# Patient Record
Sex: Female | Born: 1965 | Race: Black or African American | Hispanic: No | Marital: Married | Smoking: Never smoker
Health system: Southern US, Community
[De-identification: ages and names within clinical notes are randomized; demographics above are authoritative.]

## PROBLEM LIST (undated history)

## (undated) DIAGNOSIS — G8929 Other chronic pain: Secondary | ICD-10-CM

## (undated) DIAGNOSIS — R232 Flushing: Secondary | ICD-10-CM

## (undated) DIAGNOSIS — R519 Headache, unspecified: Secondary | ICD-10-CM

## (undated) DIAGNOSIS — F419 Anxiety disorder, unspecified: Secondary | ICD-10-CM

## (undated) DIAGNOSIS — R5383 Other fatigue: Secondary | ICD-10-CM

## (undated) DIAGNOSIS — C50211 Malignant neoplasm of upper-inner quadrant of right female breast: Secondary | ICD-10-CM

## (undated) DIAGNOSIS — C50919 Malignant neoplasm of unspecified site of unspecified female breast: Secondary | ICD-10-CM

## (undated) DIAGNOSIS — F32A Depression, unspecified: Secondary | ICD-10-CM

## (undated) DIAGNOSIS — B379 Candidiasis, unspecified: Secondary | ICD-10-CM

## (undated) DIAGNOSIS — R51 Headache: Secondary | ICD-10-CM

## (undated) DIAGNOSIS — D649 Anemia, unspecified: Secondary | ICD-10-CM

## (undated) DIAGNOSIS — I10 Essential (primary) hypertension: Secondary | ICD-10-CM

## (undated) DIAGNOSIS — F329 Major depressive disorder, single episode, unspecified: Secondary | ICD-10-CM

## (undated) HISTORY — DX: Malignant neoplasm of upper-inner quadrant of right female breast: C50.211

## (undated) HISTORY — DX: Flushing: R23.2

## (undated) HISTORY — DX: Anxiety disorder, unspecified: F41.9

## (undated) HISTORY — DX: Other chronic pain: G89.29

## (undated) HISTORY — PX: GASTRIC BYPASS: SHX52

## (undated) HISTORY — DX: Candidiasis, unspecified: B37.9

## (undated) HISTORY — PX: TUBAL LIGATION: SHX77

## (undated) HISTORY — PX: MASTECTOMY W/ SENTINEL NODE BIOPSY: SHX2001

## (undated) HISTORY — DX: Anemia, unspecified: D64.9

## (undated) HISTORY — PX: COSMETIC SURGERY: SHX468

## (undated) HISTORY — DX: Headache: R51

## (undated) HISTORY — DX: Other fatigue: R53.83

## (undated) HISTORY — DX: Headache, unspecified: R51.9

## (undated) HISTORY — PX: CHOLECYSTECTOMY: SHX55

---

## 1998-08-07 ENCOUNTER — Emergency Department (HOSPITAL_COMMUNITY): Admission: EM | Admit: 1998-08-07 | Discharge: 1998-08-07 | Payer: Self-pay | Admitting: Emergency Medicine

## 1998-08-07 ENCOUNTER — Ambulatory Visit (HOSPITAL_COMMUNITY): Admission: RE | Admit: 1998-08-07 | Discharge: 1998-08-07 | Payer: Self-pay

## 1998-08-25 ENCOUNTER — Ambulatory Visit (HOSPITAL_COMMUNITY): Admission: RE | Admit: 1998-08-25 | Discharge: 1998-08-26 | Payer: Self-pay | Admitting: Surgery

## 2004-12-16 ENCOUNTER — Encounter: Admission: RE | Admit: 2004-12-16 | Discharge: 2004-12-16 | Payer: Self-pay | Admitting: Obstetrics and Gynecology

## 2005-01-11 ENCOUNTER — Encounter: Admission: RE | Admit: 2005-01-11 | Discharge: 2005-01-11 | Payer: Self-pay | Admitting: Obstetrics and Gynecology

## 2005-08-10 ENCOUNTER — Encounter: Admission: RE | Admit: 2005-08-10 | Discharge: 2005-08-10 | Payer: Self-pay | Admitting: Obstetrics and Gynecology

## 2007-03-20 ENCOUNTER — Encounter: Admission: RE | Admit: 2007-03-20 | Discharge: 2007-03-20 | Payer: Self-pay | Admitting: Obstetrics and Gynecology

## 2008-07-22 ENCOUNTER — Encounter: Admission: RE | Admit: 2008-07-22 | Discharge: 2008-07-22 | Payer: Self-pay | Admitting: Obstetrics and Gynecology

## 2008-08-20 ENCOUNTER — Encounter: Admission: RE | Admit: 2008-08-20 | Discharge: 2008-08-20 | Payer: Self-pay | Admitting: Gastroenterology

## 2009-07-23 ENCOUNTER — Encounter: Admission: RE | Admit: 2009-07-23 | Discharge: 2009-07-23 | Payer: Self-pay | Admitting: Obstetrics and Gynecology

## 2011-01-18 ENCOUNTER — Ambulatory Visit (INDEPENDENT_AMBULATORY_CARE_PROVIDER_SITE_OTHER): Payer: BC Managed Care – PPO

## 2011-01-18 DIAGNOSIS — R509 Fever, unspecified: Secondary | ICD-10-CM

## 2011-01-18 DIAGNOSIS — R05 Cough: Secondary | ICD-10-CM

## 2011-01-18 DIAGNOSIS — G44209 Tension-type headache, unspecified, not intractable: Secondary | ICD-10-CM

## 2011-09-26 ENCOUNTER — Ambulatory Visit (INDEPENDENT_AMBULATORY_CARE_PROVIDER_SITE_OTHER): Payer: BC Managed Care – PPO | Admitting: Family Medicine

## 2011-09-26 ENCOUNTER — Ambulatory Visit: Payer: BC Managed Care – PPO

## 2011-09-26 VITALS — BP 126/90 | HR 56 | Temp 97.8°F | Resp 16 | Ht 71.0 in | Wt 226.0 lb

## 2011-09-26 DIAGNOSIS — R519 Headache, unspecified: Secondary | ICD-10-CM

## 2011-09-26 DIAGNOSIS — R51 Headache: Secondary | ICD-10-CM

## 2011-09-26 MED ORDER — HYDROCODONE-ACETAMINOPHEN 5-325 MG PO TABS: 1.0000 | ORAL_TABLET | Freq: Four times a day (QID) | ORAL | Status: DC | PRN

## 2011-09-26 NOTE — Patient Instructions (Signed)
Your headache may be due to a number of causes including stress headache, migraine, sleep deprivation, or sinus disease.  We will have an x ray report in the next 1-2 days. You can take the pain medicine up to every 6 hours for the next few days.  This can make you sleepy, so do not take this while working or driving. Try to rest today, relaxation techniques for stress management.  If your headache is not improved in next 2 - 3 days, return for recheck and discussion of next step, including possible ct scan or neurology referral. Return to the clinic or go to the nearest emergency room if any of your symptoms worsen or new symptoms occur.

## 2011-09-26 NOTE — Progress Notes (Signed)
Subjective:    Patient ID: Brooke [name], female    DOB: [birth year], 46 y.o.   MRN: [id number]  HPI Brooke [name] is a 46 y.o. female Headaches - coming and going for past month. No history of headaches prior to this.  Worse on L side of face.  Current HA since yesterday.  Worse one. No vision change, no diplopia, no N/V.  Bright light and loud sound bother it.  No fever, no neck pain or stiffness.  No recent illness. Tx: advil otc. Min relief. Whole head hurts, but pain behind L eye - eye hurts.  No eye watering or runny nose.   More stress recently with work, kids, husband. No known hx of depression, anxiety.  Trouble getting to sleep and staying asleep, too many things on her mind, not getting peaceful sleep.  Tired all the time.    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  Social research officer, government at [employer]   Sister in law had tumor in brain - no genetic relatives with brain tumors.    Review of Systems  Constitutional: Negative for fever and chills.  HENT: Negative for congestion, sneezing, neck pain, neck stiffness and postnasal drip.   Eyes: Positive for photophobia and pain. Negative for discharge, redness and visual disturbance.  Respiratory: Negative for cough and shortness of breath.   Neurological: Positive for headaches. Negative for dizziness, facial asymmetry, speech difficulty and light-headedness.       Objective:   Physical Exam  Constitutional: She is oriented to person, place, and time. She appears well-developed and well-nourished.       Lights in room off.  Appears uncomfortable, but nontoxic.  HENT:  Head: Normocephalic and atraumatic.  Right Ear: External ear normal.  Left Ear: External ear normal.  Nose: Right sinus exhibits frontal sinus tenderness. Left sinus exhibits frontal sinus tenderness.       Guarded with percussion exam of sinuses, but only admits to L greater than R frontal ttp. No rash.  Eyes: EOM are normal. Pupils are equal, round, and reactive to  light. Right eye exhibits no nystagmus. Left eye exhibits no nystagmus.  Neck: Normal range of motion. Neck supple.  Cardiovascular: Normal rate, regular rhythm, normal heart sounds and intact distal pulses.   Pulmonary/Chest: Effort normal and breath sounds normal.  Musculoskeletal: Normal range of motion.  Neurological: She is alert and oriented to person, place, and time. She has normal strength. She displays no atrophy and no tremor. No sensory deficit. She exhibits normal muscle tone. She displays a negative Romberg sign. She displays no seizure activity. Coordination and gait normal.       No pronator drift. Normal finger to nose and heel to toe. Normal speech. No focal weakness.    Skin: Skin is warm and dry. No rash noted.  Psychiatric: She has a normal mood and affect. Her behavior is normal.     [facility] reading (PRIMARY) by  Dr. 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        Assessment & Plan:  Brooke [name] is a 46 y.o. female 1. Frontal headache  DG Sinuses Complete, HYDROcodone-acetaminophen (NORCO/VICODIN) 5-325 MG per tablet  2. Face pain      Headache likely multifactorial. Nonfoacal neuro exam.  DDX stress headache, sinus disease, cluster, or migraine.  Trial of pain med, sleep today, stress mgt techniques, and recheck next 2-3 days if not improved, sooner if worse. rtc precautions given, oow note for today. Discussed ct  scan, but deferred until follow up if not improved.   Patient Instructions  Your headache may be due to a number of causes including stress headache, migraine, sleep deprivation, or sinus disease.  We will have an x ray report in the next 1-2 days. You can take the pain medicine up to every 6 hours for the next few days.  This can make you sleepy, so do not take this while working or driving. Try to rest today, relaxation techniques for stress management.  If your headache is not improved in next 2 - 3 days, return for recheck and discussion of  next step, including possible ct scan or neurology referral. Return to the clinic or go to the nearest emergency room if any of your symptoms worsen or new symptoms occur.

## 2012-02-02 ENCOUNTER — Ambulatory Visit: Payer: BC Managed Care – PPO | Admitting: Obstetrics and Gynecology

## 2012-02-02 ENCOUNTER — Encounter: Payer: Self-pay | Admitting: Obstetrics and Gynecology

## 2012-02-02 VITALS — BP 122/70 | Ht 70.0 in | Wt 233.0 lb

## 2012-02-02 DIAGNOSIS — Z202 Contact with and (suspected) exposure to infections with a predominantly sexual mode of transmission: Secondary | ICD-10-CM

## 2012-02-02 DIAGNOSIS — G8929 Other chronic pain: Secondary | ICD-10-CM | POA: Insufficient documentation

## 2012-02-02 DIAGNOSIS — Z124 Encounter for screening for malignant neoplasm of cervix: Secondary | ICD-10-CM

## 2012-02-02 DIAGNOSIS — R5383 Other fatigue: Secondary | ICD-10-CM | POA: Insufficient documentation

## 2012-02-02 DIAGNOSIS — Z1231 Encounter for screening mammogram for malignant neoplasm of breast: Secondary | ICD-10-CM

## 2012-02-02 DIAGNOSIS — B379 Candidiasis, unspecified: Secondary | ICD-10-CM | POA: Insufficient documentation

## 2012-02-02 DIAGNOSIS — F53 Postpartum depression: Secondary | ICD-10-CM | POA: Insufficient documentation

## 2012-02-02 LAB — HEMOGLOBIN: Hemoglobin: 11.7 g/dL — ABNORMAL LOW (ref 12.0–15.0)

## 2012-02-02 NOTE — Progress Notes (Signed)
Subjective:  NEW PT AEX  Brooke [name] is a 47 y.o. female, G4P0010, who presents for an annual exam. She has had recurrent sx of vaginal itching always responds to OTC or prescribed antifungals.   Last Pap: per pt  2012 WNL: Yes Regular Periods:no  Contraception: Mirena  Monthly Breast exam:no Tetanus<75yrs:no Nl.Bladder Function:yes Daily BMs:no Healthy Diet:no Calcium:no Mammogram:no Date of Mammogram: 07/24/2009 Normal Exercise:no Have often Exercise: none Seatbelt: yes Abuse at home: no Stressful work:yes Sigmoid-colonoscopy: n/a Bone Density: No PCP: none Change in PMH: none  Change in ONG:EXBM   History   Social History  . Marital Status: Married    Spouse Name: N/A    Number of Children: N/A  . Years of Education: N/A   Social History Main Topics  . Smoking status: Never Smoker   . Smokeless tobacco: Never Used  . Alcohol Use: 2.0 - 2.5 oz/week    4-5 drink(s) per week  . Drug Use: No  . Sexually Active: Yes    Birth Control/ Protection: Surgical   Other Topics Concern  . None   Social History Narrative  . None    Menstrual cycle:   LMP: Patient's last menstrual period was 01/25/2012.           Cycle: regular without IM bleeding  The following portions of the patient's history were reviewed and updated as appropriate: allergies, current medications, past family history, past medical history, past social history, past surgical history and problem list.  Review of Systems Pertinent items are noted in HPI. Breast:Negative for breast lump,nipple discharge or nipple retraction Gastrointestinal: Negative for abdominal pain, change in bowel habits or rectal bleeding Urinary:negative   Objective:    BP 122/70  Ht 5\' 10"  (1.778 m)  Wt 233 lb (105.688 kg)  BMI 33.43 kg/m2  LMP 01/25/2012    Weight:  Wt Readings from Last 1 Encounters:  02/02/12 233 lb (105.688 kg)          BMI: Body mass index is 33.43 kg/(m^2).  General Appearance: Alert,  appropriate appearance for age. No acute distress HEENT: Grossly normal Neck / Thyroid: Supple, no masses, nodes or enlargement Lungs: clear to auscultation bilaterally Back: No CVA tenderness Breast Exam: No masses or nodes.No dimpling, nipple retraction or discharge. Cardiovascular: Regular rate and rhythm. S1, S2, no murmur Gastrointestinal: Soft, non-tender, no masses or organomegaly Pelvic Exam: Vulva and vagina appear normal. Bimanual exam reveals normal uterus and adnexa. Rectovaginal: normal rectal, no masses Lymphatic Exam: Non-palpable nodes in neck, clavicular, axillary, or inguinal regions Skin: no rash or abnormalities Neurologic: Normal gait and speech, no tremor  Psychiatric: Alert and oriented, appropriate affect.   Wet Prep:not applicable Urinalysis:not applicable UPT: Not done   Assessment:    Normal gyn exam  Requests STD testing History of recurrent vaginitis and asymptomatic at this time.   Plan:   Hemoglobin A1c Hemoglobin TSH mammogram pap smear return annually or prn STD screening: done Contraception:IUD     Dierdre Forth MD

## 2012-02-03 LAB — HSV 2 ANTIBODY, IGG: HSV 2 Glycoprotein G Ab, IgG: 7.51 IV — ABNORMAL HIGH

## 2012-02-03 LAB — HEMOGLOBIN A1C: Hgb A1c MFr Bld: 6.2 % — ABNORMAL HIGH (ref <5.7)

## 2012-02-03 LAB — HSV 1 ANTIBODY, IGG: HSV 1 Glycoprotein G Ab, IgG: 8.56 IV — ABNORMAL HIGH

## 2012-02-06 LAB — PAP IG, CT-NG NAA, HPV HIGH-RISK

## 2012-02-15 ENCOUNTER — Telehealth: Payer: Self-pay | Admitting: Obstetrics and Gynecology

## 2012-02-15 NOTE — Telephone Encounter (Signed)
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 This may explain recurrent yeast. Needs appt with PCP for plan of care    hemglobin (blood count, Iron level) essentially normal    Thyroid, pap smear and syphilis tests all normal

## 2012-02-15 NOTE — Telephone Encounter (Signed)
[facility] pt 

## 2012-02-17 MED ORDER — VALACYCLOVIR HCL 500 MG PO TABS: 500.0000 mg | ORAL_TABLET | Freq: Every day | ORAL | Status: AC

## 2012-02-17 NOTE — Telephone Encounter (Signed)
Tc from pt. Informed pt of [facility] recs rgdg labs. PCP information/phone numbers given to pt to establish care. 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 Rx for Valtrex e-pres to pharm on file. Pt voices understanding.

## 2012-02-17 NOTE — Telephone Encounter (Signed)
Lm on vm to cb per [facility] recs.  

## 2012-05-03 ENCOUNTER — Ambulatory Visit: Payer: BC Managed Care – PPO

## 2012-05-03 ENCOUNTER — Ambulatory Visit (INDEPENDENT_AMBULATORY_CARE_PROVIDER_SITE_OTHER): Payer: BC Managed Care – PPO | Admitting: Emergency Medicine

## 2012-05-03 VITALS — BP 108/73 | HR 84 | Temp 98.3°F | Resp 18 | Wt 229.0 lb

## 2012-05-03 DIAGNOSIS — R1013 Epigastric pain: Secondary | ICD-10-CM

## 2012-05-03 DIAGNOSIS — M25562 Pain in left knee: Secondary | ICD-10-CM

## 2012-05-03 DIAGNOSIS — M25569 Pain in unspecified knee: Secondary | ICD-10-CM

## 2012-05-03 LAB — POCT URINE PREGNANCY: Preg Test, Ur: NEGATIVE

## 2012-05-03 LAB — POCT UA - MICROSCOPIC ONLY
Mucus, UA: NEGATIVE
Yeast, UA: NEGATIVE

## 2012-05-03 LAB — POCT URINALYSIS DIPSTICK
Bilirubin, UA: NEGATIVE
Ketones, UA: NEGATIVE
Nitrite, UA: POSITIVE
Spec Grav, UA: 1.015

## 2012-05-03 NOTE — Progress Notes (Signed)
  Subjective:    Patient ID: Brooke [name], female    DOB: 1965/01/07, 47 y.o.   MRN: [id number]  HPI 47 yo female with complaints of uncomfortable feeling in stomach. Has changed diet to eat more salads and lean meat, but has been having constipation and straining with defecation. It is painful after bowel movement. Has history of roux-en-y surgery in 2004.  Bowels have never been regular. No family history of colon issues.   Also reports she is having trouble with both knees. Has been trying to run, but yesterday it hurt too badly.   Is not pregnant, tubal ligation 14 years ago.   Review of Systems  Gastrointestinal: Positive for constipation and rectal pain. Negative for diarrhea and blood in stool.  Musculoskeletal: Positive for arthralgias (Both knees ).  Skin: Positive for rash.       Objective:   Physical Exam  Constitutional: She appears well-developed and well-nourished.  HENT:  Head: Normocephalic and atraumatic.  Cardiovascular: Normal rate, regular rhythm and normal heart sounds.   Pulmonary/Chest: Effort normal and breath sounds normal.  Abdominal: Bowel sounds are normal. She exhibits mass (most likely stool).  Skin: Skin is warm and dry.  Occasional scaly plaque on back   [facility] reading (PRIMARY) by  Dr. Cleta Alberts There is a  large air accumulation in the left upper abdomen but I do not see any air-fluid levels and no free air and chest x-ray looks normal. Knee films shows degenerative changes but is otherwise within normal limits there is bilateral narrowing of the joint space.         Assessment & Plan:  I started her on MiraLax. Have encouraged her to pick up either Citrucel or Metamucil. Her x-ray of the left knee is bone-on-bone she should change her exercise program to non-impact bearing exercises.

## 2012-05-03 NOTE — Patient Instructions (Signed)
Degenerative Arthritis You have osteoarthritis. This is the wear and tear arthritis that comes with aging. It is also called degenerative arthritis. This is common in people past middle age. It is caused by stress on the joints. The large weight bearing joints of the lower extremities are most often affected. The knees, hips, back, neck, and hands can become painful, swollen, and stiff. This is the most common type of arthritis. It comes on with age, carrying too much weight, or from an injury. Treatment includes resting the sore joint until the pain and swelling improve. Crutches or a walker may be needed for severe flares. Only take over-the-counter or prescription medicines for pain, discomfort, or fever as directed by your caregiver. Local heat therapy may improve motion. Cortisone shots into the joint are sometimes used to reduce pain and swelling during flares. Osteoarthritis is usually not crippling and progresses slowly. There are things you can do to decrease pain:  Avoid high impact activities.  Exercise regularly.  Low impact exercises such as walking, biking and swimming help to keep the muscles strong and keep normal joint function.  Stretching helps to keep your range of motion.  Lose weight if you are overweight. This reduces joint stress. In severe cases when you have pain at rest or increasing disability, joint surgery may be helpful. See your caregiver for follow-up treatment as recommended.  SEEK IMMEDIATE MEDICAL CARE IF:   You have severe joint pain.  Marked swelling and redness in your joint develops.  You develop a high fever. Document Released: 12/20/2004 Document Revised: 03/14/2011 Document Reviewed: 05/22/2006 [facility] Patient Information 2013 [place], [place]. Constipation, Adult Constipation is when a person has fewer than 3 bowel movements a week; has difficulty having a bowel movement; or has stools that are dry, hard, or larger than normal. As people grow  older, constipation is more common. If you try to fix constipation with medicines that make you have a bowel movement (laxatives), the problem may get worse. Long-term laxative use may cause the muscles of the colon to become weak. A low-fiber diet, not taking in enough fluids, and taking certain medicines may make constipation worse. CAUSES   Certain medicines, such as antidepressants, pain medicine, iron supplements, antacids, and water pills.   Certain diseases, such as diabetes, irritable bowel syndrome (IBS), thyroid disease, or depression.   Not drinking enough water.   Not eating enough fiber-rich foods.   Stress or travel.  Lack of physical activity or exercise.  Not going to the restroom when there is the urge to have a bowel movement.  Ignoring the urge to have a bowel movement.  Using laxatives too much. SYMPTOMS   Having fewer than 3 bowel movements a week.   Straining to have a bowel movement.   Having hard, dry, or larger than normal stools.   Feeling full or bloated.   Pain in the lower abdomen.  Not feeling relief after having a bowel movement. DIAGNOSIS  Your caregiver will take a medical history and perform a physical exam. Further testing may be done for severe constipation. Some tests may include:   A barium enema X-ray to examine your rectum, colon, and sometimes, your small intestine.  A sigmoidoscopy to examine your lower colon.  A colonoscopy to examine your entire colon. TREATMENT  Treatment will depend on the severity of your constipation and what is causing it. Some dietary treatments include drinking more fluids and eating more fiber-rich foods. Lifestyle treatments may include regular exercise. If these  diet and lifestyle recommendations do not help, your caregiver may recommend taking over-the-counter laxative medicines to help you have bowel movements. Prescription medicines may be prescribed if over-the-counter medicines do not work.   HOME CARE INSTRUCTIONS   Increase dietary fiber in your diet, such as fruits, vegetables, whole grains, and beans. Limit high-fat and processed sugars in your diet, such as [place] fries, hamburgers, cookies, candies, and soda.   A fiber supplement may be added to your diet if you cannot get enough fiber from foods.   Drink enough fluids to keep your urine clear or pale yellow.   Exercise regularly or as directed by your caregiver.   Go to the restroom when you have the urge to go. Do not hold it.  Only take medicines as directed by your caregiver. Do not take other medicines for constipation without talking to your caregiver first. SEEK IMMEDIATE MEDICAL CARE IF:   You have bright red blood in your stool.   Your constipation lasts for more than 4 days or gets worse.   You have abdominal or rectal pain.   You have thin, pencil-like stools.  You have unexplained weight loss. MAKE SURE YOU:   Understand these instructions.  Will watch your condition.  Will get help right away if you are not doing well or get worse. Document Released: 09/18/2003 Document Revised: 03/14/2011 Document Reviewed: 11/23/2010 [facility] Patient Information 2013 [place], [place].

## 2012-06-04 ENCOUNTER — Ambulatory Visit (INDEPENDENT_AMBULATORY_CARE_PROVIDER_SITE_OTHER): Payer: BC Managed Care – PPO | Admitting: Family Medicine

## 2012-06-04 ENCOUNTER — Ambulatory Visit: Payer: BC Managed Care – PPO

## 2012-06-04 VITALS — BP 142/88 | HR 66 | Temp 98.3°F | Resp 16 | Ht 70.0 in | Wt 223.0 lb

## 2012-06-04 DIAGNOSIS — M25569 Pain in unspecified knee: Secondary | ICD-10-CM

## 2012-06-04 DIAGNOSIS — M25561 Pain in right knee: Secondary | ICD-10-CM

## 2012-06-04 MED ORDER — MELOXICAM 7.5 MG PO TABS: ORAL_TABLET | ORAL | Status: DC

## 2012-06-04 MED ORDER — TRAMADOL HCL 50 MG PO TABS: 50.0000 mg | ORAL_TABLET | Freq: Three times a day (TID) | ORAL | Status: DC | PRN

## 2012-06-04 NOTE — Progress Notes (Signed)
[facility] [address], [place] [address] [phone]  Date:  06/04/2012   Name:  Brooke [name]   DOB:  1965/04/16   MRN:  [id number]  PCP:  Pcp Not In System    Chief Complaint: Joint Swelling   History of Present Illness:  Brooke [name] is a 47 y.o. very pleasant female patient who presents with the following:  She is here today with a right knee injury.  She was exercising at the gym last week- she did not have any acute injury, but did notice pain while doing an exercise on a step.  That evening the knee was swollen.  She has used ice, and the knee is better but is still puffy and sore.  She has had some pain in the past- when she had started running- but lately it had been ok. No clicking of popping unless she steps down on it after sitting for a long time.   No getting stuck.  It can feel unstable, but has not yet given out on her.   LMP was last month- she is s/p BTL  Patient Active Problem List   Diagnosis Date Noted  . Depression, postpartum   . Yeast infection   . Fatigue   . Chronic headaches     Past Medical History  Diagnosis Date  . Anemia   . Depression, postpartum   . Yeast infection   . Fatigue   . Chronic headaches   . Anxiety     Past Surgical History  Procedure Laterality Date  . Gastric bypass    . Tubal ligation    . Cholecystectomy      History  Substance Use Topics  . Smoking status: Never Smoker   . Smokeless tobacco: Never Used  . Alcohol Use: 2 - 2.5 oz/week    4-5 drink(s) per week    Family History  Problem Relation Age of Onset  . Diabetes Mother   . Hypertension Mother   . Spinal muscular atrophy Mother   . Irregular heart beat Mother   . Anxiety disorder Mother   . Arthritis Mother   . Cancer Other     pancreatic    No Known Allergies  Medication list has been reviewed and updated.  Current Outpatient Prescriptions on File Prior to Visit  Medication Sig Dispense Refill  .  HYDROcodone-acetaminophen (NORCO/VICODIN) 5-325 MG per tablet Take 1 tablet by mouth every 6 (six) hours as needed for pain.  15 tablet  0   No current facility-administered medications on file prior to visit.    Review of Systems:  As per HPI- otherwise negative.   Physical Examination: Filed Vitals:   06/04/12 1244  BP: 142/88  Pulse: 66  Temp: 98.3 F (36.8 C)  Resp: 16   Filed Vitals:   06/04/12 1244  Height: 5\' 10"  (1.778 m)  Weight: 223 lb (101.152 kg)   Body mass index is 32 kg/(m^2). Ideal Body Weight: Weight in (lb) to have BMI = 25: 173.9  GEN: WDWN, NAD, Non-toxic, A & O x 3 HEENT: Atraumatic, Normocephalic. Neck supple. No masses, No LAD. Ears and Nose: No external deformity. CV: RRR, No M/G/R. No JVD. No thrill. No extra heart sounds. PULM: CTA B, no wheezes, crackles, rhonchi. No retractions. No resp. distress. No accessory muscle use. ABD: S, NT, ND, +BS. No rebound. No HSM. EXTR: No c/c/e NEURO Normal gait.  PSYCH: Normally interactive. Conversant. Not depressed or anxious appearing.  Calm demeanor.  Right knee: mild tenderness at the medial joint line, and a small effusion.  Ligaments are stable, no crepitus.  Good flexion and extension.    [facility] reading (PRIMARY) by  Dr. Patsy Lager. Right knee; mild degenerative change, especially under the patella   RIGHT KNEE - COMPLETE 4+ VIEW  Comparison: None.  Findings: Moderate degenerative changes are most evident in the medial and patellofemoral compartments of the knee. No acute osseous abnormality is evident. The knee is located. A moderate sized joint effusion is present.  IMPRESSION:  1. Moderate sized joint effusion without acute osseous abnormality. Internal derangement is not excluded. 2. Mild degenerative change is most evident in the medial and patellofemoral joints.  Clinically significant discrepancy from primary report, if provided: Moderate joint effusion.  These results will be called to  the ordering clinician or representative by the Radiologist Assistant, and communication documented in the PACS Dashboard.   Assessment and Plan: Pain in knee, right - Plan: DG Knee Complete 4 Views Right, meloxicam (MOBIC) 7.5 MG tablet, traMADol (ULTRAM) 50 MG tablet  Knee pain, ligaments are stable.  ?small meniscal tear vs sprain.  Will ice, elevate, use knee brace as needed mobic for day, tramadol for night as needed.  If not better in the next few days please let me know- Sooner if worse.  Called her and let her know that the O/R report does mention an effusion.  This increases the chance that we are dealing with a cartilage tear.  She will let me know if she is not better in a few days   Signed Abbe Amsterdam, MD

## 2012-06-04 NOTE — Patient Instructions (Addendum)
Use the knee brace as needed for support while walking.  Use the mobic as needed for pain and inflammation.  You can also use the tramadol, but it can make you sleepy.   Ice and elevated your knee as needed. If your knee is not better in about one week please give me a call

## 2013-05-03 ENCOUNTER — Ambulatory Visit (INDEPENDENT_AMBULATORY_CARE_PROVIDER_SITE_OTHER): Payer: BC Managed Care – PPO | Admitting: Family Medicine

## 2013-05-03 ENCOUNTER — Telehealth: Payer: Self-pay

## 2013-05-03 VITALS — BP 120/80 | HR 65 | Temp 98.7°F | Resp 16 | Ht 71.0 in | Wt 241.0 lb

## 2013-05-03 DIAGNOSIS — Z23 Encounter for immunization: Secondary | ICD-10-CM

## 2013-05-03 DIAGNOSIS — S61409A Unspecified open wound of unspecified hand, initial encounter: Secondary | ICD-10-CM

## 2013-05-03 DIAGNOSIS — M79609 Pain in unspecified limb: Secondary | ICD-10-CM

## 2013-05-03 DIAGNOSIS — M79643 Pain in unspecified hand: Secondary | ICD-10-CM

## 2013-05-03 MED ORDER — DICLOFENAC SODIUM 75 MG PO TBEC: 75.0000 mg | DELAYED_RELEASE_TABLET | Freq: Two times a day (BID) | ORAL | Status: DC

## 2013-05-03 NOTE — Progress Notes (Signed)
Subjective:    Patient ID: Brooke [name], female    DOB: Nov 25, 1965, 48 y.o.   MRN: [id number]  HPI Brooke [name] is a 48 y.o. female  Opening wine bottle last night at around 9pm with waiter's corkscrew.  Glass cut R third finger.  Rinsed and wrapped.  Tx: neosporin.   Last tetanus unknown, thinks may have been more than 5 years ago.   R hand dominant.   Press photographer at [employer].     Patient Active Problem List   Diagnosis Date Noted  . Depression, postpartum   . Yeast infection   . Fatigue   . Chronic headaches    Past Medical History  Diagnosis Date  . Anemia   . Depression, postpartum   . Yeast infection   . Fatigue   . Chronic headaches   . Anxiety    Past Surgical History  Procedure Laterality Date  . Gastric bypass    . Tubal ligation    . Cholecystectomy     No Known Allergies Prior to Admission medications   Medication Sig Start Date End Date Taking? Authorizing Provider  meloxicam (MOBIC) 7.5 MG tablet Take one or two daily for pain and inflammation 06/04/12   Gay Filler Copland, MD  traMADol (ULTRAM) 50 MG tablet Take 1 tablet (50 mg total) by mouth every 8 (eight) hours as needed for pain. 06/04/12   Darreld Mclean, MD   History   Social History  . Marital Status: Married    Spouse Name: N/A    Number of Children: N/A  . Years of Education: N/A   Occupational History  . Not on file.   Social History Main Topics  . Smoking status: Never Smoker   . Smokeless tobacco: Never Used  . Alcohol Use: 2.0 - 2.5 oz/week    4-5 drink(s) per week  . Drug Use: No  . Sexual Activity: Yes    Birth Control/ Protection: Surgical   Other Topics Concern  . Not on file   Social History Narrative  . No narrative on file    Review of Systems  Constitutional: Negative for fever.  Musculoskeletal: Positive for arthralgias. Negative for joint swelling.  Skin: Negative for color change.       Objective:   Physical Exam    Vitals reviewed. Constitutional: She is oriented to person, place, and time. She appears well-developed and well-nourished. No distress.  Pulmonary/Chest: Effort normal.  Musculoskeletal:       Right hand: She exhibits laceration (1.2CM LAC volar/pad of 3rd R phalynx. NVI distally, cap refill less than 1 second. ). She exhibits normal range of motion. Normal sensation noted. Normal strength noted.       Hands: Neurological: She is alert and oriented to person, place, and time.  Skin: Skin is warm and dry.  Psychiatric: She has a normal mood and affect. Her behavior is normal.   Filed Vitals:   05/03/13 0902  BP: 120/80  Pulse: 65  Temp: 98.7 F (37.1 C)  TempSrc: Oral  Resp: 16  Height: 5\' 11"  (1.803 m)  Weight: 241 lb (109.317 kg)  SpO2: 98%        Assessment & Plan:  Brooke [name] is a 48 y.o. female Pain, hand  Open wound, hand  Need for Tdap vaccination  Wound repaired per procedure note. Wound care precautions. Tdap updated. Tylenol or otc ibuprofen if needed for pain.   No orders of the defined  types were placed in this encounter.   Patient Instructions  Return to the clinic or go to the nearest emergency room if any of your symptoms worsen or new symptoms occur.  WOUND CARE Please return in 7-10 days to have your stitches/staples removed or sooner if you have concerns. Marland Kitchen Keep area clean and dry for 24 hours. Do not remove bandage, if applied. . After 24 hours, remove bandage and wash wound gently with mild soap and warm water. Reapply a new bandage after cleaning wound, if directed. . Continue daily cleansing with soap and water until stitches/staples are removed. . Do not apply any ointments or creams to the wound while stitches/staples are in place, as this may cause delayed healing. . Notify the office if you experience any of the following signs of infection: Swelling, redness, pus drainage, streaking, fever >101.0 F . Notify the office if you  experience excessive bleeding that does not stop after 15-20 minutes of constant, firm pressure.

## 2013-05-03 NOTE — Telephone Encounter (Signed)
I will send in a medication called Voltaren which will help the pain but not cause her to be sleepy.

## 2013-05-03 NOTE — Telephone Encounter (Signed)
Spoke to patient.  Voltaren called in and it should help with pain but not cause drowsiness.  Patient was very grateful.

## 2013-05-03 NOTE — Telephone Encounter (Signed)
Patient is requesting pain medication.  Her hand is throbbing from the procedure by Windell Hummingbird this morning.    [facility] [place]:  [phone]

## 2013-05-03 NOTE — Telephone Encounter (Signed)
LMVM to CB. 

## 2013-05-03 NOTE — Patient Instructions (Signed)
Return to the clinic or go to the nearest emergency room if any of your symptoms worsen or new symptoms occur.  WOUND CARE Please return in 7-10 days to have your stitches/staples removed or sooner if you have concerns. Marland Kitchen Keep area clean and dry for 24 hours. Do not remove bandage, if applied. . After 24 hours, remove bandage and wash wound gently with mild soap and warm water. Reapply a new bandage after cleaning wound, if directed. . Continue daily cleansing with soap and water until stitches/staples are removed. . Do not apply any ointments or creams to the wound while stitches/staples are in place, as this may cause delayed healing. . Notify the office if you experience any of the following signs of infection: Swelling, redness, pus drainage, streaking, fever >101.0 F . Notify the office if you experience excessive bleeding that does not stop after 15-20 minutes of constant, firm pressure.

## 2013-05-03 NOTE — Progress Notes (Signed)
Consent obtained.  MC block with 1% lidocaine.  Anesthesia achieved.  Wound cleaned and penrose drain used to achieve hemostasis.  Wound closed with 5-0 Ethilon #3 horizontal and #2 Si sutures.  Drsg placed and wound care d/w pt.

## 2013-05-13 ENCOUNTER — Ambulatory Visit (INDEPENDENT_AMBULATORY_CARE_PROVIDER_SITE_OTHER): Payer: BC Managed Care – PPO | Admitting: Physician Assistant

## 2013-05-13 VITALS — BP 120/74 | HR 76 | Temp 98.5°F | Resp 18

## 2013-05-13 DIAGNOSIS — Z4802 Encounter for removal of sutures: Secondary | ICD-10-CM

## 2013-05-13 NOTE — Progress Notes (Signed)
   Subjective:    Patient ID: Brooke [name], female    DOB: [birth year], 48 y.o.   MRN: [id number]  Suture / Staple Removal     Brooke [name] is a very pleasant 48 yr old female here for suture removal.  Sutures placed here 10 days ago.  Pt reports she is doing well.  Occ has some tenderness or throbbing if she bumps the finger. No redness, swelling, or drainage.     Review of Systems  Constitutional: Negative.   Respiratory: Negative.   Cardiovascular: Negative.   Skin: Positive for wound.       Objective:   Physical Exam  Vitals reviewed. Constitutional: She is oriented to person, place, and time. She appears well-developed and well-nourished. No distress.  Pulmonary/Chest: Effort normal.  Musculoskeletal:       Hands: Healing wound R 3rd finger; edges well approximated; sutures removed without difficulty  Neurological: She is alert and oriented to person, place, and time.  Skin: Skin is warm and dry.  Psychiatric: She has a normal mood and affect. Her behavior is normal.        Assessment & Plan:  Visit for suture removal   Brooke [name] is a very pleasant 48 yr old female here for suture removal.  Sutures removed without difficulty.  RTC if concerns    [name] MHS, PA-C [facility] 5/11/20156:07 PM

## 2014-01-27 ENCOUNTER — Ambulatory Visit (INDEPENDENT_AMBULATORY_CARE_PROVIDER_SITE_OTHER): Payer: BC Managed Care – PPO | Admitting: Family Medicine

## 2014-01-27 VITALS — BP 122/78 | HR 77 | Temp 97.7°F | Resp 18 | Ht 71.0 in | Wt 258.0 lb

## 2014-01-27 DIAGNOSIS — R059 Cough, unspecified: Secondary | ICD-10-CM

## 2014-01-27 DIAGNOSIS — H9203 Otalgia, bilateral: Secondary | ICD-10-CM

## 2014-01-27 DIAGNOSIS — J0100 Acute maxillary sinusitis, unspecified: Secondary | ICD-10-CM

## 2014-01-27 DIAGNOSIS — N912 Amenorrhea, unspecified: Secondary | ICD-10-CM

## 2014-01-27 DIAGNOSIS — R635 Abnormal weight gain: Secondary | ICD-10-CM

## 2014-01-27 DIAGNOSIS — R05 Cough: Secondary | ICD-10-CM

## 2014-01-27 DIAGNOSIS — E041 Nontoxic single thyroid nodule: Secondary | ICD-10-CM

## 2014-01-27 LAB — POCT URINE PREGNANCY: Preg Test, Ur: NEGATIVE

## 2014-01-27 MED ORDER — AMOXICILLIN-POT CLAVULANATE 875-125 MG PO TABS: 1.0000 | ORAL_TABLET | Freq: Two times a day (BID) | ORAL | Status: DC

## 2014-01-27 MED ORDER — HYDROCOD POLST-CHLORPHEN POLST 10-8 MG/5ML PO LQCR: 5.0000 mL | Freq: Two times a day (BID) | ORAL | Status: DC | PRN

## 2014-01-27 MED ORDER — FLUCONAZOLE 150 MG PO TABS: 150.0000 mg | ORAL_TABLET | Freq: Once | ORAL | Status: DC

## 2014-01-27 NOTE — Patient Instructions (Signed)
Return in 2 weeks to get Thyroid rechecked, if the thyr still feels full then we may need to get labs and also an ultrasound of your thyroid.     Sinusitis Sinusitis is redness, soreness, and inflammation of the paranasal sinuses. Paranasal sinuses are air pockets within the bones of your face (beneath the eyes, the middle of the forehead, or above the eyes). In healthy paranasal sinuses, mucus is able to drain out, and air is able to circulate through them by way of your nose. However, when your paranasal sinuses are inflamed, mucus and air can become trapped. This can allow bacteria and other germs to grow and cause infection. Sinusitis can develop quickly and last only a short time (acute) or continue over a long period (chronic). Sinusitis that lasts for more than 12 weeks is considered chronic.  CAUSES  Causes of sinusitis include:  Allergies.  Structural abnormalities, such as displacement of the cartilage that separates your nostrils (deviated septum), which can decrease the air flow through your nose and sinuses and affect sinus drainage.  Functional abnormalities, such as when the small hairs (cilia) that line your sinuses and help remove mucus do not work properly or are not present. SIGNS AND SYMPTOMS  Symptoms of acute and chronic sinusitis are the same. The primary symptoms are pain and pressure around the affected sinuses. Other symptoms include:  Upper toothache.  Earache.  Headache.  Bad breath.  Decreased sense of smell and taste.  A cough, which worsens when you are lying flat.  Fatigue.  Fever.  Thick drainage from your nose, which often is green and may contain pus (purulent).  Swelling and warmth over the affected sinuses. DIAGNOSIS  Your health care provider will perform a physical exam. During the exam, your health care provider may:  Look in your nose for signs of abnormal growths in your nostrils (nasal polyps).  Tap over the affected sinus to  check for signs of infection.  View the inside of your sinuses (endoscopy) using an imaging device that has a light attached (endoscope). If your health care provider suspects that you have chronic sinusitis, one or more of the following tests may be recommended:  Allergy tests.  Nasal culture. A sample of mucus is taken from your nose, sent to a lab, and screened for bacteria.  Nasal cytology. A sample of mucus is taken from your nose and examined by your health care provider to determine if your sinusitis is related to an allergy. TREATMENT  Most cases of acute sinusitis are related to a viral infection and will resolve on their own within 10 days. Sometimes medicines are prescribed to help relieve symptoms (pain medicine, decongestants, nasal steroid sprays, or saline sprays).  However, for sinusitis related to a bacterial infection, your health care provider will prescribe antibiotic medicines. These are medicines that will help kill the bacteria causing the infection.  Rarely, sinusitis is caused by a fungal infection. In theses cases, your health care provider will prescribe antifungal medicine. For some cases of chronic sinusitis, surgery is needed. Generally, these are cases in which sinusitis recurs more than 3 times per year, despite other treatments. HOME CARE INSTRUCTIONS   Drink plenty of water. Water helps thin the mucus so your sinuses can drain more easily.  Use a humidifier.  Inhale steam 3 to 4 times a day (for example, sit in the bathroom with the shower running).  Apply a warm, moist washcloth to your face 3 to 4 times a  day, or as directed by your health care provider.  Use saline nasal sprays to help moisten and clean your sinuses.  Take medicines only as directed by your health care provider.  If you were prescribed either an antibiotic or antifungal medicine, finish it all even if you start to feel better. SEEK IMMEDIATE MEDICAL CARE IF:  You have increasing  pain or severe headaches.  You have nausea, vomiting, or drowsiness.  You have swelling around your face.  You have vision problems.  You have a stiff neck.  You have difficulty breathing. MAKE SURE YOU:   Understand these instructions.  Will watch your condition.  Will get help right away if you are not doing well or get worse. Document Released: 12/20/2004 Document Revised: 05/06/2013 Document Reviewed: 01/04/2011 [facility] Patient Information 2015 [place], [place]. This information is not intended to replace advice given to you by your health care provider. Make sure you discuss any questions you have with your health care provider.  Lorcaserin oral tablets What is this medicine? LORCASERIN (lor ca SER in) is used to promote and maintain weight loss in obese patients. This medicine should be used with a reduced calorie diet and, if appropriate, an exercise program. This medicine may be used for other purposes; ask your health care provider or pharmacist if you have questions. COMMON BRAND NAME(S): Belviq What should I tell my health care provider before I take this medicine? They need to know if you have any of these conditions: -anatomical deformation of the penis, Peyronie's disease, or history of priapism (painful and prolonged erection) -diabetes -heart disease -history of blood diseases, like sickle cell anemia or leukemia -history of irregular heartbeat -kidney disease -liver disease -suicidal thoughts, plans, or attempt; a previous suicide attempt by you or a family member -an unusual or allergic reaction to lorcaserin, other medicines, foods, dyes, or preservatives -pregnant or trying to get pregnant -breast-feeding How should I use this medicine? Take this medicine by mouth with a glass of water. Follow the directions on the prescription label. You can take it with or without food. Take your medicine at regular intervals. Do not take it more often than directed. Do  not stop taking except on your doctor's advice. Talk to your pediatrician regarding the use of this medicine in children. Special care may be needed. Overdosage: If you think you've taken too much of this medicine contact a poison control center or emergency room at once. Overdosage: If you think you have taken too much of this medicine contact a poison control center or emergency room at once. NOTE: This medicine is only for you. Do not share this medicine with others. What if I miss a dose? If you miss a dose, take it as soon as you can. If it is almost time for your next dose, take only that dose. Do not take double or extra doses. What may interact with this medicine? -cabergoline -certain medicines for depression, anxiety, or psychotic disturbances -certain medicines for erectile dysfunction -certain medicines for migraine headache like almotriptan, eletriptan, frovatriptan, naratriptan, rizatriptan, sumatriptan, zolmitriptan -dextromethorphan -linezolid -MAOIs like Carbex, Eldepryl, Marplan, Nardil, and Parnate -medicines for diabetes -orlistat -tramadol -St. John's Wort -stimulant medicines for attention disorders, weight loss, or to stay awake This list may not describe all possible interactions. Give your health care provider a list of all the medicines, herbs, non-prescription drugs, or dietary supplements you use. Also tell them if you smoke, drink alcohol, or use illegal drugs. Some items may interact with  your medicine. What should I watch for while using this medicine? This medicine is intended to be used in addition to a healthy diet and appropriate exercise. The best results are achieved this way. Do not increase or in any way change your dose without consulting your doctor or health care professional. Your doctor should tell you to stop taking this medicine if you do not lose a certain amount of weight within the first 12 weeks of treatment. Visit your doctor or health care  professional for regular checkups. Your doctor may order blood tests or other tests to see how you are doing. Do not drive, use machinery, or do anything that needs mental alertness until you know how this medicine affects you. This medicine may affect blood sugar levels. If you have diabetes, check with your doctor or health care professional before you change your diet or the dose of your diabetic medicine. Patients and their families should watch out for worsening depression or thoughts of suicide. Also watch out for sudden changes in feelings such as feeling anxious, agitated, panicky, irritable, hostile, aggressive, impulsive, severely restless, overly excited and hyperactive, or not being able to sleep. If this happens, especially at the beginning of treatment or after a change in dose, call your health care professional. Contact you doctor or health care professional right away if the erection lasts longer than 4 hours or if it becomes painful. This may be a sign of serious problem and must be treated right away to prevent permanent damage. What side effects may I notice from receiving this medicine? Side effects that you should report to your doctor or health care professional as soon as possible: -allergic reactions like skin rash, itching or hives, swelling of the face, lips, or tongue -abnormal production of milk -breast enlargement in both males and females -breathing problems -changes in emotions or moods -changes in vision -confusion -erection lasting more than 4 hours -fast or irregular heart beat -feeling faint or lightheaded, falls -fever or chills, sore throat -hallucination, loss of contact with reality -high or low blood pressure -menstrual changes -restlessness -seizures -slow or irregular heartbeat -stiff muscles -sweating -suicidal thoughts or other mood changes -tremors -trouble walking -unusually weak or tired -vomiting Side effects that usually do not require  medical attention (Report these to your doctor or health care professional if they continue or are bothersome.): -back pain -constipation -cough -dizziness -dry mouth -low blood sugar if you have diabetes (ask your doctor or healthcare professional for a list of these symptoms) -nausea -tiredness This list may not describe all possible side effects. Call your doctor for medical advice about side effects. You may report side effects to FDA at 1-800-FDA-1088. Where should I keep my medicine? Keep out of the reach of children. Store at room temperature between 15 and 30 degrees C (59 and 86 degrees F). Throw away any unused medicine after the expiration date. NOTE: This sheet is a summary. It may not cover all possible information. If you have questions about this medicine, talk to your doctor, pharmacist, or health care provider.  2015, Elsevier/Gold Standard. (2010-07-06 17:15:17)

## 2014-01-27 NOTE — Progress Notes (Signed)
Chief Complaint:  Chief Complaint  Patient presents with  . Cough    Non-productive x1 week- no relief with OTC meds  . Headache  . Nasal Congestion  . Generalized Body Aches    HPI: Brooke [name] is a 49 y.o. female who is here for  1 week histry of cough, headache with cough and tiredenss , and nasal congestion, she is tired and also has tried everything to feel better, She has subjective warmth and chills She has tried otc meds without relief.  Additionally she is always stired and ahs weight loss, she has gotten a trainer, is eatingbetter, she ahs been doing this for 1 month but she is not understanding why she has had weight gain even when is cutting down her food intake and exercising, seh wants to know if she can be on a weight loss stimulant.  She is always tired. Does not how much sleep she gets.  She has a hx of depression, gastric bypass  Wt Readings from Last 3 Encounters:  01/27/14 258 lb (117.028 kg)  05/03/13 241 lb (109.317 kg)  06/04/12 223 lb (101.152 kg)     Past Medical History  Diagnosis Date  . Anemia   . Depression, postpartum   . Yeast infection   . Fatigue   . Chronic headaches   . Anxiety    Past Surgical History  Procedure Laterality Date  . Gastric bypass    . Tubal ligation    . Cholecystectomy     History   Social History  . Marital Status: Married    Spouse Name: N/A    Number of Children: N/A  . Years of Education: N/A   Social History Main Topics  . Smoking status: Never Smoker   . Smokeless tobacco: Never Used  . Alcohol Use: 2.0 - 2.5 oz/week    4-5 drink(s) per week  . Drug Use: No  . Sexual Activity: Yes    Birth Control/ Protection: Surgical   Other Topics Concern  . None   Social History Narrative   Family History  Problem Relation Age of Onset  . Diabetes Mother   . Hypertension Mother   . Spinal muscular atrophy Mother   . Irregular heart beat Mother   . Anxiety disorder Mother   . Arthritis Mother     . Cancer Other     pancreatic   No Known Allergies Prior to Admission medications   Medication Sig Start Date End Date Taking? Authorizing Provider  diclofenac (VOLTAREN) 75 MG EC tablet Take 1 tablet (75 mg total) by mouth 2 (two) times daily. Patient not taking: Reported on 01/27/2014 05/03/13   Mancel Bale, PA-C  meloxicam (MOBIC) 7.5 MG tablet Take one or two daily for pain and inflammation Patient not taking: Reported on 01/27/2014 06/04/12   Darreld Mclean, MD  traMADol (ULTRAM) 50 MG tablet Take 1 tablet (50 mg total) by mouth every 8 (eight) hours as needed for pain. Patient not taking: Reported on 01/27/2014 06/04/12   Darreld Mclean, MD     ROS: The patient denies unintentional weight loss, chest pain, palpitations, wheezing, dyspnea on exertion, nausea, vomiting, abdominal pain, dysuria, hematuria, melena, numbness,  or tingling.   All other systems have been reviewed and were otherwise negative with the exception of those mentioned in the HPI and as above.    PHYSICAL EXAM: Filed Vitals:   01/27/14 1030  BP: 122/78  Pulse: 77  Temp:  97.7 F (36.5 C)  Resp: 18   Filed Vitals:   01/27/14 1030  Height: 5\' 11"  (1.803 m)  Weight: 258 lb (117.028 kg)   Body mass index is 36 kg/(m^2).  General: Alert, no acute distress HEENT:  Normocephalic, atraumatic, oropharynx patent. EOMI, PERRLA + right thyroid nodule vs LAD Erythematous throat, no exudates, TM normal, + sinus tenderness, + erythematous/boggy nasal mucosa Cardiovascular:  Regular rate and rhythm, no rubs murmurs or gallops.  No Carotid bruits, radial pulse intact. No pedal edema.  Respiratory: Clear to auscultation bilaterally.  No wheezes, rales, or rhonchi.  No cyanosis, no use of accessory musculature GI: No organomegaly, abdomen is soft and non-tender, positive bowel sounds.  No masses. Skin: No rashes. Neurologic: Facial musculature symmetric. Psychiatric: Patient is appropriate throughout our  interaction. Lymphatic: No cervical lymphadenopathy Musculoskeletal: Gait intact.   LABS: Results for orders placed or performed in visit on 01/27/14  POCT urine pregnancy  Result Value Ref Range   Preg Test, Ur Negative      EKG/XRAY:   Primary read interpreted by Dr. Marin Comment at [facility].   ASSESSMENT/PLAN: Encounter Diagnoses  Name Primary?  . Acute maxillary sinusitis, recurrence not specified Yes  . Otalgia, bilateral   . Cough   . Weight gain   . Thyroid nodule   . Amenorrhea    Rx Augmentin, tussionex, nasacort, diflucan for yeast prn, delsyn F/u in 2 weeks for thyroid nodule. IF her URI sxs resolve and nodule is still present I would consider a scan to rule out thyroid goiter ro something different. Willget TSh at that time, i prefer her not to be acutely sick before getting TSh and also to see if nodule resolves Gave info about Blevique but she really needs to jsut watch what she eats and also exercise, has a h/o gsatric bypass   Gross sideeffects, risk and benefits, and alternatives of medications d/w patient. Patient is aware that all medications have potential sideeffects and we are unable to predict every sideeffect or drug-drug interaction that may occur.  Kyley Laurel, [place], DO 01/27/2014 11:09 AM

## 2014-01-29 ENCOUNTER — Telehealth: Payer: Self-pay

## 2014-01-29 NOTE — Telephone Encounter (Signed)
Yes , she can be out the rest of the week.

## 2014-01-29 NOTE — Telephone Encounter (Signed)
Work note is ready to pick up. Pt called and she was notified.

## 2014-01-29 NOTE — Telephone Encounter (Signed)
Pt was seen by Dr.Le and is still feeling bad, would like to have an oow note for today thru Friday. Please call pt at [phone], will need to know by today so she can call her job

## 2014-01-29 NOTE — Telephone Encounter (Signed)
Is this ok Dr. Marin Comment?

## 2014-02-17 ENCOUNTER — Encounter: Payer: Self-pay | Admitting: Family Medicine

## 2014-03-14 ENCOUNTER — Encounter: Payer: Self-pay | Admitting: Family Medicine

## 2014-03-14 ENCOUNTER — Ambulatory Visit (INDEPENDENT_AMBULATORY_CARE_PROVIDER_SITE_OTHER): Payer: BC Managed Care – PPO | Admitting: Family Medicine

## 2014-03-14 ENCOUNTER — Other Ambulatory Visit: Payer: Self-pay | Admitting: Family Medicine

## 2014-03-14 VITALS — BP 136/84 | HR 69 | Temp 98.0°F | Resp 16 | Ht 71.0 in | Wt 254.0 lb

## 2014-03-14 DIAGNOSIS — G4700 Insomnia, unspecified: Secondary | ICD-10-CM | POA: Diagnosis not present

## 2014-03-14 DIAGNOSIS — N951 Menopausal and female climacteric states: Secondary | ICD-10-CM

## 2014-03-14 DIAGNOSIS — Z566 Other physical and mental strain related to work: Secondary | ICD-10-CM

## 2014-03-14 DIAGNOSIS — E041 Nontoxic single thyroid nodule: Secondary | ICD-10-CM | POA: Diagnosis not present

## 2014-03-14 DIAGNOSIS — R635 Abnormal weight gain: Secondary | ICD-10-CM | POA: Diagnosis not present

## 2014-03-14 LAB — COMPLETE METABOLIC PANEL WITHOUT GFR
ALT: 19 U/L (ref 0–35)
AST: 22 U/L (ref 0–37)
Albumin: 3.9 g/dL (ref 3.5–5.2)
Alkaline Phosphatase: 51 U/L (ref 39–117)
GFR, Est Non African American: >89 mL/min
Potassium: 4.1 meq/L (ref 3.5–5.3)
Sodium: 134 meq/L — ABNORMAL LOW (ref 135–145)
Total Protein: 6.8 g/dL (ref 6.0–8.3)

## 2014-03-14 LAB — CBC
HCT: 37.5 % (ref 36.0–46.0)
Hemoglobin: 12.1 g/dL (ref 12.0–15.0)
MCH: 31.5 pg (ref 26.0–34.0)
MCHC: 32.3 g/dL (ref 30.0–36.0)
MCV: 97.7 fL (ref 78.0–100.0)
MPV: 9.6 fL (ref 8.6–12.4)
Platelets: 341 10*3/uL (ref 150–400)
RBC: 3.84 MIL/uL — ABNORMAL LOW (ref 3.87–5.11)
RDW: 13.5 % (ref 11.5–15.5)
WBC: 5.4 10*3/uL (ref 4.0–10.5)

## 2014-03-14 LAB — COMPLETE METABOLIC PANEL WITH GFR
BUN: 9 mg/dL (ref 6–23)
CO2: 24 mEq/L (ref 19–32)
Calcium: 8.7 mg/dL (ref 8.4–10.5)
Chloride: 101 mEq/L (ref 96–112)
Creat: 0.66 mg/dL (ref 0.50–1.10)
GFR, Est African American: >89 mL/min
Glucose, Bld: 81 mg/dL (ref 70–99)
Total Bilirubin: 0.4 mg/dL (ref 0.2–1.2)

## 2014-03-14 LAB — T4, FREE: Free T4: 0.92 ng/dL (ref 0.80–1.80)

## 2014-03-14 LAB — TSH: TSH: 0.6 u[IU]/mL (ref 0.350–4.500)

## 2014-03-14 LAB — T3, FREE: T3, Free: 2.5 pg/mL (ref 2.3–4.2)

## 2014-03-14 MED ORDER — SERTRALINE HCL 25 MG PO TABS: 25.0000 mg | ORAL_TABLET | Freq: Every day | ORAL | Status: DC

## 2014-03-14 MED ORDER — CLONAZEPAM 0.5 MG PO TABS: 0.5000 mg | ORAL_TABLET | Freq: Two times a day (BID) | ORAL | Status: DC | PRN

## 2014-03-14 NOTE — Progress Notes (Signed)
Chief Complaint:  Chief Complaint  Patient presents with  . Follow-up  . Thyroid Problem    HPI: Brooke [name] is a 49 y.o. female who is here for thyroid nodule follow up  After her acute upper respiratory illness. She denies any prior history of thyroid disease.  He is a Pharmacist, hospital , teaching fifth grade currently. She feels that she has worsening anxiety and depression. She does not want to go to work. She dreads going to work. She has been teaching for 11 years and she's never felt this  a. This class has  been very tough for her.  She has been more emotional. He is not sleeping well. She wants to handle her anxiety. He denies any suicidal, homicidal   Thoughts or hallucinations.  She had postpartum depression.  she also is concerned that her weight gain is not well controlled. Family history of obesity and she used to be obese and she does not want to be obese.   She has had gastric bypass.She wants me togive her some weight loss medications.  She has been working out in watching what she eats, cutting carbohydrates so she is not understanding why she is not losing more weight. She has done this before without any problems. She has been without a menstrual cycle consistently this past year. She can go 2-3 months without a cycle. He also complains of  Easy bruising.   BP Readings from Last 3 Encounters:  03/14/14 136/84  01/27/14 122/78  05/13/13 120/74   Wt Readings from Last 3 Encounters:  03/14/14 254 lb (115.214 kg)  01/27/14 258 lb (117.028 kg)  05/03/13 241 lb (109.317 kg)      Past Medical History  Diagnosis Date  . Anemia   . Depression, postpartum   . Yeast infection   . Fatigue   . Chronic headaches   . Anxiety    Past Surgical History  Procedure Laterality Date  . Gastric bypass    . Tubal ligation    . Cholecystectomy     History   Social History  . Marital Status: Married    Spouse Name: N/A  . Number of Children: N/A  . Years of Education: N/A    Social History Main Topics  . Smoking status: Never Smoker   . Smokeless tobacco: Never Used  . Alcohol Use: 2.0 - 2.5 oz/week    4-5 drink(s) per week  . Drug Use: No  . Sexual Activity: Yes    Birth Control/ Protection: Surgical   Other Topics Concern  . None   Social History Narrative   Family History  Problem Relation Age of Onset  . Diabetes Mother   . Hypertension Mother   . Spinal muscular atrophy Mother   . Irregular heart beat Mother   . Anxiety disorder Mother   . Arthritis Mother   . Cancer Other     pancreatic   No Known Allergies Prior to Admission medications   Not on File     ROS: The patient denies fevers, chills, night sweats, unintentional weight loss, chest pain, palpitations, wheezing, dyspnea on exertion, nausea, vomiting, abdominal pain, dysuria, hematuria, melena, numbness, weakness, or tingling.   All other systems have been reviewed and were otherwise negative with the exception of those mentioned in the HPI and as above.    PHYSICAL EXAM: Filed Vitals:   03/14/14 1236  BP: 136/84  Pulse: 69  Temp: 98 F (36.7 C)  Resp: 16  Filed Vitals:   03/14/14 1236  Height: 5\' 11"  (1.803 m)  Weight: 254 lb (115.214 kg)   Body mass index is 35.44 kg/(m^2).  General: Alert,  Obese African-American female, anxious HEENT:  Normocephalic, atraumatic, oropharynx patent. EOMI, PERRLA,  Left thyroid nodule Cardiovascular:  Regular rate and rhythm, no rubs murmurs or gallops.  No Carotid bruits, radial pulse intact. No pedal edema.  Respiratory: Clear to auscultation bilaterally.  No wheezes, rales, or rhonchi.  No cyanosis, no use of accessory musculature GI: No organomegaly, abdomen is soft and non-tender, positive bowel sounds.  No masses. Skin: No rashes.  Positive one area of ecchymosis, 0.5 cm x 0.5 cm in diameter on her right forearm. No oral mucosal bleeding. No ecchymosis in the mucosal area. Neurologic: Facial musculature  symmetric. Psychiatric: Patient is appropriate throughout our interaction. Lymphatic: No cervical lymphadenopathy Musculoskeletal: Gait intact.   LABS: Results for orders placed or performed in visit on 01/27/14  POCT urine pregnancy  Result Value Ref Range   Preg Test, Ur Negative      EKG/XRAY:   Primary read interpreted by Dr. Marin [name] at [facility].   ASSESSMENT/PLAN: Encounter Diagnoses  Name Primary?  . Stress at work Yes  . Perimenopausal   . Weight gain   . Insomnia   . Thyroid nodule     Mrs. Brooke [name] is a pleasant 49 year old African-American female here for worsening symptoms of anxiety/depression due to work stress. Is anxious about her weight gain and inability to lose weight.  She has a history of gastric bypass and also postpartum depression Will try a trial of  Off 25 mg by mouth daily, clonazepam by mouth twice a day when necessary. Follow-up in 2 months. Labs pending. I would consider during her Topamax since she has a history of headaches. This would help with her weight loss. This is only if all her labs are normal. If she has borderline hyperglycemia  Consistent with diabetes or is prediabetic consider metformin as a weight loss and shift diabetic control agent as well. Either one or the other but not both.  thyroid test are abnormal normal consider ultrasound of thyroid due to nodule on exam.  follow-up in 2 months.  Gross sideeffects, risk and benefits, and alternatives of medications d/w patient. Patient is aware that all medications have potential sideeffects and we are unable to predict every sideeffect or drug-drug interaction that may occur.  LE, Brooke Shoals, Brooke [name] 03/14/2014 4:25 PM

## 2014-03-14 NOTE — Patient Instructions (Signed)
Clonazepam tablets What is this medicine? CLONAZEPAM (kloe NA ze pam) is a benzodiazepine. It is used to treat certain types of seizures. It is also used to treat panic disorder. This medicine may be used for other purposes; ask your health care provider or pharmacist if you have questions. COMMON BRAND NAME(S): Ceberclon, Klonopin What should I tell my health care provider before I take this medicine? They need to know if you have any of these conditions: -an alcohol or drug abuse problem -bipolar disorder, depression, psychosis or other mental health condition -glaucoma -kidney or liver disease -lung or breathing disease -myasthenia gravis -Parkinson's disease -seizures or a history of seizures -suicidal thoughts -an unusual or allergic reaction to clonazepam, other benzodiazepines, foods, dyes, or preservatives -pregnant or trying to get pregnant -breast-feeding How should I use this medicine? Take this medicine by mouth with a glass of water. Follow the directions on the prescription label. If it upsets your stomach, take it with food or milk. Take your medicine at regular intervals. Do not take it more often than directed. Do not stop taking or change the dose except on the advice of your doctor or health care professional. A special MedGuide will be given to you by the pharmacist with each prescription and refill. Be sure to read this information carefully each time. Talk to your pediatrician regarding the use of this medicine in children. Special care may be needed. Overdosage: If you think you have taken too much of this medicine contact a poison control center or emergency room at once. NOTE: This medicine is only for you. Do not share this medicine with others. What if I miss a dose? If you miss a dose, take it as soon as you can. If it is almost time for your next dose, take only that dose. Do not take double or extra doses. What may interact with this medicine? -herbal or  dietary supplements -medicines for depression, anxiety, or psychotic disturbances -medicines for fungal infections like fluconazole, itraconazole, ketoconazole, voriconazole -medicines for HIV infection or AIDS -medicines for sleep -prescription pain medicines -propantheline -rifampin -sevelamer -some medicines for seizures like carbamazepine, phenobarbital, phenytoin, primidone This list may not describe all possible interactions. Give your health care provider a list of all the medicines, herbs, non-prescription drugs, or dietary supplements you use. Also tell them if you smoke, drink alcohol, or use illegal drugs. Some items may interact with your medicine. What should I watch for while using this medicine? Visit your doctor or health care professional for regular checks on your progress. Your body may become dependent on this medicine. If you have been taking this medicine regularly for some time, do not suddenly stop taking it. You must gradually reduce the dose or you may get severe side effects. Ask your doctor or health care professional for advice before increasing or decreasing the dose. Even after you stop taking this medicine it can still affect your body for several days. If you suffer from several types of seizures, this medicine may increase the chance of grand mal seizures (epilepsy). Let your doctor or health care professional know, he or she may want to prescribe an additional medicine. You may get drowsy or dizzy. Do not drive, use machinery, or do anything that needs mental alertness until you know how this medicine affects you. To reduce the risk of dizzy and fainting spells, do not stand or sit up quickly, especially if you are an older patient. Alcohol may increase dizziness and drowsiness. Avoid alcoholic   drinks. Do not treat yourself for coughs, colds or allergies without asking your doctor or health care professional for advice. Some ingredients can increase possible side  effects. The use of this medicine may increase the chance of suicidal thoughts or actions. Pay special attention to how you are responding while on this medicine. Any worsening of mood, or thoughts of suicide or dying should be reported to your health care professional right away. Women who become pregnant while using this medicine may enroll in the [place] Pregnancy Registry by calling [phone]. This registry collects information about the safety of antiepileptic drug use during pregnancy. What side effects may I notice from receiving this medicine? Side effects that you should report to your doctor or health care professional as soon as possible: -allergic reactions like skin rash, itching or hives, swelling of the face, lips, or tongue -changes in vision -confusion -depression -hallucinations -mood changes, excitability or aggressive behavior -movement difficulty, staggering or jerky movements -muscle cramps, weakness -tremors -unusual eye movements Side effects that usually do not require medical attention (report to your doctor or health care professional if they continue or are bothersome): -constipation or diarrhea -difficulty sleeping, nightmares -dizziness, drowsiness -headache -increased saliva from your mouth -nausea, vomiting This list may not describe all possible side effects. Call your doctor for medical advice about side effects. You may report side effects to FDA at 1-800-FDA-1088. Where should I keep my medicine? Keep out of the reach of children. This medicine can be abused. Keep your medicine in a safe place to protect it from theft. Do not share this medicine with anyone. Selling or giving away this medicine is dangerous and against the law. Store at room temperature between 15 and 30 degrees C (59 and 86 degrees F). Protect from light. Keep container tightly closed. Throw away any unused medicine after the expiration date. NOTE: This  sheet is a summary. It may not cover all possible information. If you have questions about this medicine, talk to your doctor, pharmacist, or health care provider.  2015, Elsevier/Gold Standard. (2008-11-14 19:16:36) Sertraline oral solution What is this medicine? SERTRALINE (SER tra leen) is used to treat depression. It may also be used to treat obsessive compulsive disorder, panic disorder, post-trauma stress, premenstrual dysphoric disorder (PMDD) or social anxiety. This medicine may be used for other purposes; ask your health care provider or pharmacist if you have questions. COMMON BRAND NAME(S): Zoloft What should I tell my health care provider before I take this medicine? They need to know if you have any of these conditions: -bipolar disorder or a family history of bipolar disorder -diabetes -glaucoma -heart disease -high blood pressure -history of irregular heartbeat -history of low levels of calcium, magnesium, or potassium in the blood -if you often drink alcohol -liver disease -receiving electroconvulsive therapy -seizures -suicidal thoughts, plans, or attempt; a previous suicide attempt by you or a family member -thyroid disease -an unusual or allergic reaction to sertraline, other medicines, foods, dyes, or preservatives -pregnant or trying to get pregnant -breast-feeding How should I use this medicine? Take this medicine by mouth. Follow the directions on the prescription label. Before taking your dose, you need to dilute the solution in a beverage. Measure your medicine dose using the dropper in the bottle. Next, place the measured dose in at least 4 ounces (one-half cup) of water, ginger-ale, lemon-lime soda, lemonade or orange juice and mix. Do not mix in grapefruit juice or in any other liquids other than those listed.  Drink all of mixed liquid immediately. Do not mix the dose and store it for later. It must be taken right away. You may take this medicine with or  without food. Take your medicine at regular intervals. Do not take your medicine more often than directed. Do not stop taking this medicine suddenly except upon the advice of your doctor. Stopping this medicine too quickly may cause serious side effects or your condition may worsen. A special MedGuide will be given to you by the pharmacist with each prescription and refill. Be sure to read this information carefully each time. Talk to your pediatrician regarding the use of this medicine in children. While this drug may be prescribed for children as young as 7 years for selected conditions, precautions do apply. Overdosage: If you think you have taken too much of this medicine contact a poison control center or emergency room at once. NOTE: This medicine is only for you. Do not share this medicine with others. What if I miss a dose? If you miss a dose, take it as soon as you can. If it is almost time for your next dose, take only that dose. Do not take double or extra doses. What may interact with this medicine? Do not take this medicine with any of the following medications: -certain medicines for fungal infections like fluconazole, itraconazole, ketoconazole, posaconazole, voriconazole -cisapride -disulfiram -dofetilide -linezolid -MAOIs like Carbex, Eldepryl, Marplan, Nardil, and Parnate -metronidazole -methylene blue (injected into a vein) -pimozide -thioridazine -ziprasidone This medicine may also interact with the following medications: -alcohol -aspirin and aspirin-like medicines -certain medicines for depression, anxiety, or psychotic disturbances -certain medicines for irregular heart beat like flecainide, propafenone -certain medicines for migraine headaches like almotriptan, eletriptan, frovatriptan, naratriptan, rizatriptan, sumatriptan, zolmitriptan -certain medicines for sleep -certain medicines for seizures like carbamazepine, valproic acid, phenytoin -certain medicines that  treat or prevent blood clots like warfarin, enoxaparin, dalteparin -cimetidine -digoxin -diuretics -fentanyl -furazolidone -isoniazid -lithium -NSAIDs, medicines for pain and inflammation, like ibuprofen or naproxen -other medicines that prolong the QT interval (cause an abnormal heart rhythm) -procarbazine -rasagiline -supplements like St. John's wort, kava kava, valerian -tolbutamide -tramadol -tryptophan This list may not describe all possible interactions. Give your health care provider a list of all the medicines, herbs, non-prescription drugs, or dietary supplements you use. Also tell them if you smoke, drink alcohol, or use illegal drugs. Some items may interact with your medicine. What should I watch for while using this medicine? Tell your doctor if your symptoms do not get better or if they get worse. Visit your doctor or health care professional for regular checks on your progress. Because it may take several weeks to see the full effects of this medicine, it is important to continue your treatment as prescribed by your doctor. Patients and their families should watch out for new or worsening thoughts of suicide or depression. Also watch out for sudden changes in feelings such as feeling anxious, agitated, panicky, irritable, hostile, aggressive, impulsive, severely restless, overly excited and hyperactive, or not being able to sleep. If this happens, especially at the beginning of treatment or after a change in dose, call your health care professional. Dennis Bast may get drowsy or dizzy. Do not drive, use machinery, or do anything that needs mental alertness until you know how this medicine affects you. Do not stand or sit up quickly, especially if you are an older patient. This reduces the risk of dizzy or fainting spells. Alcohol may interfere with the effect of this  medicine. Avoid alcoholic drinks. This medicine contains a high percentage of alcohol that may interact with medicines used  to treat alcohol abuse, like Antabuse (disulfiram). You should not take these medicines together. Your mouth may get dry. Chewing sugarless gum or sucking hard candy, and drinking plenty of water may help. Contact your doctor if the problem does not go away or is severe. What side effects may I notice from receiving this medicine? Side effects that you should report to your doctor or health care professional as soon as possible: -allergic reactions like skin rash, itching or hives, swelling of the face, lips, or tongue -black or bloody stools, blood in the urine or vomit -fast, irregular heartbeat -feeling faint or lightheaded, falls -hallucination, loss of contact with reality -seizures -suicidal thoughts or other mood changes -unusual bleeding or bruising -unusually weak or tired -vomiting Side effects that usually do not require medical attention (report to your doctor or health care professional if they continue or are bothersome): -change in appetite -change in sex drive or performance -diarrhea -indigestion, nausea -increased sweating -tremors This list may not describe all possible side effects. Call your doctor for medical advice about side effects. You may report side effects to FDA at 1-800-FDA-1088. Where should I keep my medicine? Keep out of the reach of children. Store at room temperature between 15 and 30 degrees C (59 and 86 degrees F). Throw away any unused medicine after the expiration date. NOTE: This sheet is a summary. It may not cover all possible information. If you have questions about this medicine, talk to your doctor, pharmacist, or health care provider.  2015, Elsevier/Gold Standard. (2012-07-17 12:54:37)

## 2014-03-15 ENCOUNTER — Other Ambulatory Visit: Payer: Self-pay | Admitting: Family Medicine

## 2014-03-15 ENCOUNTER — Telehealth: Payer: Self-pay | Admitting: Radiology

## 2014-03-15 DIAGNOSIS — Z113 Encounter for screening for infections with a predominantly sexual mode of transmission: Secondary | ICD-10-CM

## 2014-03-15 NOTE — Telephone Encounter (Signed)
Pt called stating she wants stD tests run on her, she was here 3.11.16. i told her we can only run hiv, rpr, etc since we didn't collect a uriprobe on her. Pt is fine with that. Can you order these things on her? Thanks!

## 2014-03-15 NOTE — Telephone Encounter (Signed)
The labs were ordered. Can you call [facility] to add it. Thanks

## 2014-03-18 LAB — HIV ANTIBODY (ROUTINE TESTING W REFLEX): HIV 1&2 Ab, 4th Generation: NONREACTIVE

## 2014-03-18 LAB — RPR

## 2014-03-18 NOTE — Telephone Encounter (Signed)
Labs added.

## 2014-03-19 LAB — HSV(HERPES SIMPLEX VRS) I + II AB-IGG
HSV 1 Glycoprotein G Ab, IgG: 7.74 IV — ABNORMAL HIGH
HSV 2 Glycoprotein G Ab, IgG: 9.18 IV — ABNORMAL HIGH

## 2014-03-20 ENCOUNTER — Encounter: Payer: Self-pay | Admitting: Family Medicine

## 2014-05-01 ENCOUNTER — Telehealth: Payer: Self-pay | Admitting: Family Medicine

## 2014-05-01 NOTE — Telephone Encounter (Signed)
lmom to call back and reschedule Dr Marin Comment will not be in the clinic on 05/09/14

## 2014-05-09 ENCOUNTER — Ambulatory Visit: Payer: BC Managed Care – PPO | Admitting: Family Medicine

## 2014-06-16 ENCOUNTER — Other Ambulatory Visit: Payer: Self-pay | Admitting: Family Medicine

## 2014-06-23 DIAGNOSIS — R61 Generalized hyperhidrosis: Secondary | ICD-10-CM | POA: Insufficient documentation

## 2014-08-22 ENCOUNTER — Other Ambulatory Visit: Payer: Self-pay | Admitting: Family Medicine

## 2015-01-04 HISTORY — PX: MASTECTOMY: SHX3

## 2015-01-04 HISTORY — PX: REDUCTION MAMMAPLASTY: SUR839

## 2015-01-04 HISTORY — PX: BREAST LUMPECTOMY: SHX2

## 2015-01-30 ENCOUNTER — Other Ambulatory Visit: Payer: Self-pay | Admitting: Obstetrics and Gynecology

## 2015-01-30 DIAGNOSIS — R928 Other abnormal and inconclusive findings on diagnostic imaging of breast: Secondary | ICD-10-CM

## 2015-02-03 ENCOUNTER — Other Ambulatory Visit: Payer: Self-pay

## 2015-02-12 ENCOUNTER — Other Ambulatory Visit: Payer: Self-pay | Admitting: Obstetrics and Gynecology

## 2015-02-12 ENCOUNTER — Ambulatory Visit
Admission: RE | Admit: 2015-02-12 | Discharge: 2015-02-12 | Disposition: A | Discharge status: Home / Self Care | Payer: BC Managed Care – PPO | Source: Ambulatory Visit | Attending: Obstetrics and Gynecology | Admitting: Obstetrics and Gynecology

## 2015-02-12 DIAGNOSIS — R928 Other abnormal and inconclusive findings on diagnostic imaging of breast: Secondary | ICD-10-CM

## 2015-02-16 ENCOUNTER — Ambulatory Visit
Admission: RE | Admit: 2015-02-16 | Discharge: 2015-02-16 | Disposition: A | Discharge status: Home / Self Care | Payer: BC Managed Care – PPO | Source: Ambulatory Visit | Attending: Obstetrics and Gynecology | Admitting: Obstetrics and Gynecology

## 2015-02-16 ENCOUNTER — Other Ambulatory Visit: Payer: Self-pay | Admitting: Obstetrics and Gynecology

## 2015-02-16 DIAGNOSIS — R928 Other abnormal and inconclusive findings on diagnostic imaging of breast: Secondary | ICD-10-CM

## 2015-02-17 ENCOUNTER — Other Ambulatory Visit: Payer: Self-pay | Admitting: Obstetrics and Gynecology

## 2015-02-17 ENCOUNTER — Ambulatory Visit
Admission: RE | Admit: 2015-02-17 | Discharge: 2015-02-17 | Disposition: A | Discharge status: Home / Self Care | Payer: BC Managed Care – PPO | Source: Ambulatory Visit | Attending: Obstetrics and Gynecology | Admitting: Obstetrics and Gynecology

## 2015-02-17 DIAGNOSIS — R928 Other abnormal and inconclusive findings on diagnostic imaging of breast: Secondary | ICD-10-CM

## 2015-02-18 ENCOUNTER — Encounter: Payer: Self-pay | Admitting: *Deleted

## 2015-02-18 ENCOUNTER — Telehealth: Payer: Self-pay | Admitting: *Deleted

## 2015-02-18 DIAGNOSIS — C50211 Malignant neoplasm of upper-inner quadrant of right female breast: Secondary | ICD-10-CM

## 2015-02-18 DIAGNOSIS — Z170 Estrogen receptor positive status [ER+]: Secondary | ICD-10-CM

## 2015-02-18 HISTORY — DX: Malignant neoplasm of upper-inner quadrant of right female breast: C50.211

## 2015-02-18 NOTE — Telephone Encounter (Signed)
Confirmed [facility] for 02/25/15 at 0815 .  Instructions and contact information given.

## 2015-02-25 ENCOUNTER — Other Ambulatory Visit (HOSPITAL_BASED_OUTPATIENT_CLINIC_OR_DEPARTMENT_OTHER): Payer: BC Managed Care – PPO

## 2015-02-25 ENCOUNTER — Ambulatory Visit: Payer: BC Managed Care – PPO | Attending: Surgery | Admitting: Physical Therapy

## 2015-02-25 ENCOUNTER — Ambulatory Visit
Admission: RE | Admit: 2015-02-25 | Discharge: 2015-02-25 | Disposition: A | Discharge status: Home / Self Care | Payer: BC Managed Care – PPO | Source: Ambulatory Visit | Attending: Radiation Oncology | Admitting: Radiation Oncology

## 2015-02-25 ENCOUNTER — Ambulatory Visit (HOSPITAL_BASED_OUTPATIENT_CLINIC_OR_DEPARTMENT_OTHER): Payer: BC Managed Care – PPO | Admitting: Oncology

## 2015-02-25 ENCOUNTER — Encounter: Payer: Self-pay | Admitting: Nurse Practitioner

## 2015-02-25 ENCOUNTER — Encounter: Payer: Self-pay | Admitting: Oncology

## 2015-02-25 ENCOUNTER — Encounter: Payer: Self-pay | Admitting: General Practice

## 2015-02-25 ENCOUNTER — Encounter: Payer: Self-pay | Admitting: Physical Therapy

## 2015-02-25 ENCOUNTER — Encounter: Payer: Self-pay | Admitting: Skilled Nursing Facility1

## 2015-02-25 VITALS — BP 152/95 | HR 65 | Temp 98.0°F | Resp 18 | Ht 71.0 in | Wt 243.3 lb

## 2015-02-25 DIAGNOSIS — C50211 Malignant neoplasm of upper-inner quadrant of right female breast: Secondary | ICD-10-CM | POA: Diagnosis not present

## 2015-02-25 DIAGNOSIS — Z170 Estrogen receptor positive status [ER+]: Secondary | ICD-10-CM | POA: Diagnosis not present

## 2015-02-25 DIAGNOSIS — R293 Abnormal posture: Secondary | ICD-10-CM | POA: Insufficient documentation

## 2015-02-25 LAB — COMPREHENSIVE METABOLIC PANEL
ALT: 25 U/L (ref 0–55)
AST: 18 U/L (ref 5–34)
Albumin: 3.6 g/dL (ref 3.5–5.0)
Alkaline Phosphatase: 64 U/L (ref 40–150)
Anion Gap: 7 mEq/L (ref 3–11)
BUN: 7.3 mg/dL (ref 7.0–26.0)
CHLORIDE: 107 meq/L (ref 98–109)
CO2: 24 meq/L (ref 22–29)
Calcium: 8.8 mg/dL (ref 8.4–10.4)
Creatinine: 0.8 mg/dL (ref 0.6–1.1)
EGFR: >90 mL/min/{1.73_m2} (ref >90)
Glucose: 108 mg/dl (ref 70–140)
POTASSIUM: 4.5 meq/L (ref 3.5–5.1)
SODIUM: 138 meq/L (ref 136–145)
Total Bilirubin: 0.87 mg/dL (ref 0.20–1.20)
Total Protein: 6.7 g/dL (ref 6.4–8.3)

## 2015-02-25 LAB — CBC WITH DIFFERENTIAL/PLATELET
BASO%: 1.1 % (ref 0.0–2.0)
Basophils Absolute: 0.1 10*3/uL (ref 0.0–0.1)
EOS%: 4.8 % (ref 0.0–7.0)
Eosinophils Absolute: 0.2 10*3/uL (ref 0.0–0.5)
HCT: 37.3 % (ref 34.8–46.6)
HGB: 12 g/dL (ref 11.6–15.9)
LYMPH%: 38.4 % (ref 14.0–49.7)
MCH: 31.4 pg (ref 25.1–34.0)
MCHC: 32.2 g/dL (ref 31.5–36.0)
MCV: 97.6 fL (ref 79.5–101.0)
MONO#: 0.3 10*3/uL (ref 0.1–0.9)
MONO%: 6.8 % (ref 0.0–14.0)
NEUT#: 2.2 10*3/uL (ref 1.5–6.5)
NEUT%: 48.9 % (ref 38.4–76.8)
Platelets: 295 10*3/uL (ref 145–400)
RBC: 3.82 10*6/uL (ref 3.70–5.45)
RDW: 13.6 % (ref 11.2–14.5)
WBC: 4.6 10*3/uL (ref 3.9–10.3)
lymph#: 1.8 10*3/uL (ref 0.9–3.3)

## 2015-02-25 MED ORDER — TAMOXIFEN CITRATE 20 MG PO TABS: 20.0000 mg | ORAL_TABLET | Freq: Every day | ORAL | Status: AC

## 2015-02-25 NOTE — Addendum Note (Signed)
Addended by: Rockwell Germany on: 02/25/2015 11:05 AM   Modules accepted: Orders

## 2015-02-25 NOTE — Patient Instructions (Signed)

## 2015-02-25 NOTE — Progress Notes (Signed)
Brooke [name] is a very pleasant 50 y.o. 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  Biopsy results revealed the tumor's prognostic profile is ER positive, PR positive, and HER2/neu negative. Ki67 is 25%.  She presents today with her husband to the [facility]) for treatment consideration and recommendations from the breast surgeon, radiation oncologist, and medical oncologist.     I briefly met with Brooke [name] and her husband during her [facility] visit today. We discussed the purpose of the Survivorship Clinic, which will include monitoring for recurrence, coordinating completion of age and gender-appropriate cancer screenings, promotion of overall wellness, as well as managing potential late/long-term side effects of anti-cancer treatments.    The treatment plan for Brooke [name] will likely include surgery (type pending results of upcoming MRI of the breast), +/- radiation therapy, and anti-estrogen therapy  She will meet with the Genetics Counselor due to her family history of breast cancer. As of today, the intent of treatment for Brooke [name] is cure, therefore she will be eligible for the Survivorship Clinic upon her completion of treatment.  Her survivorship care plan (SCP) document will be drafted and updated throughout the course of her treatment trajectory. She will receive the SCP in an office visit with myself in the Survivorship Clinic once she has completed treatment.   Brooke [name] was encouraged to ask questions and all questions were answered to her satisfaction.  She was given my business card and encouraged to contact me with any concerns regarding survivorship.  I look forward to participating in her care.   Kenn File, [place] [phone]

## 2015-02-25 NOTE — Progress Notes (Signed)
[facility]  [phone] [phone]     ID: Brooke [name] DOB: [birth year]  MR#: [id number]  VQX#:[id number]  Patient Care Team: Eldred Manges, MD as PCP - General (Obstetrics and Gynecology) Chauncey Cruel, MD as Consulting Physician (Oncology) Thea Silversmith, MD as Consulting Physician (Radiation Oncology) Alphonsa Overall, MD as Consulting Physician (General Surgery) PCP: Eldred Manges, MD OTHER MD: [facility]  CHIEF COMPLAINT: Estrogen receptor positive breast cancer  CURRENT TREATMENT: Neoadjuvant tamoxifen   BREAST CANCER HISTORY: Brooke [name] had routine gynecologic follow-up with Dr. Caralee [name] office and as part of that exam she had screening bilateral mammography which showed a possible mass and calcifications in the right breast. She was referred to the [name] where on 02/12/2015 she had a right diagnostic mammography with tomo synthesis and right breast ultrasonography. In the upper inner quadrant of the right breast there was a spiculated mass measuring approximately 1.5 cm. Posterior to this mass, approximately 5.5 cm away, there was a subtle area of pleomorphic calcifications spanning 1 cm. By physical exam the upper inner quadrant right breast mass was palpable 2-3 cm from the nipple. By ultrasound this proved to be irregular and heterogeneous and measuring approximately 2.2 cm. The right axilla was benign.  On 02/16/2015 the patient underwent biopsy of the right breast mass in question and also a more posterior area of calcifications, not seen on the original mammogram, 10 cm away from the biopsied mass. 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 Biopsy of the more posterior area of calcifications  also showed invasive as well as in situ ductal carcinoma, grade 1 or 2, estrogen receptor 95% positive, with strong staining intensity, progesterone receptor 45% positive, with moderate staining intensity, with an MIB-1 of 25%, and HER-2 also negative, with a signals ratio of 1.48 and the number per cell being 2.00.  The patient's subsequent history is as detailed below  INTERVAL HISTORY: Brooke [name] was evaluated in the multidisciplinary breast cancer clinic 02/25/2015 accompanied by her husband Brooke [name]. Her case was also presented at the multidisciplinary breast cancer conference that same morning. At that time a preliminary plan was proposed: Consideration of mastectomy and sentinel lymph node sampling, MRI to help decide whether the patient might need radiation in which case the type of and timing of reconstruction might BE affected him a genetics referral, and an Oncotype. Because of expected delays in definitive surgical decisions neoadjuvant tamoxifen was suggested   REVIEW OF SYSTEMS: There were no specific symptoms leading to the original mammogram, which was routinely scheduled. The patient denies unusual headaches, visual changes, nausea, vomiting, stiff neck, dizziness, or gait imbalance. There has been no cough, phlegm production, or pleurisy, no chest pain or pressure, and no change in bowel or bladder habits. The patient denies fever, rash, bleeding, unexplained fatigue or unexplained weight loss. She exercises at the gym approximately 3 times a week. A detailed review of systems was otherwise entirely negative.  PAST MEDICAL HISTORY: Past Medical History  Diagnosis Date  . Anemia   . Depression, postpartum   . Yeast infection   . Fatigue   . Chronic headaches   . Anxiety   . Breast cancer of upper-inner quadrant of right female breast ([name]) 02/18/2015  . Hot flashes  PAST SURGICAL HISTORY: Past Surgical History  Procedure Laterality Date  . Gastric bypass    . Tubal ligation      . Cholecystectomy      FAMILY HISTORY Family History  Problem Relation Age of Onset  . Diabetes Mother   . Hypertension Mother   . Spinal muscular atrophy Mother   . Irregular heart beat Mother   . Anxiety disorder Mother   . Arthritis Mother   . Cancer Other     pancreatic   the patient has little information on her father's side of the family. Her mother died of complications of nonalcoholic cirrhosis of the age of 93. The patient had 4 brothers, 2 sisters. A maternal great-grandfather reportedly had cancer of some type, but she has no further information.  GYNECOLOGIC HISTORY:  No LMP recorded.  menarche age 34, first live birth age 44, [name]. She has periods fairly irregularly at present and they last about 3 days. She used Depo Provera last in 2000.   SOCIAL HISTORY:   Brooke [name] teaches elementary school. Her husband Brooke [name] is a Customer service manager. Son Brooke (pronounced "X--Brooke") is a Environmental consultant at the [employer] in [place]. [name] is at home with the patient. There are no grandchildren. The patient attends the [employer] in [place]: not in place    HEALTH MAINTENANCE: Social History  Substance Use Topics  . Smoking status: Never Smoker   . Smokeless tobacco: Never Used  . Alcohol Use: 2.0 - 2.5 oz/week    4-5 Standard drinks or equivalent per week     Colonoscopy: never   PAP: February 2017  Bone density: never   Lipid panel:  No Known Allergies  Current Outpatient Prescriptions  Medication Sig Dispense Refill  . clonazePAM (KLONOPIN) 0.5 MG tablet Take 1 tablet (0.5 mg total) by mouth 2 (two) times daily as needed for anxiety (or insomnia). 30 tablet 1  . sertraline (ZOLOFT) 25 MG tablet TAKE 1 TABLET BY MOUTH AT BEDTIME.  'OV NEEDED" 30 tablet 0   No current facility-administered medications for this visit.    OBJECTIVE: middle-aged African-American woman who appears stated age   50 Vitals:   02/25/15 0941  BP: 152/95  Pulse: 65  Temp: 98 F (36.7 C)  Resp: 18     Body mass index is 33.95 kg/(m^2).    ECOG FS:0 - Asymptomatic  Ocular: Sclerae unicteric, pupils equal, round and reactive to light Ear-nose-throat: Oropharynx clear and moist Lymphatic: No cervical or supraclavicular adenopathy Lungs no rales or rhonchi, good excursion bilaterally Heart regular rate and rhythm, no murmur appreciated Abd soft, nontender, positive bowel sounds; scars from cholecystectomy and gastric bypass surgery noted  MSK no focal spinal tenderness, no joint edema Neuro: non-focal, well-orietearfulfect Breasts: In the upper slightly inner quadrant of the right breast there is a palpable movable nontender mass measuring approximately 2 cm by palpation. There are no skin or nipple changes of concern. The right axilla is benign. The left breast is unremarkable    LAB RESULTS:  CMP     Component Value Date/Time   NA 138 02/25/2015 0837   NA 134* 03/14/2014 1354   K 4.5 02/25/2015 0837   K 4.1 03/14/2014 1354   CL 101 03/14/2014 1354   CO2 24 02/25/2015 0837   CO2 24 03/14/2014 1354   GLUCOSE 108 02/25/2015 0837   GLUCOSE 81 03/14/2014 1354   BUN  7.3 02/25/2015 0837   BUN 9 03/14/2014 1354   CREATININE 0.8 02/25/2015 0837   CREATININE 0.66 03/14/2014 1354   CALCIUM 8.8 02/25/2015 0837   CALCIUM 8.7 03/14/2014 1354   PROT 6.7 02/25/2015 0837   PROT 6.8 03/14/2014 1354   ALBUMIN 3.6 02/25/2015 0837   ALBUMIN 3.9 03/14/2014 1354   AST 18 02/25/2015 0837   AST 22 03/14/2014 1354   ALT 25 02/25/2015 0837   ALT 19 03/14/2014 1354   ALKPHOS 64 02/25/2015 0837   ALKPHOS 51 03/14/2014 1354   BILITOT 0.87 02/25/2015 0837   BILITOT 0.4 03/14/2014 1354   GFRNONAA >89 03/14/2014 1354   GFRAA >89 03/14/2014 1354    INo results found for: SPEP, UPEP  Lab Results  Component Value Date   WBC 4.6 02/25/2015   NEUTROABS 2.2 02/25/2015   HGB 12.0 02/25/2015   HCT 37.3  02/25/2015   MCV 97.6 02/25/2015   PLT 295 02/25/2015      Chemistry      Component Value Date/Time   NA 138 02/25/2015 0837   NA 134* 03/14/2014 1354   K 4.5 02/25/2015 0837   K 4.1 03/14/2014 1354   CL 101 03/14/2014 1354   CO2 24 02/25/2015 0837   CO2 24 03/14/2014 1354   BUN 7.3 02/25/2015 0837   BUN 9 03/14/2014 1354   CREATININE 0.8 02/25/2015 0837   CREATININE 0.66 03/14/2014 1354      Component Value Date/Time   CALCIUM 8.8 02/25/2015 0837   CALCIUM 8.7 03/14/2014 1354   ALKPHOS 64 02/25/2015 0837   ALKPHOS 51 03/14/2014 1354   AST 18 02/25/2015 0837   AST 22 03/14/2014 1354   ALT 25 02/25/2015 0837   ALT 19 03/14/2014 1354   BILITOT 0.87 02/25/2015 0837   BILITOT 0.4 03/14/2014 1354       No results found for: LABCA2  No components found for: [id number]  No results for input(s): INR in the last 168 hours.  Urinalysis    Component Value Date/Time   BILIRUBINUR neg 05/03/2012 0954   PROTEINUR neg 05/03/2012 0954   UROBILINOGEN 0.2 05/03/2012 0954   NITRITE positive 05/03/2012 0954   LEUKOCYTESUR Trace 05/03/2012 0954      ELIGIBLE FOR AVAILABLE RESEARCH PROTOCOL: no  STUDIES: Mm Digital Diagnostic Unilat R  02/17/2015  CLINICAL DATA:  Post biopsy mammogram of the right breast for clip placement. EXAM: DIAGNOSTIC RIGHT MAMMOGRAM POST STEREOTACTIC BIOPSY COMPARISON:  Previous exam(s). FINDINGS: Mammographic images were obtained following stereotactic guided biopsy of calcifications in the upper inner right breast. The coil shaped biopsy marking clip is positioned approximately 4 cm posterior and superior to the intended site of biopsy on the MLO view, and 6 cm posterior and medial to the intended site of biopsy on the CC view. This coil shaped clip lies approximately 10 cm posterior to the ribbon shaped clip in the biopsy proven invasive malignancy. IMPRESSION: The coil shaped biopsy marking clip is positioned approximately 6 cm posterior, superior and  medial to the intended site of biopsy. If the biopsy results are benign, then an additional biopsy should be considered of the suspicious calcifications in the upper inner quadrant. Final Assessment: Post Procedure Mammograms for Marker Placement Electronically Signed   By: Ammie Ferrier M.D.   On: 02/17/2015 13:36   Mm Digital Diagnostic Unilat R  02/16/2015  CLINICAL DATA:  Post biopsy mammogram of the right breast for clip placement. EXAM: DIAGNOSTIC RIGHT MAMMOGRAM POST ULTRASOUND BIOPSY COMPARISON:  Previous  exam(s). FINDINGS: Mammographic images were obtained following ultrasound guided biopsy of mass in the right breast at 1230. The ribbon shaped biopsy marking clip is appropriately positioned at the intended site of biopsy within the mass at 1230. The small group of calcifications in the farther posterior depth of the upper inner quadrant are again noted. IMPRESSION: 1. Appropriate positioning of the ribbon shaped biopsy marking clip at the site of biopsy at 1230 in the right breast. 2. The small group of suspicious calcifications are again noted in the upper inner quadrant of the right breast, and are scheduled for stereotactic biopsy on 02/17/2015. Final Assessment: Post Procedure Mammograms for Marker Placement Electronically Signed   By: Ammie Ferrier M.D.   On: 02/16/2015 13:49   US Breast Ltd Uni Right Inc Axilla  02/12/2015  CLINICAL DATA:  Screening recall for a right breast mass. EXAM: DIGITAL DIAGNOSTIC RIGHT MAMMOGRAM WITH 3D TOMOSYNTHESIS AND CAD RIGHT BREAST ULTRASOUND COMPARISON:  Previous exam(s). ACR Breast Density Category b: There are scattered areas of fibroglandular density. FINDINGS: In the upper slightly inner quadrant of the right breast, anterior depth there is a spiculated mass with pleomorphic calcifications measuring approximately 1.5 cm mammographically. Approximately 5.5 cm posterior to this mass, there is a more subtle area of density with pleomorphic calcifications  spanning 1.0 cm. Mammographic images were processed with CAD. Physical exam of the upper slightly inner quadrant of the right breast demonstrates a firm palpable mass approximately 2-3 cm from the nipple. Ultrasound targeted to the 1230 location, 2 cm from the nipple demonstrates a heterogeneous irregular mass with both hyperechoic and hypoechoic components measuring approximately 1.9 x 1.6 x 2.2 cm in the radial and anti radial planes. The right axilla was imaged demonstrating normal-appearing lymph nodes with preserved fatty hila. IMPRESSION: 1. The known spiculated mass in the upper-inner quadrant of the right breast is highly suspicious for malignancy. 2. The density with calcifications in the upper inner quadrant of the right breast, posterior depth is concerning for additional site of disease. RECOMMENDATION: 1. Ultrasound-guided biopsy is recommended for the spiculated mass in the upper inner right breast. The biopsy has been scheduled for 02/16/2015 at 12 noon. 2. Ultrasound is recommended for the density with calcifications in the upper inner right breast in the posterior depth. This is seen sonographically, a second biopsy will be recommended. If not, a stereotactic biopsy should follow. I have discussed the findings and recommendations with the patient. Results were also provided in writing at the conclusion of the visit. If applicable, a reminder letter will be sent to the patient regarding the next appointment. BI-RADS CATEGORY  5: Highly suggestive of malignancy. Electronically Signed   By: Ammie Ferrier M.D.   On: 02/12/2015 17:05   Mm Diag Breast Tomo Uni Right  02/12/2015  CLINICAL DATA:  Screening recall for a right breast mass. EXAM: DIGITAL DIAGNOSTIC RIGHT MAMMOGRAM WITH 3D TOMOSYNTHESIS AND CAD RIGHT BREAST ULTRASOUND COMPARISON:  Previous exam(s). ACR Breast Density Category b: There are scattered areas of fibroglandular density. FINDINGS: In the upper slightly inner quadrant of the  right breast, anterior depth there is a spiculated mass with pleomorphic calcifications measuring approximately 1.5 cm mammographically. Approximately 5.5 cm posterior to this mass, there is a more subtle area of density with pleomorphic calcifications spanning 1.0 cm. Mammographic images were processed with CAD. Physical exam of the upper slightly inner quadrant of the right breast demonstrates a firm palpable mass approximately 2-3 cm from the nipple. Ultrasound targeted to the 1230  location, 2 cm from the nipple demonstrates a heterogeneous irregular mass with both hyperechoic and hypoechoic components measuring approximately 1.9 x 1.6 x 2.2 cm in the radial and anti radial planes. The right axilla was imaged demonstrating normal-appearing lymph nodes with preserved fatty hila. IMPRESSION: 1. The known spiculated mass in the upper-inner quadrant of the right breast is highly suspicious for malignancy. 2. The density with calcifications in the upper inner quadrant of the right breast, posterior depth is concerning for additional site of disease. RECOMMENDATION: 1. Ultrasound-guided biopsy is recommended for the spiculated mass in the upper inner right breast. The biopsy has been scheduled for 02/16/2015 at 12 noon. 2. Ultrasound is recommended for the density with calcifications in the upper inner right breast in the posterior depth. This is seen sonographically, a second biopsy will be recommended. If not, a stereotactic biopsy should follow. I have discussed the findings and recommendations with the patient. Results were also provided in writing at the conclusion of the visit. If applicable, a reminder letter will be sent to the patient regarding the next appointment. BI-RADS CATEGORY  5: Highly suggestive of malignancy. Electronically Signed   By: Ammie Ferrier M.D.   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 This was found to be concordant by Dr. Ammie Ferrier. Pathology results were discussed with the patient by telephone. The patient reported tenderness and is doing well after the biopsy. Post biopsy instructions and care were reviewed and questions were answered. The patient was encouraged to call [facility] for any additional concerns. The patient was referred to [facility] on February 25, 2015. Outstanding recommendation for MRI for further evaluation of disease extent. Pathology results reported by Terie Purser, RN on 02/18/2015. Electronically Signed   By: Ammie Ferrier M.D.   On: 02/18/2015 12:14  02/18/2015  CLINICAL DATA:  50 year old female presenting for stereotactic biopsy of right breast calcifications. EXAM: RIGHT BREAST STEREOTACTIC CORE NEEDLE BIOPSY COMPARISON:  Previous exams. FINDINGS: The patient and I discussed the procedure of stereotactic-guided biopsy including benefits and alternatives. We discussed the high likelihood of a successful procedure. We discussed the risks of the procedure including infection, bleeding, tissue injury, clip migration, and inadequate sampling. Informed written consent was given. The usual time out protocol was performed immediately prior to the procedure. Using sterile technique and 1% Lidocaine as local anesthetic, under stereotactic guidance, a 9 gauge vacuum assisted device was used to perform core needle biopsy of calcifications in the upper inner quadrant of the right breast using a medial approach. Specimen radiograph was performed showing calcifications within at least 3 core samples. Specimens with calcifications are identified for pathology. At the conclusion of the procedure, a coil shaped  tissue marker clip was deployed into the biopsy cavity. Follow-up 2-view mammogram was performed and dictated separately. IMPRESSION: Stereotactic-guided biopsy of calcifications in the upper inner right breast. No apparent complications. Electronically Signed: By: Ammie Ferrier M.D. On: 02/17/2015 13:31   [place] Rt Breast Bx W Loc Dev 1st Lesion Img Bx Spec US Guide  02/17/2015  ADDENDUM REPORT: 02/17/2015 13:58 ADDENDUM: PATHOLOGY: Grade 1/2 invasive ductal carcinoma and ductal carcinoma in situ. CONCORDANT: Yes I discussed  the results with the patient in person on 02/17/2015 at 1 p.m. All questions were answered. The patient denies significant pain or bleeding from the biopsy site. Biopsy site care instructions were reviewed and the patient was asked to call [facility] with any questions or issues related to the biopsy. RECOMMENDATION: 1. Multidisciplinary consultation is recommended, and the patient is scheduled for an [facility] appointment on 02/25/2015 at 8 am. 2. Note that a stereotactic biopsy was performed today 02/17/2015, and follow-up of the pathology results is necessary. The site biopsied today is farther posterior than the site initially intended for targeting, though calcifications were seen in the cores. If the pathology results for the site are benign, and additional stereotactic biopsy for the remaining site of calcifications is should be considered it breast conserving therapy is under consideration. If the site is positive, then MRI should be considered for further evaluation of disease extent. Electronically Signed   By: Ammie Ferrier M.D.   On: 02/17/2015 13:58  02/17/2015  CLINICAL DATA:  50 year old female presenting for ultrasound-guided biopsy of a right breast mass. EXAM: ULTRASOUND GUIDED RIGHT BREAST CORE NEEDLE BIOPSY COMPARISON:  Previous exam(s). FINDINGS: I met with the patient and we discussed the procedure of ultrasound-guided biopsy, including benefits and  alternatives. We discussed the high likelihood of a successful procedure. We discussed the risks of the procedure, including infection, bleeding, tissue injury, clip migration, and inadequate sampling. Informed written consent was given. The usual time-out protocol was performed immediately prior to the procedure. Using sterile technique and 1% Lidocaine as local anesthetic, under direct ultrasound visualization, a 14 gauge spring-loaded device was used to perform biopsy of right breast mass at 1230 using a medial approach. At the conclusion of the procedure a ribbon shaped tissue marker clip was deployed into the biopsy cavity. Follow up 2 view mammogram was performed and dictated separately. Following the biopsy, ultrasound was performed of the upper inner quadrant of the right breast in the region of the calcifications seen on the diagnostic mammogram. There is no mass or suspicious area of shadowing identified. IMPRESSION: 1. Ultrasound guided biopsy of a right breast mass at 1230. No apparent complications. 2. There is no sonographic correlate for the calcifications in the upper inner quadrant of the right breast identified on diagnostic mammography. Therefore, stereotactic biopsy is recommended, and will be performed on 02/16/2014. Electronically Signed: By: Ammie Ferrier M.D. On: 02/16/2015 13:47    ASSESSMENT: 50 y.o. [place] woman s/p biopsy 02/17/2015 of 2 places in her right breast, separated by 10 cm, both showing invasive ductal carcinoma, clinically T2 NX, stage II, estrogen receptor and progesterone receptor strongly positive, HER-2/neu nonamplified, with an MIB-1 of 25%  (1) genetics testing pending  (2) Oncotype DX to be sent from the original biopsy, results pending  (3) right mastectomy with sentinel lymph node sampling plus or minus contralateral mastectomy planned  (4) patient is considering reconstruction plus or minus contralateral breast reduction  (5) adjuvant  radiation to be considered as appropriate  (6) tamoxifen started neoadjuvantly on 02/25/2015  PLAN: We spent the better part of today's hour-plus appointment discussing the biology of breast cancer in general, and the specifics of the patient's tumor in particular. Genine understands there is no survival advantage to mastectomy as compared to lumpectomy plus radiation. In her case however, with 2 biopsy-proven areas of cancer in the right breast separated by 10 cm, and a third area between those which has not yet been biopsied, we  think mastectomy is going to be hard to avoid. We're going to obtain a breast MRI to clarify the decision further but at this point with the information we have we are recommending right mastectomy and sentinel lymph node sampling.  She qualifies for genetics testing. Again, she is not obligated to have bilateral mastectomies if she proves to carry a deleterious mutation. We can do intensified screening instead with yearly mammography and MRI. However many patients do choose bilateral mastectomies in that situation. We will not have that result for approximately 1 month.  Because the final surgical decision will depend on the MRI and genetic studies, which will delay the final procedure, and because she may wish to consider reconstruction, which itself can be a lengthy learning process with many possible choices, we think the safest thing to do in this setting is to start tamoxifen now. This will allow her to take her time making surgical and reconstruction decisions and have all the test results in hand before making a definitive decision.  Accordingly today we discussed the possible toxicities, side effects and complications of tamoxifen and I have gone ahead and place the prescription in her pharmacy.  After completing the discussion of local treatment options, we discussed systemic treatment options. She will be a good candidate for anti-estrogens and not at all a candidate  for anti-HER-2 treatment. The benefits of chemotherapy are harder to evaluate. She is a young woman with what may turn out to be a large tumor, but low-grade and apparently node-negative. The proliferation fraction is borderline. We are going to get an Oncotype to help [place] with the chemotherapy decision. That of course will take a couple of weeks for results.  At this point the overall plan is to start tamoxifen now, proceed to breast MRI and discussion with the surgeon and plastic surgeon regarding mastectomy versus multiple lumpectomies versus bilateral mastectomies versus single mastectomy with contralateral breast reduction. She will also need to explore the kinds of reconstruction she would prefer and whether she would like to have immediate reconstruction as opposed to delayed.  She will return to see me in a few weeks. At that time we should have all the information in hand and we will clarify any remaining questions. She will also be able to tell me how she is tolerating the tamoxifen. We should then be ready to proceed to surgery. The decision regarding radiation of course we'll not be definitively made until we have the final surgical results  Brooke [name] is very distressed about her situation but does understand the goal of treatment in her case is cure. She is in agreement with the above plan. She will see me again in approximately 4 weeks. She knows to call for any problems that may develop before that date. e.  Chauncey Cruel, MD   02/25/2015 10:13 AM Medical Oncology and Hematology [facility] [address] [place], [place] [address] Tel. [phone]    Fax. [phone]

## 2015-02-25 NOTE — Therapy (Addendum)
[place], [place], [address] Phone: [phone]   Fax:  [phone]  Physical Therapy Evaluation  Patient Details  Name: Brooke [name] MRN: [id number] Date of Birth: 1965-02-19 Referring Provider: Dr. Alphonsa Overall  Encounter Date: 02/25/2015      PT End of Session - 02/25/15 0921    Visit Number 1   Number of Visits 1   PT Start Time 0958   PT Stop Time 1020   PT Time Calculation (min) 22 min   Activity Tolerance Patient tolerated treatment well   Behavior During Therapy Anxious  Very tearful      Past Medical History  Diagnosis Date  . Anemia   . Depression, postpartum   . Yeast infection   . Fatigue   . Chronic headaches   . Anxiety   . Breast cancer of upper-inner quadrant of right female breast ([place]) 02/18/2015  . Hot flashes     Past Surgical History  Procedure Laterality Date  . Gastric bypass    . Tubal ligation    . Cholecystectomy      There were no vitals filed for this visit.  Visit Diagnosis:  Carcinoma of right breast upper inner quadrant ([place]) - Plan: PT plan of care cert/re-cert  Abnormal posture - Plan: PT plan of care cert/re-cert      Subjective Assessment - 02/25/15 0921    Subjective Patient was seen today for a baseline assessment of her newly diagnosed right breast cancer.  Her cse was discussed in the breast multidisciplinary clinic among her medical team and a treatment plan determined.   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  She has 2 areas which are both in the upper inner quadrant measuring 10 cm apart.  One area is a 2 cm mass and the other is a area of calcs.  Both are ER/PR positive and HER2 negative with a Ki67 of 25%.     Patient Stated Goals Reduce lymphedema risk and learn post op shoulder ROM HEP            [facility] PT Assessment - 02/25/15 0001    Assessment   Medical Diagnosis Right breast  cancer   Referring Provider Dr. Alphonsa Overall   Onset Date/Surgical Date 01/30/15   Hand Dominance Right   Prior Therapy none   Precautions   Precautions Other (comment)   Precaution Comments Active breast cancer   Restrictions   Weight Bearing Restrictions No   Balance Screen   Has the patient fallen in the past 6 months No   Has the patient had a decrease in activity level because of a fear of falling?  No   Is the patient reluctant to leave their home because of a fear of falling?  No   Home Environment   Living Environment Private residence   Living Arrangements Spouse/significant other;Children  Husband and 21 y.o. daughter   Available Help at Discharge Family   Prior Function   Level of Independence Independent   Vocation Full time employment   Media planner   Leisure She does not exerciseShe goes to [employer] and does 40 minutes of cardio 3x/week   Cognition   Overall Cognitive Status Within Functional Limits for tasks assessed   Behaviors --  Very tearful during entire eval   Posture/Postural Control   Posture/Postural Control Postural limitations   Postural Limitations Rounded Shoulders;Forward head   ROM /  Strength   AROM / PROM / Strength AROM;Strength   AROM   AROM Assessment Site Shoulder   Right/Left Shoulder Right;Left   Right Shoulder Extension 43 Degrees   Right Shoulder Flexion 138 Degrees   Right Shoulder ABduction 150 Degrees   Right Shoulder Internal Rotation 72 Degrees   Right Shoulder External Rotation 80 Degrees   Left Shoulder Extension 60 Degrees   Left Shoulder Flexion 157 Degrees   Left Shoulder ABduction 150 Degrees   Left Shoulder Internal Rotation 62 Degrees   Left Shoulder External Rotation 83 Degrees   Strength   Overall Strength Within functional limits for tasks performed           LYMPHEDEMA/ONCOLOGY QUESTIONNAIRE - 02/25/15 0920    Type   Cancer Type Right breast cancer   Lymphedema  Assessments   Lymphedema Assessments Upper extremities   Right Upper Extremity Lymphedema   10 cm Proximal to Olecranon Process 34.3 cm   Olecranon Process 26.9 cm   10 cm Proximal to Ulnar Styloid Process 21.7 cm   Just Proximal to Ulnar Styloid Process 15.7 cm   Across Hand at [employer] 20.5 cm   At [place] of 2nd Digit 6.3 cm   Left Upper Extremity Lymphedema   10 cm Proximal to Olecranon Process 36 cm   Olecranon Process 27.5 cm   10 cm Proximal to Ulnar Styloid Process 21.7 cm   Just Proximal to Ulnar Styloid Process 15.9 cm   Across Hand at [employer] 20 cm   At [place] of 2nd Digit 6 cm       Patient was instructed today in a home exercise program today for post op shoulder range of motion. These included active assist shoulder flexion in sitting, scapular retraction, wall walking with shoulder abduction, and hands behind head external rotation.  She was encouraged to do these twice a day, holding 3 seconds and repeating 5 times when permitted by her physician.         PT Education - 02/25/15 0920    Education provided Yes   Education Details Lymphedema risk reduction and post op shoulder ROM HEP   Person(s) Educated Patient   Methods Explanation;Demonstration;Handout   Comprehension Verbalized understanding;Returned demonstration              Breast Clinic Goals - 02/25/15 [phone]    Patient will be able to verbalize understanding of pertinent lymphedema risk reduction practices relevant to her diagnosis specifically related to skin care.   Time 1   Period Days   Status Achieved   Patient will be able to return demonstrate and/or verbalize understanding of the post-op home exercise program related to regaining shoulder range of motion.   Time 1   Period Days   Status Achieved   Patient will be able to verbalize understanding of the importance of attending the postoperative After Breast Cancer Class for further lymphedema risk reduction education and  therapeutic exercise.   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  She has 2 areas which are both in the upper inner quadrant measuring 10 cm apart.  One area is a 2 cm mass and the other is a area of calcs.  Both are ER/PR positive and HER2 negative with a Ki67 of 25%.  She  is planning to have a right mastectomy and sentinel node biopsy with Oncotype testing followed by possible radiation and anti-estrogen therapy.  She may benefit from post op PT to regain shoulder ROM and reduce lymphedema risk.   Pt will benefit from skilled therapeutic intervention in order to improve on the following deficits Decreased strength;Pain;Decreased knowledge of precautions;Impaired UE functional use;Decreased range of motion   Rehab Potential Excellent   Clinical Impairments Affecting Rehab Potential none   PT Frequency 1x / week   PT Treatment/Interventions Therapeutic exercise;Patient/family education   PT Next Visit Plan Will f/u after surgery to determine needs   PT Home Exercise Plan Shoulder ROM post op HEP given today   Consulted and Agree with Plan of Care Patient;Family member/caregiver   Family Member Consulted Husband       Patient will follow up at outpatient cancer rehab if needed following surgery.  If the patient requires physical therapy at that time, a specific plan will be dictated and sent to the referring physician for approval. The patient was educated today on appropriate basic range of motion exercises to begin post operatively and the importance of attending the After Breast Cancer class following surgery.  Patient was educated today on lymphedema risk reduction practices as it pertains to recommendations that will benefit the patient immediately following surgery.  She verbalized good understanding.  No additional physical therapy is indicated at  this time.      Problem List Patient Active Problem List   Diagnosis Date Noted  . Breast cancer of upper-inner quadrant of right female breast ([place]) 02/18/2015  . Depression, postpartum   . Yeast infection   . Fatigue   . Chronic headaches     Annia Friendly, [place] 02/25/2015 11:28 AM   [place], [place], [address] Phone: [phone]   Fax:  [phone]  Name: KORISSA HORSFORD MRN: [id number] Date of Birth: May 04, 1965

## 2015-02-25 NOTE — Progress Notes (Signed)
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 female who had a screening mammogram at the [facility] on 02/10/15 per Dr. Kendall Flack. This detected a right breast mass and calcifications in the right breast. A diagnostic mammogram on 02/12/15 revealed a spiculated mass with pleomorphic calcifications measuring 1.5 cm in the upper inner quadrant of the right breast, anterior depth. Approximately 5.5 cm posterior to this mass is a more subtle area of density with pleomorphic calcifications measuring 1.0 cm. Ultrasound of the 12:30 o'clock position of the right breast, 2 cm from the nipple, showed an irregular mass measuring 1.9 x 1.6 x 2.2 cm. Ultrasound of the right axilla revealed normal appearing lymph nodes with preserved fatty hila. Biopsy of the 12:30 o'clock mass on 02/16/15 revealed grade 1-2 invasive ductal carcinoma with DCIS and calcifications (ER positive 95%, ER positive 45%, HER2 negative, Ki67 25%). The following day, a biopsy of the upper inner quadrant of the right breast revealed grade 1 invasive ductal carcinoma with DCIS and calcifications (ER positive 95%, ER positive 95%, HER2 negative, Ki67 25%). The site that was biopsied was approximately 10 cm posterior, superior, and medial to the site initially intended for targeting. The patient has been referred to breast multidisciplinary clinic for the management of her disease. She is a Education officer, museum.  She is accompanied by her husband. She is tearful during our conversation.   PREVIOUS RADIATION THERAPY: No  Past medical, social and family history were reviewed in the electronic chart. Review of symptoms was reviewed in the electronic  chart. Medications were reviewed in the electronic chart.   Gynecologic History  Age at first menstrual period? 13  Are you still having periods? Yes Approximate date of last period? 3 days  If you are still having periods: Are your periods regular? No  If you no longer have periods: Have you used hormone replacement? No Obstetric History:  How many children have you carried to term? 2 Your age at first live birth? 19  Pregnant now or trying to get pregnant? No  Have you used birth control pills or hormone shots for contraception? Yes.  If so, for how long (or approximate dates)? Depro (last 2000)  Would you be interested in learning more about the options to preserve fertility? No Health Maintenance:  Have you ever had a colonoscopy? No  Have you ever had a bone density? No  Date of your last PAP smear? 01/2015 Date of your FIRST mammogram? Age 50 in 2002  The patient complains of night sweats, loss of sleep wears glasses, sleeps on more than one pillow (two), forgetfulness, anxiety, depression, and hot flashes.  PHYSICAL EXAM:  Vitals with BMI 02/25/2015  Height '5\' 11"'   Weight 243 lbs 5 oz  BMI 34  Systolic 284  Diastolic 95  Pulse 65  Respirations 18  The patient is pleasant and alert. I did not perform a breast exam.  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 The MRI will  be performed to see if she has 5 cm of disease or more which is a concern given the biopsied calcifications were invasive cancer.  She will need a mastectomy regardless but this information can help [place] with reconstruction options if we know she will need RT.   I spoke to the patient today regarding her diagnosis and options for treatment. We  discussed the equivalence in terms of survival and local failure between mastectomy and breast conservation. We discussed the role of radiation in decreasing local failures in patients who undergo mastectomy and have risk factors for recurrence including positive lymph nodes and/or tumors over 5 cm and/or positive margins. We discussed the process of CT simulation and the placement tattoos. We discussed 6 weeks of treatment as an outpatient. We discussed the possibility of asymptomatic lung damage. We discussed the low likelihood of secondary malignancies. We discussed the possible side effects including but not limited to skin redness, fatigue, permanent skin darkening, and chest wall swelling. We discussed increased complications that can occur with reconstruction after radiation.  I spent 40 minutes  face to face with the patient and more than 50% of that time was spent in counseling and/or coordination of care.   ------------------------------------------------  Thea Silversmith, MD  This document serves as a record of services personally performed by Thea Silversmith, MD. It was created on her behalf by Darcus Austin, a trained medical scribe. The creation of this record is based on the scribe's personal observations and the provider's statements to them. This document has been checked and approved by the attending provider.

## 2015-02-25 NOTE — Progress Notes (Signed)
Subjective:     Patient ID: Brooke [name], female   DOB: 07-26-1965, 50 y.o.   MRN: [id number]  HPI   Review of Systems     Objective:   Physical Exam For the patient to understand and be given the tools to implement a healthy plant based diet during their cancer diagnosis.     Assessment:     Patient was seen today and found to be pleasant and accompanied by her husband. Pts labs are WNL. Pt is 5'11', 254 pounds, BMI 35.5. Pt is taking a multivitamin. Pt has had a gastric bypass in 2005. Pt states she has not been taking the recommended supplements for bypass and forgot all about them. Pt states some foods still feel like they get stuck and cause some stomach irritation. Pt states she eats often throughout the day. Pt was involved in the appointment.      Plan:     Dietitian educated the patient on implementing a plant based diet by incorporating more plant proteins, fruits, and vegetables. As a part of a healthy routine physical activity was discussed. Dietitian offered specific supplements for bariatric pts.  The importance of legitimate, evidence based information was discussed and examples were given. A folder of evidence based information with a focus on a plant based diet and general nutrition during cancer was given to the patient.  As a part of the continuum of care the cancer dietitian's contact information was given to the patient in the event they would like to have a follow up appointment.

## 2015-02-26 ENCOUNTER — Telehealth: Payer: Self-pay | Admitting: Oncology

## 2015-02-26 ENCOUNTER — Encounter: Payer: Self-pay | Admitting: Genetic Counselor

## 2015-02-26 ENCOUNTER — Ambulatory Visit (HOSPITAL_BASED_OUTPATIENT_CLINIC_OR_DEPARTMENT_OTHER): Payer: BC Managed Care – PPO | Admitting: Genetic Counselor

## 2015-02-26 ENCOUNTER — Other Ambulatory Visit: Payer: BC Managed Care – PPO

## 2015-02-26 DIAGNOSIS — C50211 Malignant neoplasm of upper-inner quadrant of right female breast: Secondary | ICD-10-CM

## 2015-02-26 DIAGNOSIS — C50911 Malignant neoplasm of unspecified site of right female breast: Secondary | ICD-10-CM

## 2015-02-26 DIAGNOSIS — Z315 Encounter for genetic counseling: Secondary | ICD-10-CM

## 2015-02-26 DIAGNOSIS — Z809 Family history of malignant neoplasm, unspecified: Secondary | ICD-10-CM

## 2015-02-26 NOTE — Progress Notes (Signed)
REFERRING PROVIDER: Lurline Del, MD  PRIMARY PROVIDER:  Eldred Manges, MD  PRIMARY REASON FOR VISIT:  1. Breast cancer of upper-inner quadrant of right female breast ([place])   2. Family history of cancer   3. Malignant neoplasm of right female breast, unspecified site of breast ([place])      HISTORY OF PRESENT ILLNESS:   Ms. Guaman, a 50 y.o. female, was seen for a [place] cancer genetics consultation at the request of Dr. Jana Hakim due to a personal history of two breast cancer tumors at age 7.  Ms. Hulgan presents to clinic today to discuss the possibility of a hereditary predisposition to cancer, genetic testing, and to further clarify her future cancer risks, as well as potential cancer risks for family members.   In February 2017, at the age of 86, Ms. Weltman was diagnosed with invasive ductal carcinoma with DCIS and calcifications of the right breast.  Hormone receptor status was ER+ (95%), PR+ (95%), and HER2-.  A second area of Ms. Maugeri's right breast, approximately 10 cm away, was biopsied and was found to be an invasive ductal carcinoma with DCIS and calcifications.  Hormone receptor status of this tumor was ER+ (95%), PR+ (45%), and HER2-. This will likely be treated with either mastectomy or double mastectomy; surgery will be informed by genetic test results.    CANCER HISTORY:   No history exists.     HORMONAL RISK FACTORS:  Menarche was at age 28.  First live birth at age 29.  OCP use - DEPO PROVERA for approximately 5 years.  Ovaries intact: yes.  Hysterectomy: no.  Menopausal status: perimenopausal.  HRT use: 0 years. Colonoscopy: no; not examined. Mammogram within the last year: previously had skipped a couple of mammograms. Number of breast biopsies: 2. Up to date with pelvic exams:  yes. Any excessive radiation exposure in the past:  no  Past Medical History  Diagnosis Date  . Anemia   . Depression, postpartum   . Yeast infection   . Fatigue   . Chronic  headaches   . Anxiety   . Breast cancer of upper-inner quadrant of right female breast ([place]) 02/18/2015  . Hot flashes     Past Surgical History  Procedure Laterality Date  . Gastric bypass    . Tubal ligation    . Cholecystectomy      Social History   Social History  . Marital Status: Married    Spouse Name: N/A  . Number of Children: N/A  . Years of Education: N/A   Social History Main Topics  . Smoking status: Never Smoker   . Smokeless tobacco: Never Used  . Alcohol Use: 2.0 - 2.5 oz/week    4-5 Standard drinks or equivalent per week     Comment: wine and beer  . Drug Use: No  . Sexual Activity: Yes    Birth Control/ Protection: Surgical   Other Topics Concern  . Not on file   Social History Narrative     FAMILY HISTORY:  We obtained a detailed, 4-generation family history.  Significant diagnoses are listed below: Family History  Problem Relation Age of Onset  . Diabetes Mother   . Hypertension Mother   . Spinal muscular atrophy Mother   . Irregular heart beat Mother   . Anxiety disorder Mother   . Arthritis Mother   . Cancer Other     maternal great grandfather dx. unspecified type cancer  . Heart attack Maternal Grandmother   . Heart  attack Maternal Uncle   . Cancer Cousin     paternal 1st cousin dx. with cancer that was typically a childhood cancer    Ms. Boehner has one son, age 59, and one daughter, age 37.  She has no grandchildren currently.  Ms. Raisanen has one paternal half-sister who is currently 45.  She has four maternal half-brothers and one maternal half-sister, all between the ages of 2 and 61.  Ms. Harnish mother died of non-alcoholic cirrhosis of the liver at the age of 59.  Her father died at an unspecified age.  Ms. Buccieri reports no cancer history in her immediate family.  Ms. Weldon mother had one full sister, three full brothers, and one maternal half-sister.  One brother died of a heart attack in his late 80s-early 60s.   The other siblings  are still living and are in their late 84s-late 60s.  Ms. Godbey maternal grandmother died following hospitalization after a massive heart attack at the age of 74.  Ms. Broadfoot reports that her grandmother's father was diagnosed with an unspecified type of cancer, but has no further information about this.  Ms. Gowens maternal grandfather died about 16 years ago following an accident/fire.  Ms. Hayworth has limited information for her maternal great aunts/uncles and great grandparents.  Ms. Woodin has very limited information for her father's side of the family.  She reports that her father had maybe two sisters.  One sister had a daughter who was diagnosed with a cancer that was "typically a childhood cancer" at a later age in life, but Ms. Ciliberto has no further information.    Patient's maternal ancestors and paternal ancestors are of [place] American descent. There is no reported Ashkenazi Jewish ancestry. There is no known consanguinity.  GENETIC COUNSELING ASSESSMENT: DARIN REDMANN is a 50 y.o. female with a personal history of breast cancer which is somewhat suggestive of a hereditary breast cancer syndrome and predisposition to cancer. We, therefore, discussed and recommended the following at today's visit.   DISCUSSION: We reviewed the characteristics, features and inheritance patterns of hereditary cancer syndromes, particularly those caused by mutations within the BRCA1/2 genes. We also discussed genetic testing, including the appropriate family members to test, the process of testing, insurance coverage and turn-around-time for results. We discussed the implications of a negative, positive and/or variant of uncertain significant result.  We reviewed small and large panel test options, including the benefits of both.  Ms. Burgin would like to have genetic testing of the 20 gene panel.  Thus, we recommended Ms. Corey pursue genetic testing for the 20-gene Breast/Ovarian Cancer Panel through [facility].   The Breast/Ovarian Cancer Panel offered by [facility] Hope Pigeon, MD) includes sequencing and deletion/duplication analysis for the following 19 genes:  ATM, BARD1, BRCA1, BRCA2, BRIP1, CDH1, CHEK2, FANCC, MLH1, MSH2, MSH6, NBN, PALB2, PMS2, PTEN, RAD51C, RAD51D, TP53, and XRCC2.  This panel also includes deletion/duplication analysis (without sequencing) for one gene, EPCAM.  Based on Ms. Osten's personal history of cancer, she meets medical criteria for genetic testing. Despite that she meets criteria, she may still have an out of pocket cost. We discussed that if her out of pocket cost for testing is over $100, the laboratory will call and confirm whether she wants to proceed with testing.  If the out of pocket cost of testing is less than $100 she will be billed by the genetic testing laboratory.   PLAN: After considering the risks, benefits, and limitations, Ms. Hursey  provided  informed consent to pursue genetic testing and the blood sample was sent to [facility] for analysis of the 20-gene Breast/Ovarian Cancer Panel test. Results should are generally available within approximately 2-3 weeks' time, however, we will ask that [facility] return these results STAT, since Ms. Pettibone and her doctor will use this information to make important surgical/treatment decisions.  Thus, we should be able to expect these results closer to the 2-week time frame at which point they will be disclosed by telephone to Ms. Weigelt, as will any additional recommendations warranted by these results. Ms. Zou will receive a summary of her genetic counseling visit and a copy of her results once available. This information will also be available in Epic. We encouraged Ms. Courts to remain in contact with cancer genetics annually so that we can continuously update the family history and inform her of any changes in cancer genetics and testing that may be of benefit for her family. Ms. Marlette questions were answered to her  satisfaction today. Our contact information was provided should additional questions or concerns arise.  Thank you for the referral and allowing [place] to share in the care of your patient.   Jeanine Luz, MS Genetic Counselor [email or web address] Phone: [phone]  The patient was seen for a total of 40 minutes in face-to-face genetic counseling.  This patient was discussed with Drs. Magrinat, Lindi Adie and/or Burr Medico who agrees with the above.    _______________________________________________________________________ For Office Staff:  Number of people involved in session: 1 Was an Intern/ student involved with case: no

## 2015-02-26 NOTE — Telephone Encounter (Signed)
S/w pt gave appt 3/24 @ 11am.

## 2015-02-27 ENCOUNTER — Ambulatory Visit
Admission: RE | Admit: 2015-02-27 | Discharge: 2015-02-27 | Disposition: A | Discharge status: Home / Self Care | Payer: BC Managed Care – PPO | Source: Ambulatory Visit | Attending: Surgery | Admitting: Surgery

## 2015-02-27 ENCOUNTER — Telehealth: Payer: Self-pay | Admitting: *Deleted

## 2015-02-27 DIAGNOSIS — C50211 Malignant neoplasm of upper-inner quadrant of right female breast: Secondary | ICD-10-CM

## 2015-02-27 MED ORDER — GADOBENATE DIMEGLUMINE 529 MG/ML IV SOLN
20.0000 mL | Freq: Once | INTRAVENOUS | Status: AC | PRN
  Administered 2015-02-27: 20 mL via INTRAVENOUS

## 2015-02-27 NOTE — Telephone Encounter (Signed)
Spoke with patient from [facility] 02/25/15.  She is doing well.  Encouraged her to call with any needs or concerns.  

## 2015-03-02 ENCOUNTER — Encounter: Payer: Self-pay | Admitting: Oncology

## 2015-03-02 ENCOUNTER — Telehealth: Payer: Self-pay | Admitting: *Deleted

## 2015-03-02 NOTE — Progress Notes (Signed)
[place] Psychosocial Distress Screening Spiritual Care  Spiritual Care was referred by distress screening protocol.  The patient scored a 8 on the Psychosocial Distress Thermometer which indicates severe distress. Followed up with Brooke [name] by phone to assess for distress and other psychosocial needs.   ONCBCN DISTRESS SCREENING 03/02/2015  Screening Type Initial Screening  Distress experienced in past week (1-10) 8  Practical problem type Work/school  Emotional problem type Depression;Nervousness/Anxiety;Adjusting to illness;Isolation/feeling alone;Feeling hopeless  Information Concerns Type Lack of info about diagnosis;Lack of info about treatment;Lack of info about complementary therapy choices;Lack of info about maintaining fitness  Physical Problem type Sleep/insomnia  Referral to support programs Yes   Spoke briefly with Brooke [name] by phone today.  Per pt, she anxiously awaits MRI results and has financial questions.  She verbalized strong interest in [facility] team and plans to return my call this pm to discuss details.  Follow up needed: Yes.   Plan to f/u by phone if needed.  Will also refer to LCSW for financial/practical/emotional support.  Roy, [place], [facility] Pager [phone] Voicemail [phone]

## 2015-03-02 NOTE — Progress Notes (Signed)
Contacted patient to follow up on financial concern.Patient states she wasn't sure how much she was going to be responsible for for her surgery. Patient has 2 insurances. Advised patient that her primary insurance BCBS would be billed first and then secondary Tricare. Advised patient she may want to call Tricare to find out what percentage of her care they would be taking care of first. It doesn't look like chemotherapy is a part of her treatment plan, therefore I don't have any financial resources that would help her bills. Will refer to [facility] in [facility] if needed.

## 2015-03-02 NOTE — Telephone Encounter (Signed)
Received order per Dr. Magrinat for oncotype testing. Requisition sent to pathology. Received by Tammy. 

## 2015-03-03 ENCOUNTER — Other Ambulatory Visit: Payer: Self-pay | Admitting: Surgery

## 2015-03-03 DIAGNOSIS — R9389 Abnormal findings on diagnostic imaging of other specified body structures: Secondary | ICD-10-CM

## 2015-03-04 ENCOUNTER — Other Ambulatory Visit: Payer: Self-pay | Admitting: Surgery

## 2015-03-04 ENCOUNTER — Encounter: Payer: Self-pay | Admitting: Oncology

## 2015-03-04 ENCOUNTER — Encounter: Payer: Self-pay | Admitting: *Deleted

## 2015-03-04 DIAGNOSIS — R9389 Abnormal findings on diagnostic imaging of other specified body structures: Secondary | ICD-10-CM

## 2015-03-04 NOTE — Progress Notes (Signed)
[place] Work  Clinical Social Work was referred by Clinical biochemist for assessment of psychosocial needs.  Clinical Social Worker attempted to contact patient at home to offer support and assess for needs.  CSW introduced self, explained role of CSW. Pt could not talk, was at work and plans to return [place] call. Through chart review, pt appears to have plan for surgery only and has two insurances currently. Pt connected with Shauna in financial counseling as well and not much to offer currently. CSW awaits return call and will support as appropriate.     Loren Racer, [place] Worker [place]  [place] Phone: [phone] Fax: [phone]

## 2015-03-06 ENCOUNTER — Telehealth: Payer: Self-pay

## 2015-03-06 NOTE — Telephone Encounter (Signed)
Patient called requesting an anti anxiety medication- she is have a needle biopsy on Monday and she is very nervous.  She states that she has taken valium several times in the past and that works "the best to calm my nerves".  Patient [facility] .

## 2015-03-09 ENCOUNTER — Ambulatory Visit: Payer: Self-pay | Admitting: Genetic Counselor

## 2015-03-09 ENCOUNTER — Telehealth: Payer: Self-pay

## 2015-03-09 ENCOUNTER — Other Ambulatory Visit: Payer: Self-pay | Admitting: *Deleted

## 2015-03-09 ENCOUNTER — Telehealth: Payer: Self-pay | Admitting: Genetic Counselor

## 2015-03-09 DIAGNOSIS — Z809 Family history of malignant neoplasm, unspecified: Secondary | ICD-10-CM

## 2015-03-09 DIAGNOSIS — Z1379 Encounter for other screening for genetic and chromosomal anomalies: Secondary | ICD-10-CM | POA: Insufficient documentation

## 2015-03-09 DIAGNOSIS — C50211 Malignant neoplasm of upper-inner quadrant of right female breast: Secondary | ICD-10-CM

## 2015-03-09 MED ORDER — DIAZEPAM 5 MG PO TABS: ORAL_TABLET | ORAL | Status: DC

## 2015-03-09 NOTE — Telephone Encounter (Signed)
Discussed with Brooke [name] that her genetic test result was negative for known pathogenic mutations within any of 20 genes that would cause her to be at an increased risk for breast, ovarian, or other related cancers.  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  We discussed that we treat this just like a negative result until it gets updated by the lab and reviewed why we do that.  Encouraged Brooke [name] to keep her contact information up to date with [place], so that we can call and let her know if/when this result gets reclassified by the lab.  Discussed that most likely this is a reassuring result for [place], especially since so few of Brooke [name]'s relatives have been diagnosed with cancer.  Discussed that women in the family are still at a somewhat increased risk for breast cancer since there is now a history in the family.  It will be important for her sisters to continue to have annual mammogram and clinical/self breast exams.  Brooke [name] daughter and nieces should make their future primary doctors aware of the cancer history and should begin annual mammogram screening at the age of 64.  Brooke [name] is happy to receive this news, she knows she is always welcome to call me with any questions she may have.

## 2015-03-09 NOTE — Telephone Encounter (Signed)
Patient is calling again requesting an anti anxiety medication- she is have a needle biopsy on Tuesday although on Friday stated it was today -she is very nervous. She states that she has taken valium several times in the past and that works "the best to calm my nerves". Patient [facility]

## 2015-03-09 NOTE — Progress Notes (Signed)
GENETIC TEST RESULT  HPI: Ms. 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 Please refer to our prior cancer genetics clinic note from February 26, 2015 for more information regarding Ms. Norgren's medical, social and family histories, and our assessment and recommendations, at the time. Ms. Giese recent genetic test results were disclosed to her, as were recommendations warranted by these results. These results and recommendations are discussed in more detail below.  GENETIC TEST RESULTS: At the time of Ms. Hagood's visit on 02/26/15, we recommended she pursue genetic testing of the 20-gene Breast/Ovarian Cancer Panel through [facility].  The Breast/Ovarian Cancer Panel offered by [facility] Hope Pigeon, MD) includes sequencing and deletion/duplication analysis for the following 19 genes:  ATM, BARD1, BRCA1, BRCA2, BRIP1, CDH1, CHEK2, FANCC, MLH1, MSH2, MSH6, NBN, PALB2, PMS2, PTEN, RAD51C, RAD51D, TP53, and XRCC2.  This panel also includes deletion/duplication analysis (without sequencing) for one gene, EPCAM.  Those results are now back, the report date for which is March 06, 2015.  Genetic testing was normal, and did not reveal a deleterious mutation in these genes.  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  The test report will be scanned into EPIC and will be located under the Results Review tab in the Pathology>Molecular Pathology section.   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 At this time, it is unknown if this VUS is associated with an increased risk for cancer or if this is a normal finding. Since this VUS result is  uncertain, it cannot help guide screening recommendations, and family members should not be tested for this VUS to help define their own cancer risks.  Also, we all have variants within our genes that make [place] unique individuals--most of these variants are benign.  Thus, we treat this VUS as a negative result.   With time, we suspect the lab will reclassify this variant and when they do, we will try to re-contact Ms. Dura to discuss the reclassification further.  We also encouraged Ms. Aronov to contact us in a year or two to obtain an update on the status of this VUS.  We discussed with Ms. Plaia that since the current genetic testing is not perfect, it is possible there may be a gene mutation in one of these genes that current testing cannot detect, but that chance is small. We also discussed, that it is possible that another gene that has not yet been discovered, or that we have not yet tested, is responsible for the cancer diagnoses in the family, and it is, therefore, important to remain in touch with cancer genetics in the future so that we can continue to offer Ms. Carreiro the most up to date genetic testing.   CANCER SCREENING RECOMMENDATIONS: Though we still do not have an explanation for the personal history of breast cancer, this result is reassuring and indicates that Ms. Mezquita likely does not have an increased risk for a future cancer due to a mutation in one of these genes. This normal test also suggests that Ms. Barkan's cancer was most likely not due to an inherited predisposition associated with one of these genes.  Additionally, many of Ms. Glaeser's family members have never  been diagnosed with cancer.  Most cancers happen by chance and this negative test suggests that her cancer falls into this category.  We, therefore, recommended she continue to follow the cancer management and screening guidelines provided by her oncology and primary healthcare providers.   RECOMMENDATIONS FOR FAMILY MEMBERS: Women  in this family might be at some increased risk of developing cancer, over the general population risk, simply due to the family history of cancer. We recommended women in this family have a yearly mammogram beginning at age 45, or 9 years younger than the earliest onset of cancer, an an annual clinical breast exam, and perform monthly breast self-exams.  This includes Ms. Blacksher's daughter, half-sisters, and nieces.  Women in this family should also have a gynecological exam as recommended by their primary provider. All family members should have a colonoscopy by age 33.  FOLLOW-UP: Lastly, we discussed with Ms. Boudreau that cancer genetics is a rapidly advancing field and it is possible that new genetic tests will be appropriate for her and/or her family members in the future. We encouraged her to remain in contact with cancer genetics on an annual basis so we can update her personal and family histories and let her know of advances in cancer genetics that may benefit this family.   Our contact number was provided. Ms. Lippens questions were answered to her satisfaction, and she knows she is welcome to call us at anytime with additional questions or concerns.   Jeanine Luz, MS, [facility] Certified Genetic Counselor [email or web address] Phone: [phone]

## 2015-03-09 NOTE — Telephone Encounter (Signed)
Called prescription to pharmacy and attempted to contact pt at given number. Obtained identified VM but then informed VM box is full and message can not be left.

## 2015-03-10 ENCOUNTER — Ambulatory Visit
Admission: RE | Admit: 2015-03-10 | Discharge: 2015-03-10 | Disposition: A | Discharge status: Home / Self Care | Payer: BC Managed Care – PPO | Source: Ambulatory Visit | Attending: Surgery | Admitting: Surgery

## 2015-03-10 ENCOUNTER — Encounter (HOSPITAL_COMMUNITY): Payer: Self-pay

## 2015-03-10 ENCOUNTER — Encounter: Payer: Self-pay | Admitting: *Deleted

## 2015-03-10 ENCOUNTER — Other Ambulatory Visit: Payer: BC Managed Care – PPO

## 2015-03-10 ENCOUNTER — Telehealth: Payer: Self-pay | Admitting: *Deleted

## 2015-03-10 DIAGNOSIS — R9389 Abnormal findings on diagnostic imaging of other specified body structures: Secondary | ICD-10-CM

## 2015-03-10 MED ORDER — GADOBENATE DIMEGLUMINE 529 MG/ML IV SOLN
20.0000 mL | Freq: Once | INTRAVENOUS | Status: AC | PRN
  Administered 2015-03-10: 20 mL via INTRAVENOUS

## 2015-03-10 NOTE — Progress Notes (Signed)
Received oncotype score of 5.  Copy to Dr. Jana Hakim and copy to medical records to scan.

## 2015-03-11 ENCOUNTER — Telehealth: Payer: Self-pay | Admitting: *Deleted

## 2015-03-11 NOTE — Telephone Encounter (Signed)
Left message to discuss oncotype results.

## 2015-03-13 ENCOUNTER — Other Ambulatory Visit: Payer: Self-pay | Admitting: Surgery

## 2015-03-13 DIAGNOSIS — C50911 Malignant neoplasm of unspecified site of right female breast: Secondary | ICD-10-CM

## 2015-03-13 DIAGNOSIS — N6092 Unspecified benign mammary dysplasia of left breast: Secondary | ICD-10-CM

## 2015-03-20 ENCOUNTER — Other Ambulatory Visit: Payer: Self-pay | Admitting: Surgery

## 2015-03-20 DIAGNOSIS — N6092 Unspecified benign mammary dysplasia of left breast: Secondary | ICD-10-CM

## 2015-03-20 NOTE — Telephone Encounter (Signed)
error 

## 2015-03-27 ENCOUNTER — Telehealth: Payer: Self-pay | Admitting: Oncology

## 2015-03-27 ENCOUNTER — Ambulatory Visit (HOSPITAL_BASED_OUTPATIENT_CLINIC_OR_DEPARTMENT_OTHER): Payer: BC Managed Care – PPO | Admitting: Oncology

## 2015-03-27 ENCOUNTER — Encounter: Payer: Self-pay | Admitting: Oncology

## 2015-03-27 VITALS — BP 125/54 | HR 76 | Temp 97.8°F | Resp 18 | Ht 71.0 in | Wt 246.0 lb

## 2015-03-27 DIAGNOSIS — Z170 Estrogen receptor positive status [ER+]: Secondary | ICD-10-CM

## 2015-03-27 DIAGNOSIS — Z79810 Long term (current) use of selective estrogen receptor modulators (SERMs): Secondary | ICD-10-CM

## 2015-03-27 DIAGNOSIS — C50211 Malignant neoplasm of upper-inner quadrant of right female breast: Secondary | ICD-10-CM

## 2015-03-27 NOTE — Progress Notes (Signed)
Patient came in requesting financial assistance. Patient states her concern is assistance with surgeon bills. Patient states they do not accept her Tricare. Advised patient that she may want to contact Tricare to see if she may be able to get reimbursed for money she has to pay out. Patient agreed. Reviewed patient's chart and she is currently not on treatment. Advised patient once her surgery is complete and treatment plan has been established, she then may discuss other financial assistance. Patient has my card for any additional financial questions or concerns.

## 2015-03-27 NOTE — Telephone Encounter (Signed)
appt made and avs printed °

## 2015-03-27 NOTE — Progress Notes (Signed)
Verified abbreviation ADH with Raquel Sarna in the nursing office at [facility].Atypical Ductal Hyperplasia.

## 2015-03-27 NOTE — Pre-Procedure Instructions (Signed)
CONLEIGH SO  03/27/2015      [id number] [place], [place] [phone] [facility] [place] [address] [place] [address] Phone: [phone] Fax: [phone]    Your procedure is scheduled on Mon, April 3 @ 11:05 AM  Report to [facility] Admitting at 9:00 AM  Call this number if you have problems the morning of surgery:  [phone]   Remember:  Do not eat food or drink liquids after midnight.  Take these medicines the morning of surgery with A SIP OF WATER Valium(Diazepam) and Tamoxifen(Nolvadex)             No Goody's,BC's,Aleve,Aspirin,Ibuprofen,Motrin,Advil,Fish Oil,or any Herbal Medications.                 Do not wear jewelry, make-up or nail polish.  Do not wear lotions, powders, or perfumes.    Do not shave 48 hours prior to surgery.    Do not bring valuables to the hospital.  [facility] is not responsible for any belongings or valuables.  Contacts, dentures or bridgework may not be worn into surgery.  Leave your suitcase in the car.  After surgery it may be brought to your room.  For patients admitted to the hospital, discharge time will be determined by your treatment team.  Patients discharged the day of surgery will not be allowed to drive home.    [facility]  Before surgery, you can play an important role.  Because skin is not sterile, your skin needs to be as free of germs as possible.  You can reduce the number of germs on you skin by washing with CHG (chlorahexidine gluconate) soap before surgery.  CHG is an antiseptic cleaner which kills germs and bonds with the skin to continue killing germs even after washing.  Please DO NOT use if you have an allergy to CHG or antibacterial soaps.  If your skin becomes reddened/irritated stop using the CHG and inform your nurse when you arrive at Short Stay.  Do not shave (including legs and underarms) for at least 48 hours  prior to the first CHG shower.  You may shave your face.  Please follow these instructions carefully:   1.  Shower with CHG Soap the night before surgery and the                                morning of Surgery.  2.  If you choose to wash your hair, wash your hair first as usual with your       normal shampoo.  3.  After you shampoo, rinse your hair and body thoroughly to remove the                      Shampoo.  4.  Use CHG as you would any other liquid soap.  You can apply chg directly       to the skin and wash gently with scrungie or a clean washcloth.  5.  Apply the CHG Soap to your body ONLY FROM THE NECK DOWN.        Do not use on open wounds or open sores.  Avoid contact with your eyes,       ears, mouth and genitals (private parts).  Wash genitals (private parts)       with your normal  soap.  6.  Wash thoroughly, paying special attention to the area where your surgery        will be performed.  7.  Thoroughly rinse your body with warm water from the neck down.  8.  DO NOT shower/wash with your normal soap after using and rinsing off       the CHG Soap.  9.  Pat yourself dry with a clean towel.            10.  Wear clean pajamas.            11.  Place clean sheets on your bed the night of your first shower and do not        sleep with pets.  Day of Surgery  Do not apply any lotions/deoderants the morning of surgery.  Please wear clean clothes to the hospital/surgery center.  tions:    Please read over the following fact sheets that you were given. Pain Booklet, Coughing and Deep Breathing and Surgical Site Infection Prevention

## 2015-03-27 NOTE — Progress Notes (Signed)
[place]  [phone]) [phone] [phone]     ID: Brooke [name] DOB: April 05, 1965  MR#: [id number]  FWY#:[id number]  Patient Care Team: Brooke Manges, MD as PCP - General (Obstetrics and Gynecology) Brooke Cruel, MD as Consulting Physician (Oncology) Brooke Silversmith, MD as Consulting Physician (Radiation Oncology) Brooke Overall, MD as Consulting Physician (General Surgery) Brooke Cheese, NP as Nurse Practitioner (Hematology and Oncology) PCP: Brooke Manges, MD OTHER MD: [name]  CHIEF COMPLAINT: Estrogen receptor positive breast cancer  CURRENT TREATMENT: awaiting definitive surgery   BREAST CANCER HISTORY: From the original intake note:  Brooke [name] had routine gynecologic follow-up with Dr. Caralee [name] office and as part of that exam Brooke [name] had screening bilateral mammography which showed a possible mass and calcifications in the right breast. Brooke [name] was referred to the [facility] where on 02/12/2015 Brooke [name] had a right diagnostic mammography with tomo synthesis and right breast ultrasonography. In the upper inner quadrant of the right breast there was a spiculated mass measuring approximately 1.5 cm. Posterior to this mass, approximately 5.5 cm away, there was a subtle area of pleomorphic calcifications spanning 1 cm. By physical exam the upper inner quadrant right breast mass was palpable 2-3 cm from the nipple. By ultrasound this proved to be irregular and heterogeneous and measuring approximately 2.2 cm. The right axilla was benign.  On 02/16/2015 the patient underwent biopsy of the right breast mass in question and also a more posterior area of calcifications, not seen on the original mammogram, 10 cm away from the biopsied mass. 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 Biopsy of the more posterior area of calcifications also showed invasive as well as in situ ductal carcinoma, grade 1 or 2, estrogen receptor 95% positive, with strong staining intensity, progesterone receptor 45% positive, with moderate staining intensity, with an MIB-1 of 25%, and HER-2 also negative, with a signals ratio of 1.48 and the number per cell being 2.00.  The patient's subsequent history is as detailed below  INTERVAL HISTORY: Brooke [name] Returns today for follow-up of her breast cancer. Since her last visit here Brooke [name] had biopsy of the 2 suspicious areas in her left breast, 03/10/2015. The final pathology (SAA [id number]) showed atypical ductal hyperplasia in the upper outer quadrant lesion. The more posterior lesion in the same quadrant was benign. We also obtained an Oncotype score on her right breast tumor and this showed a score of 5, predicting a risk of recurrence outside the breast within 10 years of 5% of the patient's only systemic therapy is tamoxifen for 5 years. It also predicts no benefit from chemotherapy.  REVIEW OF SYSTEMS: Brooke [name] continues on tamoxifen with excellent tolerance. Brooke [name] obtains it at a good price. Sheis feeling much more reconciled to her current situation. Sshe is comfortable with the [name] plan. Brooke [name] does describe herself as anxious and depressed, as well as fatigued. Brooke [name] has some hot flashes. Otherwise Brooke [name] did well with her biopsies and is looking for to her upcoming surgery.  PAST MEDICAL HISTORY: Past Medical History  Diagnosis Date  . Anemia   . Depression, postpartum   . Yeast infection   . Fatigue   . Chronic headaches   . Anxiety   . Breast cancer of upper-inner quadrant of right female  breast ([place]) 02/18/2015  . Hot flashes     PAST SURGICAL HISTORY: Past Surgical History  Procedure Laterality Date  . Gastric bypass    . Tubal ligation    . Cholecystectomy      FAMILY  HISTORY Family History  Problem Relation Age of Onset  . Diabetes Mother   . Hypertension Mother   . Spinal muscular atrophy Mother   . Irregular heart beat Mother   . Anxiety disorder Mother   . Arthritis Mother   . Cancer Other     maternal great grandfather dx. unspecified type cancer  . Heart attack Maternal Grandmother   . Heart attack Maternal Uncle   . Cancer Cousin     paternal 1st cousin dx. with cancer that was typically a childhood cancer   the patient has little information on her father's side of the family. Her mother died of complications of nonalcoholic cirrhosis of the age of 78. The patient had 4 brothers, 2 sisters. A maternal great-grandfather reportedly had cancer of some type, but Brooke [name] has no further information.  GYNECOLOGIC HISTORY:  No LMP recorded.  menarche age 21, first live birth age 57, the patient is [name] P2. Brooke [name] has periods fairly irregularly at present and they last about 3 days. Brooke [name] used Depo Provera last in 2000.   SOCIAL HISTORY:   Brooke [name] teaches elementary school. Her husband Brooke [name] is a Customer service manager. Son Brooke [name] (pronounced "X--Brooke [name]") is a Environmental consultant at the [employer] in [place]. [name] 16 is at home with the patient. There are no grandchildren. The patient attends the [employer] in [place]: not in place    HEALTH MAINTENANCE: Social History  Substance Use Topics  . Smoking status: Never Smoker   . Smokeless tobacco: Never Used  . Alcohol Use: 2.0 - 2.5 oz/week    4-5 Standard drinks or equivalent per week     Comment: wine and beer     Colonoscopy: never   PAP: February 2017  Bone density: never   Lipid panel:  No Known Allergies  Current Outpatient Prescriptions  Medication Sig Dispense Refill  . acetaminophen (TYLENOL) 325 MG tablet Take 325 mg by mouth as needed.    Marland Kitchen aspirin-sod bicarb-citric acid (ALKA-SELTZER) 325 MG TBEF tablet Take 325 mg by mouth as needed.    .  diazepam (VALIUM) 5 MG tablet 1 po prior to procedure as needed 5 tablet 0  . Multiple Vitamins-Minerals (MULTIVITAMIN ADULT PO) Take by mouth daily.    . tamoxifen (NOLVADEX) 20 MG tablet Take 1 tablet (20 mg total) by mouth daily. 90 tablet 12   No current facility-administered medications for this visit.    OBJECTIVE: middle-aged African-American woman in no acute distress Filed Vitals:   03/27/15 1110  BP: 125/54  Pulse: 76  Temp: 97.8 F (36.6 C)  Resp: 18     Body mass index is 34.33 kg/(m^2).    ECOG FS:0 - Asymptomatic  Sclerae unicteric, pupils round and equal Oropharynx clear and moist-- no thrush or other lesions No cervical or supraclavicular adenopathy Lungs no rales or rhonchi Heart regular rate and rhythm Abd soft, nontender, positive bowel sounds MSK no focal spinal tenderness, no upper extremity lymphedema Neuro: nonfocal, well oriented, appropriate affect Breasts: Deferred    LAB RESULTS:  CMP     Component Value Date/Time   NA 138 02/25/2015 0837   NA 134* 03/14/2014 1354   K 4.5 02/25/2015  0837   K 4.1 03/14/2014 1354   CL 101 03/14/2014 1354   CO2 24 02/25/2015 0837   CO2 24 03/14/2014 1354   GLUCOSE 108 02/25/2015 0837   GLUCOSE 81 03/14/2014 1354   BUN 7.3 02/25/2015 0837   BUN 9 03/14/2014 1354   CREATININE 0.8 02/25/2015 0837   CREATININE 0.66 03/14/2014 1354   CALCIUM 8.8 02/25/2015 0837   CALCIUM 8.7 03/14/2014 1354   PROT 6.7 02/25/2015 0837   PROT 6.8 03/14/2014 1354   ALBUMIN 3.6 02/25/2015 0837   ALBUMIN 3.9 03/14/2014 1354   AST 18 02/25/2015 0837   AST 22 03/14/2014 1354   ALT 25 02/25/2015 0837   ALT 19 03/14/2014 1354   ALKPHOS 64 02/25/2015 0837   ALKPHOS 51 03/14/2014 1354   BILITOT 0.87 02/25/2015 0837   BILITOT 0.4 03/14/2014 1354   GFRNONAA >89 03/14/2014 1354   GFRAA >89 03/14/2014 1354    INo results found for: SPEP, UPEP  Lab Results  Component Value Date   WBC 4.6 02/25/2015   NEUTROABS 2.2 02/25/2015    HGB 12.0 02/25/2015   HCT 37.3 02/25/2015   MCV 97.6 02/25/2015   PLT 295 02/25/2015      Chemistry      Component Value Date/Time   NA 138 02/25/2015 0837   NA 134* 03/14/2014 1354   K 4.5 02/25/2015 0837   K 4.1 03/14/2014 1354   CL 101 03/14/2014 1354   CO2 24 02/25/2015 0837   CO2 24 03/14/2014 1354   BUN 7.3 02/25/2015 0837   BUN 9 03/14/2014 1354   CREATININE 0.8 02/25/2015 0837   CREATININE 0.66 03/14/2014 1354      Component Value Date/Time   CALCIUM 8.8 02/25/2015 0837   CALCIUM 8.7 03/14/2014 1354   ALKPHOS 64 02/25/2015 0837   ALKPHOS 51 03/14/2014 1354   AST 18 02/25/2015 0837   AST 22 03/14/2014 1354   ALT 25 02/25/2015 0837   ALT 19 03/14/2014 1354   BILITOT 0.87 02/25/2015 0837   BILITOT 0.4 03/14/2014 1354       No results found for: LABCA2  No components found for: [id number]  No results for input(s): INR in the last 168 hours.  Urinalysis    Component Value Date/Time   BILIRUBINUR neg 05/03/2012 0954   PROTEINUR neg 05/03/2012 0954   UROBILINOGEN 0.2 05/03/2012 0954   NITRITE positive 05/03/2012 0954   LEUKOCYTESUR Trace 05/03/2012 0954      ELIGIBLE FOR AVAILABLE RESEARCH PROTOCOL: no  STUDIES: Mr Breast Bilateral W Wo Contrast  02/27/2015  CLINICAL DATA:  Recent diagnosis of right breast cancer. LABS:  Does not apply EXAM: BILATERAL BREAST MRI WITH AND WITHOUT CONTRAST TECHNIQUE: Multiplanar, multisequence MR images of both breasts were obtained prior to and following the intravenous administration of 20 ml of MultiHance. THREE-DIMENSIONAL MR IMAGE RENDERING ON INDEPENDENT WORKSTATION: Three-dimensional MR images were rendered by post-processing of the original MR data on an independent workstation. The three-dimensional MR images were interpreted, and findings are reported in the following complete MRI report for this study. Three dimensional images were evaluated at the independent DynaCad workstation COMPARISON:  Previous exam(s).  FINDINGS: Breast composition: b. Scattered fibroglandular tissue. Background parenchymal enhancement: Minimal Right breast: At the right breast upper inner quadrant, there are 3 areas of abnormal enhancement. There is a 1.5 x 1 cm spiculated enhancement anteriorly with biopsy clip correlating to the known biopsy proven cancer. On the same image along same plane is a second 0.5 x  0.6 cm area of enhancement correlating to the posterior group of calcifications seen on mammogram that was not biopsied at the middle depth. On the same image along the same plane is a third spiculated mass enhancement measuring 1.9 x 1.6 cm with associated biopsy clip correlating to biopsy-proven cancer. At the level the right nipple middle depth, minimal lateral is a 0.8 x 0.9 focal area of abnormal enhancement. Left breast: At the left breast lateral slightly upper anterior depth breast is a 1.3 x 1.6 cm abnormal enhancing spiculated mass. On the same image along the same plane is a second area of abnormal enhancement measuring 1.2 x 2.1 cm in the posterior depth. At the medial posterior upper left breast is a 0.9 x 0.7 cm area of abnormal enhancement. Lymph nodes: No abnormal appearing lymph nodes. Ancillary findings:  None. IMPRESSION: Suspicious findings. RECOMMENDATION: MRI guided biopsies of at least 2 of the masses in the left breast. Treatment plan for the right breast. BI-RADS CATEGORY  4: Suspicious. Electronically Signed   By: Abelardo Diesel M.D.   On: 02/27/2015 16:05   Mm Digital Diagnostic Unilat L  03/10/2015  CLINICAL DATA:  Status post MR guided core biopsy of 2 areas of enhancement in the upper-outer quadrant of the left breast. EXAM: DIAGNOSTIC LEFT MAMMOGRAM POST MRI BIOPSY x2 COMPARISON:  Previous exam(s). FINDINGS: Mammographic images were obtained following MRI guided biopsy of 2 areas of enhancement within the upper-outer quadrant of the left breast. Site 1: A cylinder-shaped clip is identified in the anterior  upper outer quadrant of the left breast. Site 2: A bow tie shaped clip is identified in the posterior upper outer quadrant of the left breast. The 2 clips are approximately 10.5 cm apart. IMPRESSION: Tissue marker clips are in expected locations following MR guided core biopsies. Final Assessment: Post Procedure Mammograms for Marker Placement Electronically Signed   By: Nolon Nations M.D.   On: 03/10/2015 09:56   Mr Aundra Millet Breast Bx Johnella Moloney Dev 1st Lesion Image Bx Spec Mr Guide  03/11/2015  ADDENDUM REPORT: 03/11/2015 15:41 ADDENDUM: Site 1: Pathology revealed ATYPICAL DUCTAL HYPERPLASIA, USUAL DUCTAL HYPERPLASIA, FIBROCYSTIC CHANGE of the upper outer quadrant of the Left breast, anterior, with excision recommended. Site 2: FIBROCYSTIC CHANGE, USUAL DUCTAL HYPERPLASIA of the upper outer quadrant of the Left breast, posterior. Findings are found to be concordant by Dr. Nolon Nations. Pathology results were discussed with the patient by telephone. The patient reported tenderness and is doing well after the biopsy. Post biopsy instructions and care were reviewed and questions were answered. The patient was encouraged to call [facility] for any additional concerns. The patient has a recent diagnosis of right breast cancer and should follow her outlined treatment plan. Pathology results reported by Terie Purser, RN on 03/11/2015. Electronically Signed   By: Nolon Nations M.D.   On: 03/11/2015 15:41  03/11/2015  CLINICAL DATA:  Recently diagnosed right breast cancer. Patient presents for MR guided core biopsy of 2 areas of enhancement in the left breast. EXAM: MRI GUIDED CORE NEEDLE BIOPSY OF THE LEFT BREAST x2 TECHNIQUE: Multiplanar, multisequence MR imaging of the left breast was performed both before and after administration of intravenous contrast. CONTRAST:  37m MULTIHANCE GADOBENATE DIMEGLUMINE 529 MG/ML IV SOLN COMPARISON:  Previous exams. FINDINGS: I met with the patient, and we  discussed the procedure of MRI guided biopsy, including risks, benefits, and alternatives. Specifically, we discussed the risks of infection, bleeding, tissue injury, clip migration,  and inadequate sampling. Informed, written consent was given. The usual time out protocol was performed immediately prior to the procedure. Site 1: Using sterile technique, 1% Lidocaine, MRI guidance, and a 9 gauge vacuum assisted device, biopsy was performed of anterior area of enhancement within the upper-outer quadrant of the left breast using a lateral approach. At the conclusion of the procedure, a cylinder-shaped tissue marker clip was deployed into the biopsy cavity. Site 2: Using the same technique, biopsy was performed of the posterior area of enhancement within the upper-outer quadrant of the left breast using a lateral approach. At the conclusion of the procedure a bow tie shaped tissue marker clip was deployed into the biopsy cavity. Follow-up 2-view mammogram was performed and dictated separately. IMPRESSION: MRI guided biopsy of 2 areas of enhancement in the upper-outer quadrant of the left breast. No apparent complications. Electronically Signed: By: Nolon Nations M.D. On: 03/10/2015 09:52   Mr Aundra Millet Breast Bx W Loc Dev Ea Add Lesion Image Bx Spec Mr Guide  03/11/2015  ADDENDUM REPORT: 03/11/2015 15:41 ADDENDUM: Site 1: Pathology revealed ATYPICAL DUCTAL HYPERPLASIA, USUAL DUCTAL HYPERPLASIA, FIBROCYSTIC CHANGE of the upper outer quadrant of the Left breast, anterior, with excision recommended. Site 2: FIBROCYSTIC CHANGE, USUAL DUCTAL HYPERPLASIA of the upper outer quadrant of the Left breast, posterior. Findings are found to be concordant by Dr. Nolon Nations. Pathology results were discussed with the patient by telephone. The patient reported tenderness and is doing well after the biopsy. Post biopsy instructions and care were reviewed and questions were answered. The patient was encouraged to call [facility] for any additional concerns. The patient has a recent diagnosis of right breast cancer and should follow her outlined treatment plan. Pathology results reported by Terie Purser, RN on 03/11/2015. Electronically Signed   By: Nolon Nations M.D.   On: 03/11/2015 15:41  03/11/2015  CLINICAL DATA:  Recently diagnosed right breast cancer. Patient presents for MR guided core biopsy of 2 areas of enhancement in the left breast. EXAM: MRI GUIDED CORE NEEDLE BIOPSY OF THE LEFT BREAST x2 TECHNIQUE: Multiplanar, multisequence MR imaging of the left breast was performed both before and after administration of intravenous contrast. CONTRAST:  47m MULTIHANCE GADOBENATE DIMEGLUMINE 529 MG/ML IV SOLN COMPARISON:  Previous exams. FINDINGS: I met with the patient, and we discussed the procedure of MRI guided biopsy, including risks, benefits, and alternatives. Specifically, we discussed the risks of infection, bleeding, tissue injury, clip migration, and inadequate sampling. Informed, written consent was given. The usual time out protocol was performed immediately prior to the procedure. Site 1: Using sterile technique, 1% Lidocaine, MRI guidance, and a 9 gauge vacuum assisted device, biopsy was performed of anterior area of enhancement within the upper-outer quadrant of the left breast using a lateral approach. At the conclusion of the procedure, a cylinder-shaped tissue marker clip was deployed into the biopsy cavity. Site 2: Using the same technique, biopsy was performed of the posterior area of enhancement within the upper-outer quadrant of the left breast using a lateral approach. At the conclusion of the procedure a bow tie shaped tissue marker clip was deployed into the biopsy cavity. Follow-up 2-view mammogram was performed and dictated separately. IMPRESSION: MRI guided biopsy of 2 areas of enhancement in the upper-outer quadrant of the left breast. No apparent complications. Electronically Signed: By:  ENolon NationsM.D. On: 03/10/2015 09:52    ASSESSMENT: 50[place] 02/17/2015 of 2 places in her  right breast, separated by 10 cm, both showing invasive ductal carcinoma, clinically T2 NX, stage II, estrogen receptor and progesterone receptor strongly positive, HER-2/neu nonamplified, with an MIB-1 of 25%  (1) genetics testing 03/06/2015 through theBreast/Ovarian Cancer Panel offered byGene[facility]x Laboratoriesfound no deleterious mutations in ATM, BARD1, BRCA1, BRCA2, BRIP1, CDH1, CHEK2, FANCC, MLH1, MSH2, MSH6, NBN, PALB2, PMS2, PTEN, RAD51C, RAD51D, TP53, and XRCC2. This panel also includes deletion/duplication analysis (without sequencing) for one gene, EPCAM.   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    (2) Oncotype DX score of 5 predicts an outside the breast risk of recurrence within 10 years of 5% if the patient's only systemic therapy is tamoxifen for 5 years. It also predicts no benefit from chemotherapy  (3) left breast biopsy 03/10/2015 shows an area of atypical ductal hyperplasia  (4) right mastectomy with sentinel lymph node and left lumpectomy with reduction mammoplasty planned for 04/06/2015  (5) adjuvant radiation to be considered as appropriate  (6) tamoxifen started neoadjuvantly on 02/25/2015  PLAN: Skylin is tolerating the tamoxifen well and the plan is to continue that for a total of 5 years.  Brooke [name] understands until we have the final pathology will not be sure whether or not Brooke [name] will need radiation. At this point Brooke [name] would be willing to undergo radiation if we recommended.  We discussed the Oncotype result in detail. Brooke [name] was able to understand the chart which shows that women like her get no added benefit from chemotherapy so long as they take tamoxifen for 5 years. Brooke [name] is very comfortable with that plan.  Accordingly Brooke [name] will see me again the third week in April.  By that time we should have the definitive surgery pathology results and should be able to make a decision regarding whether or not Brooke [name] will need radiation.   Brooke [name] knows to call for any problems that may develop before her next visit here.  Brooke Cruel, MD   03/27/2015 11:21 AM Medical Oncology and Hematology [facility] [address] [place], [place] [address] Tel. [phone]    Fax. [phone]

## 2015-03-30 ENCOUNTER — Encounter (HOSPITAL_COMMUNITY): Payer: Self-pay

## 2015-03-30 ENCOUNTER — Encounter (HOSPITAL_COMMUNITY)
Admission: RE | Admit: 2015-03-30 | Discharge: 2015-03-30 | Disposition: A | Discharge status: Home / Self Care | Payer: BC Managed Care – PPO | Source: Ambulatory Visit | Attending: Surgery | Admitting: Surgery

## 2015-03-30 DIAGNOSIS — C50911 Malignant neoplasm of unspecified site of right female breast: Secondary | ICD-10-CM | POA: Insufficient documentation

## 2015-03-30 DIAGNOSIS — Z01812 Encounter for preprocedural laboratory examination: Secondary | ICD-10-CM | POA: Insufficient documentation

## 2015-03-30 LAB — BASIC METABOLIC PANEL
ANION GAP: 10 (ref 5–15)
BUN: 8 mg/dL (ref 6–20)
CALCIUM: 9 mg/dL (ref 8.9–10.3)
CHLORIDE: 111 mmol/L (ref 101–111)
CO2: 21 mmol/L — AB (ref 22–32)
Creatinine, Ser: 0.72 mg/dL (ref 0.44–1.00)
GFR calc non Af Amer: >60 mL/min (ref >60)
Glucose, Bld: 103 mg/dL — ABNORMAL HIGH (ref 65–99)
Potassium: 4.2 mmol/L (ref 3.5–5.1)
Sodium: 142 mmol/L (ref 135–145)

## 2015-03-30 LAB — CBC
HCT: 36.4 % (ref 36.0–46.0)
HEMOGLOBIN: 11.6 g/dL — AB (ref 12.0–15.0)
MCH: 30.4 pg (ref 26.0–34.0)
MCHC: 31.9 g/dL (ref 30.0–36.0)
MCV: 95.3 fL (ref 78.0–100.0)
Platelets: 270 10*3/uL (ref 150–400)
RBC: 3.82 MIL/uL — AB (ref 3.87–5.11)
RDW: 13.3 % (ref 11.5–15.5)
WBC: 4.8 10*3/uL (ref 4.0–10.5)

## 2015-03-30 LAB — HCG, SERUM, QUALITATIVE: PREG SERUM: NEGATIVE

## 2015-03-30 NOTE — Progress Notes (Signed)
PCP is Dr Kendall Flack Denies ever seeing a cardiologist.  Denies ever having a card cath, stress test, or echo.

## 2015-04-01 NOTE — H&P (Signed)
Subjective:    Patient ID: Brooke [name] is a 50 y.o. female.  HPI  Patient of Drs. Lucia Gaskins, Magrinat referred for consultation for breast reconstruction. Presented following screening MMG with right breast mass and calcifications . Diagnostic MMG revealed a spiculated mass with pleomorphic calcifications measuring 1.5 cm in the UIQ of the right breast. Approximately 5.5 cm posterior to this mass is a more subtle area of density with pleomorphic calcifications measuring 1.0 cm. US of the 12:30 o'clock position of the right breast, 2 cm from the nipple, showed an irregular mass measuring 1.9 x 1.6 x 2.2 cm. US of the right axilla revealed normal appearing lymph nodes with preserved fatty hila. Biopsy of the 12:30 o'clock mass on revealed IDC with DCIS, ER / PR +, HER2 -. Biopsy of the UIQ of the right breast revealed IDC with DCIS and calcifications ER/PR +, HER2 -. The two sites that were biopsied was approximately 10 cm apart.  MRI demonstrated within right breast upper inner quadrant, 3 areas of abnormal enhancement. There is a 1.5 x 1 cm spiculated enhancement anteriorly with biopsy clip correlating to the known biopsy proven cancer. On the same image along same plane is a second 0.5 x 0.6 cm area of enhancement correlating to the posterior group of calcifications seen on mammogram that was not biopsied at the middle depth. On the same image along the same plane is a third spiculated mass enhancement measuring 1.9 x 1.6 cm with associated biopsy clip correlating to biopsy-proven cancer.  The LEFT breast demonstrated a 1.3 x 1.6 cm abnormal enhancing spiculated mass. A second area of abnormal enhancement measuring 1.2 x 2.1 cm in the posterior depth. At the medial posterior upper left breast is a 0.9 x 0.7 cm area of abnormal enhancement. Biopsy of left side with ADH UOQ on left, 0.1 cm focus. No malignancy either biopsy site on left and plan for RIGHT mastectomy alone, left lumpectomy.    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 Oncotype low risk.  Current DD. Weight 328 lb at time of bypass, lowest wt 165 lb, reports fluctuates within 20 lb of current wt. Works as [employer].   Review of Systems  Constitutional: Positive for fatigue.  Eyes: Positive for visual disturbance.  Psychiatric/Behavioral: Positive for dysphoric mood.  Remainder 12 point review negative.     Objective:   Physical Exam  Cardiovascular: Normal rate. Normal heart sounds Pulmonary/Chest: Effort normal. Clear to auscultation Abdominal: Soft.  No hernias, midline scar supraumbilical to xiphoid    grade 2/3 ptosis bilateral  SN to nipple R 34 L 34 cm BW R 20 L 20 cm Nipple to IMF R 16 L 17 cm Assessment:     R breast cancer Hx gastric bypass Genetics pending Left breast ADH   Plan:      Reviewed I do not recommend NSM given breasts ptosis and size. Reviewed immediate vs delayed reconstruction, autologous vs implant based. Reviewed duration surgery, hospital stay, drains, scars, recovery from each. Reviewed TRAM vs DIEP, if bilateral recommend consultation with DIEP surgeon. Reviewed abdominal based risks bulge, hernia, wound healing problems. Her scar is supraumbilical midline but cannot assure her she would be DIEP candidate, would likely need imaging.  Reviewed implant specific risks including rupture, contracture, infection requiring exchange or removal. Reviewed process of expansion, placement under muscle, future surgery to place implant timing of which dependent on adjuvant treatments. Reviewed with silicone may need to consider MRI surveillance of  implants.   Portfolio pictures reviewed, examples of implants and expanders reviewed. Plan placement expander possible acellular dermis over right breast. Discussed left breast lumpectomy will occur at time right mastectomy, however will defer any symmetry procedures such as left reduction until a later time.   Irene Limbo, MD [facility] Plastic & Reconstructive Surgery [phone]

## 2015-04-03 ENCOUNTER — Ambulatory Visit: Payer: Self-pay | Admitting: Surgery

## 2015-04-03 ENCOUNTER — Ambulatory Visit
Admission: RE | Admit: 2015-04-03 | Discharge: 2015-04-03 | Disposition: A | Discharge status: Home / Self Care | Payer: BC Managed Care – PPO | Source: Ambulatory Visit | Attending: Surgery | Admitting: Surgery

## 2015-04-03 DIAGNOSIS — C50919 Malignant neoplasm of unspecified site of unspecified female breast: Secondary | ICD-10-CM

## 2015-04-03 DIAGNOSIS — N6092 Unspecified benign mammary dysplasia of left breast: Secondary | ICD-10-CM

## 2015-04-05 MED ORDER — CEFAZOLIN SODIUM-DEXTROSE 2-4 GM/100ML-% IV SOLN
2.0000 g | INTRAVENOUS | Status: AC
  Administered 2015-04-06: 2 g via INTRAVENOUS
  Filled 2015-04-05: qty 100

## 2015-04-05 MED ORDER — CHLORHEXIDINE GLUCONATE 4 % EX LIQD
1.0000 | Freq: Once | CUTANEOUS | Status: DC
Start: 2015-04-05 — End: 2015-04-06

## 2015-04-05 MED ORDER — HEPARIN SODIUM (PORCINE) 5000 UNIT/ML IJ SOLN
5000.0000 [IU] | Freq: Once | INTRAMUSCULAR | Status: AC
  Administered 2015-04-06: 5000 [IU] via SUBCUTANEOUS
  Filled 2015-04-05: qty 1

## 2015-04-05 MED ORDER — CHLORHEXIDINE GLUCONATE 4 % EX LIQD: 1.0000 "application " | Freq: Once | CUTANEOUS | Status: DC

## 2015-04-05 NOTE — H&P (Signed)
Brooke [name]  Location: [facility] Surgery Patient #: [id number] DOB: 05-Nov-1965 Undefined / Language: Cleophus Molt / Race: Black or African American Female   History of Present Illness   The patient is a 50 year old female who presents with breast cancer.   Her PCP is Dr. Vassie [name].  She is at the [facility]. Her oncologist are Drs. Brooke [name] and Brooke [name].  She comes with her husband, Brooke [name].   Brooke [name] had not had a mammogram in several years. The last mammogram on record is 07/23/2009. She had a mammogram on 02/12/2015 which showst a spiculated mass in the upper inner right breast and calcifications in the upper inner right breast. Though in the same "quadrant" they are about 10 cm apart. Note, there is a 3rd area of microca++, sort of between the two biopsied areas, that has not been biopsied. Since it looks like she is heading to a mastectomy, I does not appear that these areas need biopsy at this time. She underwent 2 biopsies of her right breast:  02/16/2015 - ([id number]) which showed IDC, ER - 95%, PR -45%, Ki67 - 25%, Her2 Neu - neg and 02/17/2015 ([id number]) - which showed IDC, ER - 95%, PR -95%, Ki67 - 25%, Her2 Neu Both of the biopsies are in the UIQ of the right breast, but they are about 10 cm apart. She has no family history of breast cancer. She is still on her periods. Her last period ended yesterday. She is not on hormonal therapy.  I discussed the options for breast cancer treatment with the patient. She is at the Breast multidisciplinary clinic, which includes medical oncology and radiation oncology. I discussed the surgical options of lumpectomy vs. mastectomy. If mastectomy, there is the possibility of reconstruction. I discussed the options of lymph node biopsy. The treatment plan depends on the pathologic staging of the tumor and the patient's personal wishes. The risks of surgery include,  but are not limited to, bleeding, infection, the need for further surgery, and nerve injury. The patient has been given literature on the treatment of breast cancer and I gave her a copy of her path report. Unfortunately, the disease in her right breast spans almost 10 cm. And there is another abnormal area we have not biopsied. There is a concern about this being a continuous area of breast cancer, which means she would need rad tx, regardless of her local surgical care. Brooke [name], at the [facility], had commented that she was very anxious and would not allow another biopsy. Even though depressed about the need for a mastectomy, I found her understanding.  Plan: 1) MRI (scheduled for 02/27/2015) to see extent of disease and the possibililty of needing post op radiation, 2) Plastic surgery for reconstruction (Brooke [name] - 3/1), 3) Oncotype, 4) Genetics, 5) Tamoxifen while waiting on workup, 6) right mastectomy with probable reconstruction, with right axillary sentinel lymph node  Past Medical History: 1. Lap chole - 2000 in [facility] 2. Gastric bypass - 2005 - Dr. Patsey Berthold  Preop weight about 328. Weight now about 240.  Social History: She comes with her husband, Brooke [name]. 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 She has 2 children. A 43 year old son, Brooke [name], who is a Engineer, petroleum. And a 71 year old daughter, Brooke [name], who is a Ship broker.   Other Problems Brooke Leventhal, RN, BSN; 02/25/2015 4:34 PM) Breast Cancer Depression  Past Surgical History Brooke Leventhal, RN, BSN; 02/25/2015 4:34 PM) Gallbladder Surgery - Laparoscopic Gastric Bypass  Diagnostic Studies History (Brooke  Dollard, RN, BSN; 02/25/2015 4:34 PM) Colonoscopy never Mammogram within last year Pap Smear 1-5 years ago  Medication History Brooke Slipper, RN; 02/25/2015 7:56 AM) No Current Medications Medications Reconciled  Social History Brooke Leventhal, RN, BSN;  02/25/2015 4:34 PM) Alcohol use Moderate alcohol use. Caffeine use Carbonated beverages, Coffee, Tea. No drug use Tobacco use Never smoker.  Family History (Brooke Leventhal, RN, BSN; 02/25/2015 4:34 PM) Diabetes Mellitus Mother. Hypertension Mother.  Pregnancy / Birth History Brooke Leventhal, RN, BSN; 02/25/2015 4:34 PM) Age at menarche 45 years. Age of menopause 65-50 Contraceptive History Depo-provera. Gravida 3 Irregular periods Maternal age 9-25 Para 2    Review of Systems Brooke [name], BSN; 02/25/2015 4:34 PM) General Present- Fatigue and Night Sweats. Not Present- Appetite Loss, Chills, Fever, Weight Gain and Weight Loss. HEENT Not Present- Earache, Hearing Loss, Hoarseness, Nose Bleed, Oral Ulcers, Ringing in the Ears, Seasonal Allergies, Sinus Pain, Sore Throat, Visual Disturbances, Wears glasses/contact lenses and Yellow Eyes. Respiratory Not Present- Bloody sputum, Chronic Cough, Difficulty Breathing, Snoring and Wheezing. Breast Not Present- Breast Mass, Breast Pain, Nipple Discharge and Skin Changes. Cardiovascular Not Present- Chest Pain, Difficulty Breathing Lying Down, Leg Cramps, Palpitations, Rapid Heart Rate, Shortness of Breath and Swelling of Extremities. Gastrointestinal Not Present- Abdominal Pain, Bloating, Bloody Stool, Change in Bowel Habits, Chronic diarrhea, Constipation, Difficulty Swallowing, Excessive gas, Gets full quickly at meals, Hemorrhoids, Indigestion, Nausea, Rectal Pain and Vomiting. Female Genitourinary Not Present- Frequency, Nocturia, Painful Urination, Pelvic Pain and Urgency. Musculoskeletal Not Present- Back Pain, Joint Pain, Joint Stiffness, Muscle Pain, Muscle Weakness and Swelling of Extremities. Neurological Not Present- Decreased Memory, Fainting, Headaches, Numbness, Seizures, Tingling, Tremor, Trouble walking and Weakness. Psychiatric Not Present- Anxiety, Bipolar, Change in Sleep Pattern, Depression, Fearful and  Frequent crying. Endocrine Present- Hot flashes. Not Present- Cold Intolerance, Excessive Hunger, Hair Changes, Heat Intolerance and New Diabetes. Hematology Not Present- Easy Bruising, Excessive bleeding, Gland problems, HIV and Persistent Infections.   Physical Exam  General: Obese AA F alert and generally healthy appearing. HEENT: Normal. Pupils equal.  Neck: Supple. No mass. No thyroid mass.  Lymph Nodes: No supraclavicular, cervical or axillary nodes.  Lungs: Clear to auscultation and symmetric breath sounds. Heart: RRR. No murmur or rub.  Breast: Right - two punctate scars from the biopsies of the right breast. The are at the 1 o'clock position: one is near the areola, the other is near the edge of the breast tissue. Left - no mass or nodule  Abdomen: Soft. No mass. No tenderness. No hernia. Normal bowel sounds. Upper midline scar from prior gastric bypass  Extremities: Good strength and ROM in upper and lower extremities.  Neurologic: Grossly intact to motor and sensory function. Psychiatric: Has normal mood and affect. Behavior is normal.   Assessment & Plan  1.  BREAST CANCER, STAGE 2, RIGHT (C50.911)  Story: 2 biopsies of the UIQ of her right breast (these biopsies are about 10 cm apart):   02/16/2015 - ([id number]) which showed IDC, ER - 95%, PR -45%, Ki67 - 25%, Her2 Neu - neg and  02/17/2015 ([id number]) - which showed IDC, ER - 95%, PR -95%, Ki67 - 25%, Her2 Neu   Treating oncologist - Brooke [name] and [place]  Impression: Plan:   1) MRI ,    MRI on 02/27/2015 shows:  Right breast there is a 3rd area - will not need biopsy since we plan to go ahead with mastectomy on the right  Left breast - there are 3 areas of concern  on the left breast - 1.6 cm upper lateral anterior, 2. 1 cm area upper lateral posterior, and 0.9 cm medial posterior left breast   I spoke to patient - she'll need at least 2 biopsies of the left breast - if both malignant,  she'll need bilateral mastectomies.    Biopsy of left breast x 2 - 03/10/2015 ([id number]) - 1. ADH UOQ anteriorly (this will need excision), 2. Fibrocystic disease UOQ posteriorly (nothing further to do)  I talked to B. Owens Shark. She did not see the third area on her MRI. So we will do nothing further now to the left, except the left breast lumpectomy  But would plan mammogram and MRI in one year    2) Plastic surgery for reconstruction - she saw Dr. Iran Planas   3) Oncotype     Oncotype was 5 (low risk), she should not need chemotx   4) Genetics,    Genetics showed a VUS, but no other abnormality   5) Tamoxifen while waiting on workup,   6) Will schedule surgery - Right mastectomy, right axillary SLNBx, left breast lumpectomy (seed loc) and right breast reconstruction (Brooke [name])  2.  HISTORY OF GASTRIC BYPASS (X80.638)  Story: 2005 - Dr. Patsey Berthold - [facility]    Alphonsa Overall, MD, [facility] Surgery Pager: [phone] Office phone:  [phone]

## 2015-04-06 ENCOUNTER — Ambulatory Visit
Admission: RE | Admit: 2015-04-06 | Discharge: 2015-04-06 | Disposition: A | Discharge status: Home / Self Care | Payer: BC Managed Care – PPO | Source: Ambulatory Visit | Attending: Surgery | Admitting: Surgery

## 2015-04-06 ENCOUNTER — Encounter (HOSPITAL_COMMUNITY): Payer: Self-pay | Admitting: *Deleted

## 2015-04-06 ENCOUNTER — Ambulatory Visit (HOSPITAL_COMMUNITY)
Admission: RE | Admit: 2015-04-06 | Discharge: 2015-04-06 | Disposition: A | Discharge status: Home / Self Care | Payer: BC Managed Care – PPO | Source: Ambulatory Visit | Attending: Surgery | Admitting: Surgery

## 2015-04-06 ENCOUNTER — Ambulatory Visit (HOSPITAL_COMMUNITY): Payer: BC Managed Care – PPO | Admitting: Anesthesiology

## 2015-04-06 ENCOUNTER — Encounter (HOSPITAL_COMMUNITY)
Admission: RE | Disposition: A | Discharge status: Home / Self Care | Payer: Self-pay | Source: Ambulatory Visit | Attending: Surgery

## 2015-04-06 ENCOUNTER — Observation Stay (HOSPITAL_COMMUNITY)
Admission: RE | Admit: 2015-04-06 | Discharge: 2015-04-07 | Disposition: A | Discharge status: Home / Self Care | Payer: BC Managed Care – PPO | Source: Ambulatory Visit | Attending: Surgery | Admitting: Surgery

## 2015-04-06 DIAGNOSIS — Z9884 Bariatric surgery status: Secondary | ICD-10-CM | POA: Diagnosis not present

## 2015-04-06 DIAGNOSIS — C50911 Malignant neoplasm of unspecified site of right female breast: Secondary | ICD-10-CM | POA: Insufficient documentation

## 2015-04-06 DIAGNOSIS — C50919 Malignant neoplasm of unspecified site of unspecified female breast: Secondary | ICD-10-CM

## 2015-04-06 DIAGNOSIS — D0511 Intraductal carcinoma in situ of right breast: Secondary | ICD-10-CM | POA: Diagnosis not present

## 2015-04-06 DIAGNOSIS — Z170 Estrogen receptor positive status [ER+]: Secondary | ICD-10-CM | POA: Diagnosis not present

## 2015-04-06 DIAGNOSIS — N6092 Unspecified benign mammary dysplasia of left breast: Secondary | ICD-10-CM

## 2015-04-06 HISTORY — PX: MASTECTOMY W/ SENTINEL NODE BIOPSY: SHX2001

## 2015-04-06 HISTORY — PX: BREAST LUMPECTOMY WITH RADIOACTIVE SEED LOCALIZATION: SHX6424

## 2015-04-06 HISTORY — PX: BREAST RECONSTRUCTION WITH PLACEMENT OF TISSUE EXPANDER AND FLEX HD (ACELLULAR HYDRATED DERMIS): SHX6295

## 2015-04-06 SURGERY — BREAST LUMPECTOMY WITH RADIOACTIVE SEED LOCALIZATION
Anesthesia: Regional | Site: Breast | Laterality: Right

## 2015-04-06 MED ORDER — ONDANSETRON HCL 4 MG/2ML IJ SOLN
INTRAMUSCULAR | Status: DC | PRN
  Administered 2015-04-06: 4 mg via INTRAVENOUS

## 2015-04-06 MED ORDER — TECHNETIUM TC 99M SULFUR COLLOID FILTERED
1.0000 | Freq: Once | INTRAVENOUS | Status: AC | PRN
  Administered 2015-04-06: 1 via INTRADERMAL

## 2015-04-06 MED ORDER — METHOCARBAMOL 500 MG PO TABS
500.0000 mg | ORAL_TABLET | Freq: Three times a day (TID) | ORAL | Status: DC | PRN
  Administered 2015-04-06 – 2015-04-07 (×2): 500 mg via ORAL
  Filled 2015-04-06 (×2): qty 1

## 2015-04-06 MED ORDER — SUGAMMADEX SODIUM 500 MG/5ML IV SOLN
INTRAVENOUS | Status: AC
  Filled 2015-04-06: qty 5

## 2015-04-06 MED ORDER — LIDOCAINE HCL (CARDIAC) 20 MG/ML IV SOLN
INTRAVENOUS | Status: AC
  Filled 2015-04-06: qty 5

## 2015-04-06 MED ORDER — TAMOXIFEN CITRATE 10 MG PO TABS
20.0000 mg | ORAL_TABLET | Freq: Every day | ORAL | Status: DC
  Administered 2015-04-07: 20 mg via ORAL
  Filled 2015-04-06 (×2): qty 2

## 2015-04-06 MED ORDER — SODIUM CHLORIDE 0.9 % IJ SOLN
INTRAMUSCULAR | Status: AC
  Filled 2015-04-06: qty 10

## 2015-04-06 MED ORDER — OXYCODONE HCL 5 MG PO TABS: 5.0000 mg | ORAL_TABLET | Freq: Once | ORAL | Status: DC | PRN

## 2015-04-06 MED ORDER — PHENYLEPHRINE HCL 10 MG/ML IJ SOLN
INTRAMUSCULAR | Status: DC | PRN
  Administered 2015-04-06 (×2): 40 ug via INTRAVENOUS
  Administered 2015-04-06 (×2): 80 ug via INTRAVENOUS

## 2015-04-06 MED ORDER — VECURONIUM BROMIDE 10 MG IV SOLR
INTRAVENOUS | Status: AC
  Filled 2015-04-06: qty 10

## 2015-04-06 MED ORDER — HYDROMORPHONE HCL 1 MG/ML IJ SOLN
INTRAMUSCULAR | Status: AC
  Filled 2015-04-06: qty 1

## 2015-04-06 MED ORDER — ROCURONIUM BROMIDE 100 MG/10ML IV SOLN
INTRAVENOUS | Status: DC | PRN
  Administered 2015-04-06: 50 mg via INTRAVENOUS

## 2015-04-06 MED ORDER — FENTANYL CITRATE (PF) 250 MCG/5ML IJ SOLN
INTRAMUSCULAR | Status: DC | PRN
Start: 2015-04-06 — End: 2015-04-06
  Administered 2015-04-06 (×2): 50 ug via INTRAVENOUS
  Administered 2015-04-06 (×3): 25 ug via INTRAVENOUS
  Administered 2015-04-06: 50 ug via INTRAVENOUS
  Administered 2015-04-06 (×2): 100 ug via INTRAVENOUS

## 2015-04-06 MED ORDER — 0.9 % SODIUM CHLORIDE (POUR BTL) OPTIME
TOPICAL | Status: DC | PRN
  Administered 2015-04-06 (×2): 1000 mL

## 2015-04-06 MED ORDER — ROCURONIUM BROMIDE 50 MG/5ML IV SOLN
INTRAVENOUS | Status: AC
  Filled 2015-04-06: qty 1

## 2015-04-06 MED ORDER — OXYCODONE HCL 5 MG/5ML PO SOLN: 5.0000 mg | Freq: Once | ORAL | Status: DC | PRN

## 2015-04-06 MED ORDER — ONDANSETRON 4 MG PO TBDP: 4.0000 mg | ORAL_TABLET | Freq: Four times a day (QID) | ORAL | Status: DC | PRN

## 2015-04-06 MED ORDER — LACTATED RINGERS IV SOLN
INTRAVENOUS | Status: DC
  Administered 2015-04-06 (×2): via INTRAVENOUS

## 2015-04-06 MED ORDER — SUGAMMADEX SODIUM 500 MG/5ML IV SOLN
INTRAVENOUS | Status: DC | PRN
  Administered 2015-04-06: 223.8 mg via INTRAVENOUS

## 2015-04-06 MED ORDER — VECURONIUM BROMIDE 10 MG IV SOLR
INTRAVENOUS | Status: DC | PRN
  Administered 2015-04-06 (×3): 1 mg via INTRAVENOUS

## 2015-04-06 MED ORDER — SODIUM CHLORIDE 0.9 % IV SOLN
INTRAVENOUS | Status: DC | PRN
  Administered 2015-04-06: 13:00:00

## 2015-04-06 MED ORDER — LIDOCAINE HCL (CARDIAC) 20 MG/ML IV SOLN
INTRAVENOUS | Status: DC | PRN
  Administered 2015-04-06: 60 mg via INTRAVENOUS

## 2015-04-06 MED ORDER — PROPOFOL 10 MG/ML IV BOLUS
INTRAVENOUS | Status: DC | PRN
  Administered 2015-04-06: 50 mg via INTRAVENOUS
  Administered 2015-04-06: 200 mg via INTRAVENOUS
  Administered 2015-04-06: 50 mg via INTRAVENOUS

## 2015-04-06 MED ORDER — SUCCINYLCHOLINE CHLORIDE 20 MG/ML IJ SOLN
INTRAMUSCULAR | Status: DC | PRN
  Administered 2015-04-06: 120 mg via INTRAVENOUS

## 2015-04-06 MED ORDER — ONDANSETRON HCL 4 MG/2ML IJ SOLN: 4.0000 mg | Freq: Four times a day (QID) | INTRAMUSCULAR | Status: DC | PRN

## 2015-04-06 MED ORDER — MORPHINE SULFATE (PF) 2 MG/ML IV SOLN
1.0000 mg | INTRAVENOUS | Status: DC | PRN
  Administered 2015-04-06 – 2015-04-07 (×3): 2 mg via INTRAVENOUS
  Filled 2015-04-06 (×3): qty 1

## 2015-04-06 MED ORDER — KCL IN DEXTROSE-NACL 20-5-0.45 MEQ/L-%-% IV SOLN
INTRAVENOUS | Status: DC
  Administered 2015-04-06: 20:00:00 via INTRAVENOUS
  Filled 2015-04-06 (×2): qty 1000

## 2015-04-06 MED ORDER — SODIUM CHLORIDE 0.9 % IJ SOLN
INTRAVENOUS | Status: DC | PRN
  Administered 2015-04-06: 3 mL via INTRAMUSCULAR

## 2015-04-06 MED ORDER — SODIUM CHLORIDE 0.9 % IV SOLN
INTRAVENOUS | Status: DC
  Filled 2015-04-06: qty 1

## 2015-04-06 MED ORDER — HYDROMORPHONE HCL 1 MG/ML IJ SOLN
0.2500 mg | INTRAMUSCULAR | Status: DC | PRN
  Administered 2015-04-06 (×4): 0.5 mg via INTRAVENOUS

## 2015-04-06 MED ORDER — FENTANYL CITRATE (PF) 250 MCG/5ML IJ SOLN
INTRAMUSCULAR | Status: AC
  Filled 2015-04-06: qty 5

## 2015-04-06 MED ORDER — ONDANSETRON HCL 4 MG/2ML IJ SOLN
INTRAMUSCULAR | Status: AC
  Filled 2015-04-06: qty 2

## 2015-04-06 MED ORDER — METHYLENE BLUE 0.5 % INJ SOLN
INTRAVENOUS | Status: AC
  Filled 2015-04-06: qty 10

## 2015-04-06 MED ORDER — PROPOFOL 10 MG/ML IV BOLUS
INTRAVENOUS | Status: AC
  Filled 2015-04-06: qty 20

## 2015-04-06 MED ORDER — MIDAZOLAM HCL 2 MG/2ML IJ SOLN
INTRAMUSCULAR | Status: AC
  Administered 2015-04-06: 2 mg
  Filled 2015-04-06: qty 2

## 2015-04-06 MED ORDER — IBUPROFEN 600 MG PO TABS: 600.0000 mg | ORAL_TABLET | Freq: Four times a day (QID) | ORAL | Status: DC | PRN

## 2015-04-06 MED ORDER — PHENYLEPHRINE 40 MCG/ML (10ML) SYRINGE FOR IV PUSH (FOR BLOOD PRESSURE SUPPORT)
PREFILLED_SYRINGE | INTRAVENOUS | Status: AC
  Filled 2015-04-06: qty 10

## 2015-04-06 MED ORDER — CEFAZOLIN SODIUM 1-5 GM-% IV SOLN
1.0000 g | Freq: Three times a day (TID) | INTRAVENOUS | Status: DC
  Administered 2015-04-06 – 2015-04-07 (×2): 1 g via INTRAVENOUS
  Filled 2015-04-06 (×7): qty 50

## 2015-04-06 MED ORDER — HEPARIN SODIUM (PORCINE) 5000 UNIT/ML IJ SOLN
5000.0000 [IU] | Freq: Three times a day (TID) | INTRAMUSCULAR | Status: DC
  Administered 2015-04-06 – 2015-04-07 (×2): 5000 [IU] via SUBCUTANEOUS
  Filled 2015-04-06 (×2): qty 1

## 2015-04-06 MED ORDER — DEXAMETHASONE SODIUM PHOSPHATE 4 MG/ML IJ SOLN
INTRAMUSCULAR | Status: DC | PRN
  Administered 2015-04-06: 4 mg via INTRAVENOUS

## 2015-04-06 MED ORDER — STERILE WATER FOR INJECTION IJ SOLN
INTRAMUSCULAR | Status: AC
  Filled 2015-04-06: qty 10

## 2015-04-06 MED ORDER — OXYCODONE HCL 5 MG PO TABS
5.0000 mg | ORAL_TABLET | ORAL | Status: DC | PRN
  Administered 2015-04-07 (×2): 10 mg via ORAL
  Filled 2015-04-06 (×2): qty 2

## 2015-04-06 MED ORDER — FENTANYL CITRATE (PF) 100 MCG/2ML IJ SOLN
INTRAMUSCULAR | Status: AC
  Administered 2015-04-06: 100 ug
  Filled 2015-04-06: qty 2

## 2015-04-06 SURGICAL SUPPLY — 97 items
APPLIER CLIP 9.375 MED OPEN (MISCELLANEOUS) ×5
APR CLP MED 9.3 20 MLT OPN (MISCELLANEOUS) ×3
ATCH SMKEVC FLXB CAUT HNDSWH (FILTER) ×3 IMPLANT
BAG DECANTER FOR FLEXI CONT (MISCELLANEOUS) ×5 IMPLANT
BINDER BREAST LRG (GAUZE/BANDAGES/DRESSINGS) IMPLANT
BINDER BREAST XLRG (GAUZE/BANDAGES/DRESSINGS) IMPLANT
BINDER BREAST XXLRG (GAUZE/BANDAGES/DRESSINGS) ×2 IMPLANT
BLADE SURG 15 STRL LF DISP TIS (BLADE) ×3 IMPLANT
BLADE SURG 15 STRL SS (BLADE)
CANISTER SUCTION 2500CC (MISCELLANEOUS) ×13 IMPLANT
CHLORAPREP W/TINT 26ML (MISCELLANEOUS) ×10 IMPLANT
CLIP APPLIE 9.375 MED OPEN (MISCELLANEOUS) ×3 IMPLANT
CLOSURE STERI-STRIP 1/2X4 (GAUZE/BANDAGES/DRESSINGS)
CLSR STERI-STRIP ANTIMIC 1/2X4 (GAUZE/BANDAGES/DRESSINGS) ×3 IMPLANT
CONT SPEC 4OZ CLIKSEAL STRL BL (MISCELLANEOUS) ×11 IMPLANT
COVER PROBE W GEL 5X96 (DRAPES) ×5 IMPLANT
COVER SURGICAL LIGHT HANDLE (MISCELLANEOUS) ×8 IMPLANT
DEVICE DISSECT PLASMABLAD 3.0S (MISCELLANEOUS) ×3 IMPLANT
DEVICE DUBIN SPECIMEN MAMMOGRA (MISCELLANEOUS) ×5 IMPLANT
DRAIN CHANNEL 15F RND FF W/TCR (WOUND CARE) IMPLANT
DRAIN CHANNEL 19F RND (DRAIN) ×5 IMPLANT
DRAPE CHEST BREAST 15X10 FENES (DRAPES) ×3 IMPLANT
DRAPE INCISE IOBAN 66X45 STRL (DRAPES) IMPLANT
DRAPE ORTHO SPLIT 77X108 STRL (DRAPES) ×10
DRAPE PROXIMA HALF (DRAPES) ×9 IMPLANT
DRAPE SURG ORHT 6 SPLT 77X108 (DRAPES) ×6 IMPLANT
DRAPE UTILITY XL STRL (DRAPES) ×3 IMPLANT
DRAPE WARM FLUID 44X44 (DRAPE) ×5 IMPLANT
DRSG PAD ABDOMINAL 8X10 ST (GAUZE/BANDAGES/DRESSINGS) ×12 IMPLANT
DRSG TEGADERM 2-3/8X2-3/4 SM (GAUZE/BANDAGES/DRESSINGS) ×5 IMPLANT
DRSG TEGADERM 4X4.75 (GAUZE/BANDAGES/DRESSINGS) ×12 IMPLANT
ELECT BLADE 4.0 EZ CLEAN MEGAD (MISCELLANEOUS) ×5
ELECT CAUTERY BLADE 6.4 (BLADE) ×8 IMPLANT
ELECT COATED BLADE 2.86 ST (ELECTRODE) ×5 IMPLANT
ELECT REM PT RETURN 9FT ADLT (ELECTROSURGICAL) ×10
ELECTRODE BLDE 4.0 EZ CLN MEGD (MISCELLANEOUS) ×3 IMPLANT
ELECTRODE REM PT RTRN 9FT ADLT (ELECTROSURGICAL) ×9 IMPLANT
EVACUATOR SILICONE 100CC (DRAIN) ×5 IMPLANT
EVACUATOR SMOKE ACCUVAC VALLEY (FILTER)
GAUZE SPONGE 4X4 12PLY STRL (GAUZE/BANDAGES/DRESSINGS) ×6 IMPLANT
GLOVE BIO SURGEON STRL SZ 6 (GLOVE) ×15 IMPLANT
GLOVE BIO SURGEON STRL SZ7 (GLOVE) ×2 IMPLANT
GLOVE BIOGEL PI IND STRL 8 (GLOVE) IMPLANT
GLOVE BIOGEL PI INDICATOR 8 (GLOVE) ×4
GLOVE SURG SIGNA 7.5 PF LTX (GLOVE) ×7 IMPLANT
GLOVE SURG SS PI 8.0 STRL IVOR (GLOVE) ×6 IMPLANT
GOWN STRL REUS W/ TWL LRG LVL3 (GOWN DISPOSABLE) ×9 IMPLANT
GOWN STRL REUS W/ TWL XL LVL3 (GOWN DISPOSABLE) ×3 IMPLANT
GOWN STRL REUS W/TWL LRG LVL3 (GOWN DISPOSABLE) ×15
GOWN STRL REUS W/TWL XL LVL3 (GOWN DISPOSABLE) ×15
GRAFT FLEX HD 4X16 THICK (Tissue Mesh) ×2 IMPLANT
ILLUMINATOR WAVEGUIDE N/F (MISCELLANEOUS) IMPLANT
KIT BASIN OR (CUSTOM PROCEDURE TRAY) ×8 IMPLANT
KIT MARKER MARGIN INK (KITS) ×5 IMPLANT
KIT ROOM TURNOVER OR (KITS) ×8 IMPLANT
LIGHT WAVEGUIDE WIDE FLAT (MISCELLANEOUS) IMPLANT
LIQUID BAND (GAUZE/BANDAGES/DRESSINGS) ×13 IMPLANT
MARKER SKIN DUAL TIP RULER LAB (MISCELLANEOUS) ×8 IMPLANT
NDL 18GX1X1/2 (RX/OR ONLY) (NEEDLE) IMPLANT
NDL HYPO 25GX1X1/2 BEV (NEEDLE) IMPLANT
NEEDLE 18GX1X1/2 (RX/OR ONLY) (NEEDLE) IMPLANT
NEEDLE HYPO 25GX1X1/2 BEV (NEEDLE) ×5 IMPLANT
NS IRRIG 1000ML POUR BTL (IV SOLUTION) ×15 IMPLANT
PACK GENERAL/GYN (CUSTOM PROCEDURE TRAY) ×8 IMPLANT
PACK SURGICAL SETUP 50X90 (CUSTOM PROCEDURE TRAY) ×3 IMPLANT
PAD ARMBOARD 7.5X6 YLW CONV (MISCELLANEOUS) ×8 IMPLANT
PENCIL BUTTON HOLSTER BLD 10FT (ELECTRODE) ×3 IMPLANT
PIN SAFETY STERILE (MISCELLANEOUS) ×5 IMPLANT
PLASMABLADE 3.0S (MISCELLANEOUS) ×5
SET ASEPTIC TRANSFER (MISCELLANEOUS) ×5 IMPLANT
SOLUTION BETADINE 4OZ (MISCELLANEOUS) ×5 IMPLANT
SPECIMEN JAR X LARGE (MISCELLANEOUS) ×5 IMPLANT
SPONGE LAP 18X18 X RAY DECT (DISPOSABLE) ×9 IMPLANT
STAPLER VISISTAT 35W (STAPLE) ×3 IMPLANT
SUT ETHILON 2 0 FS 18 (SUTURE) ×5 IMPLANT
SUT MNCRL 3 0 RB1 (SUTURE) IMPLANT
SUT MNCRL AB 4-0 PS2 18 (SUTURE) ×5 IMPLANT
SUT MON AB 5-0 PS2 18 (SUTURE) IMPLANT
SUT MONOCRYL 3 0 RB1 (SUTURE)
SUT SILK 2 0 FS (SUTURE) ×3 IMPLANT
SUT VIC AB 3-0 SH 18 (SUTURE) ×5 IMPLANT
SUT VIC AB 3-0 SH 27 (SUTURE) ×15
SUT VIC AB 3-0 SH 27X BRD (SUTURE) IMPLANT
SUT VIC AB 3-0 SH 8-18 (SUTURE) ×5 IMPLANT
SUT VIC AB 4-0 PS2 27 (SUTURE) IMPLANT
SUT VICRYL 3 0 (SUTURE) IMPLANT
SUT VICRYL 4-0 PS2 18IN ABS (SUTURE) ×2 IMPLANT
SYR BULB 3OZ (MISCELLANEOUS) ×3 IMPLANT
SYR BULB IRRIGATION 50ML (SYRINGE) ×5 IMPLANT
SYR CONTROL 10ML LL (SYRINGE) ×5 IMPLANT
TISSUE EXPANDER (Prosthesis & Implant Plastic) ×2 IMPLANT
TOWEL OR 17X24 6PK STRL BLUE (TOWEL DISPOSABLE) ×8 IMPLANT
TOWEL OR 17X26 10 PK STRL BLUE (TOWEL DISPOSABLE) ×8 IMPLANT
TRAY FOLEY CATH 16FRSI W/METER (SET/KITS/TRAYS/PACK) ×2 IMPLANT
TUBE CONNECTING 12'X1/4 (SUCTIONS) ×1
TUBE CONNECTING 12X1/4 (SUCTIONS) ×7 IMPLANT
YANKAUER SUCT BULB TIP NO VENT (SUCTIONS) ×2 IMPLANT

## 2015-04-06 NOTE — Interval H&P Note (Signed)
History and Physical Interval Note:  04/06/2015 8:05 AM  Brooke [name]  has presented today for surgery, with the diagnosis of RIGHT BREAST CANCER, LEFT BREAST ADH  The various methods of treatment have been discussed with the patient and family. After consideration of risks, benefits and other options for treatment, the patient has consented to  Procedure(s): LEFT BREAST LUMPECTOMY WITH RADIOACTIVE SEED LOCALIZATION (Left) RIGHT MASTECTOMY WITH SENTINEL LYMPH NODE BIOPSY (Right) RIGHT BREAST RECONSTRUCTION WITH PLACEMENT OF TISSUE EXPANDER, POSSIBLE ACELLULAR DERMIS (Right) as a surgical intervention .  The patient's history has been reviewed, patient examined, no change in status, stable for surgery.  I have reviewed the patient's chart and labs.  Questions were answered to the patient's satisfaction.     [name]

## 2015-04-06 NOTE — Anesthesia Procedure Notes (Addendum)
Procedure Name: Intubation Date/Time: 04/06/2015 11:27 AM Performed by: Myna Bright Pre-anesthesia Checklist: Patient identified, Emergency Drugs available, Suction available, Patient being monitored and Timeout performed Patient Re-evaluated:Patient Re-evaluated prior to inductionOxygen Delivery Method: Circle system utilized Preoxygenation: Pre-oxygenation with 100% oxygen Intubation Type: IV induction Ventilation: Mask ventilation without difficulty Laryngoscope Size: Miller and 2 Grade View: Grade I Tube type: Oral Tube size: 7.0 mm Number of attempts: 1 Airway Equipment and Method: Stylet Placement Confirmation: ETT inserted through vocal cords under direct vision,  positive ETCO2 and breath sounds checked- equal and bilateral   Anesthesia Regional Block:  Pectoralis block  Pre-Anesthetic Checklist: ,, timeout performed, Correct Patient, Correct Site, Correct Laterality, Correct Procedure, Correct Position, site marked, Risks and benefits discussed,  Surgical consent,  Pre-op evaluation,  At surgeon's request and post-op pain management  Laterality: Right  Prep: chloraprep       Needles:  Injection technique: Single-shot  Needle Type: Echogenic Needle     Needle Length: 9cm 9 cm Needle Gauge: 21 and 21 G    Additional Needles:  Procedures: ultrasound guided (picture in chart) Pectoralis block Narrative:  Start time: 04/06/2015 10:14 AM End time: 04/06/2015 10:24 AM Injection made incrementally with aspirations every 5 mL.  Performed by: Personally  Anesthesiologist: Geet Hosking  Additional Notes: Pt tolerated the procedure well.  30 mL of 0.5% bupivacaine was injected incrementally at the site.

## 2015-04-06 NOTE — Interval H&P Note (Signed)
History and Physical Interval Note:  04/06/2015 11:09 AM  Brooke [name]  has presented today for surgery, with the diagnosis of RIGHT BREAST CANCER, LEFT BREAST ADH  The various methods of treatment have been discussed with the patient and family.  Husband at bedside.  I checked for the seed.  After consideration of risks, benefits and other options for treatment, the patient has consented to  Procedure(s): LEFT BREAST LUMPECTOMY WITH RADIOACTIVE SEED LOCALIZATION (Left) RIGHT MASTECTOMY WITH SENTINEL LYMPH NODE BIOPSY (Right) RIGHT BREAST RECONSTRUCTION WITH PLACEMENT OF TISSUE EXPANDER, POSSIBLE ACELLULAR DERMIS (Right) as a surgical intervention .  The patient's history has been reviewed, patient examined, no change in status, stable for surgery.  I have reviewed the patient's chart and labs.  Questions were answered to the patient's satisfaction.     Kadeshia Kasparian H

## 2015-04-06 NOTE — Op Note (Signed)
Operative Note   DATE OF OPERATION: 4.3.2017  LOCATION: [facility] OR- observtion  SURGICAL DIVISION: Plastic Surgery  PREOPERATIVE DIAGNOSES:  1. Right breast multifocal cancer  POSTOPERATIVE DIAGNOSES:  same  PROCEDURE:  1. Right breast reconstruction with tissue expander 2. Acellular dermis (Flex HD) for breast reconstruction total 60 cm2  SURGEON: Irene Limbo MD MBA  ASSISTANT: none  ANESTHESIA:  General.   EBL: 100 ml for entire case  COMPLICATIONS: None immediate.   INDICATIONS FOR PROCEDURE:  The patient, Brooke [name], is a 50 y.o. female born on [birth year], is here for immediate reconstruction following mastectomy and SLN with expander.   FINDINGS: Natrelle 133MX-13-T 500 ml tissue expander, initial fill volume 240 ml. SN [id number]  DESCRIPTION OF PROCEDURE:  The patient's operative sites were marked with the patient in the preoperative area. The patient was taken to the operating room. SCDs were placed and IV antibiotics were given. Following completion of mastectomy, the inferior insertions of pectoralis major muscle were elevated continuous with abdominal wall fascia. Limited division at inframammary fold completed. Submuscular dissection completed toward clavicle. Flex HD acellular dermis was fenestrated and sewn to inferior border of pectoralis major muscle with 3-0 vicryl.The cavity was irrigated with solution containing Ancef, gentamicin, and bacitracin and inspected for hemostasis. 26 Fr JP placed in subcutaneous position laterally and submuscular medially and secured with 2-0 nylon. The tissue expander was prepared and inserted into submuscular space and secured to chest wall with 3-0 vicryl. The caudal edge of acelluar dermis was then sewn to Scarpas fascia along the lower mastectomy flap and to chest wall in a three point stitch to recreate the inframammary fold. The lateral border was secured to anterior axillary line of breast on mastectomy flap and to serratus  fascia. Additional redundant skin of inferior mastectomy flap excised and marked for pathology. Skin closure was completed with running 3-0 vicryl in superficial fascia and 4-0 vicryl in dermis. Skin closure completed with 4-0 monocryl subcuticular and tissue adhesive applied to all incisions. Dry dressing and breast binder applied.  The patient was allowed to wake from anesthesia, extubated and taken to the recovery room in satisfactory condition.   SPECIMENS: right inferior mastectomy flap  DRAINS: 19 Fr drain in right reconstructed breast  Irene Limbo, MD [facility] Plastic & Reconstructive Surgery [phone]

## 2015-04-06 NOTE — Transfer of Care (Signed)
Immediate Anesthesia Transfer of Care Note  Patient: Brooke [name]  Procedure(s) Performed: Procedure(s): LEFT BREAST LUMPECTOMY WITH RADIOACTIVE SEED LOCALIZATION (Left) RIGHT MASTECTOMY WITH SENTINEL LYMPH NODE BIOPSY (Right) RIGHT BREAST RECONSTRUCTION WITH PLACEMENT OF TISSUE EXPANDER, POSSIBLE ACELLULAR DERMIS (Right)  Patient Location: PACU  Anesthesia Type:General  Level of Consciousness: awake, oriented and patient cooperative  Airway & Oxygen Therapy: Patient Spontanous Breathing and Patient connected to face mask oxygen  Post-op Assessment: Report given to RN and Post -op Vital signs reviewed and stable  Post vital signs: Reviewed  Last Vitals:  Filed Vitals:   04/06/15 1510 04/06/15 1520  BP: 135/81 134/83  Pulse:    Temp:    Resp:      Complications: No apparent anesthesia complications

## 2015-04-06 NOTE — Progress Notes (Signed)
Pt admitted to 6N06 via stretcher from PACU.  Pt AAO X 3.  Pt on 2L O2 via [place].  Pt has 20G to Lt hand with fluids infusing.  Pt has incision to Rt breast with skin glue C/D/I with ABD pad and breast binder.  SCDs in place.  Report rcvd from Truman Hayward, [place].  Pt has no questions at the moment.  Will continue to monitor.

## 2015-04-06 NOTE — Anesthesia Preprocedure Evaluation (Signed)
Anesthesia Evaluation  Patient identified by MRN, date of birth, ID band Patient awake    Reviewed: Allergy & Precautions, NPO status , Patient's Chart, lab work & pertinent test results  Airway Mallampati: II   Neck ROM: full    Dental   Pulmonary neg pulmonary ROS,    breath sounds clear to auscultation       Cardiovascular negative cardio ROS   Rhythm:regular Rate:Normal     Neuro/Psych  Headaches, Anxiety Depression    GI/Hepatic   Endo/Other  obese  Renal/GU      Musculoskeletal   Abdominal   Peds  Hematology   Anesthesia Other Findings   Reproductive/Obstetrics                             Anesthesia Physical Anesthesia Plan  ASA: II  Anesthesia Plan: General and Regional   Post-op Pain Management:  Regional for Post-op pain   Induction: Intravenous  Airway Management Planned: LMA  Additional Equipment:   Intra-op Plan:   Post-operative Plan:   Informed Consent: I have reviewed the patients History and Physical, chart, labs and discussed the procedure including the risks, benefits and alternatives for the proposed anesthesia with the patient or authorized representative who has indicated his/her understanding and acceptance.     Plan Discussed with: CRNA, Anesthesiologist and Surgeon  Anesthesia Plan Comments:         Anesthesia Quick Evaluation

## 2015-04-06 NOTE — Op Note (Addendum)
04/06/2015  1:52 PM  PATIENT:  Brooke [name], 50 y.o., female, MRN: [id number]  PREOP DIAGNOSIS:  Multifocal RIGHT BREAST CANCER, LEFT BREAST ADH  POSTOP DIAGNOSIS:   Multifocal RIGHT BREAST CANCER (T1, N0), LEFT BREAST ADH (2 o'clock)   PROCEDURE:   Procedure(s):  LEFT BREAST LUMPECTOMY WITH RADIOACTIVE  wire localization,  RIGHT MASTECTOMY WITH SENTINEL LYMPH NODE BIOPSY, Injection of methylene blue (1.0 cc),  RIGHT BREAST RECONSTRUCTION WITH PLACEMENT OF TISSUE EXPANDER, POSSIBLE ACELLULAR DERMIS  SURGEON:   Alphonsa Overall, M.D.  ASSISTANT:   B. Iran Planas, M.D.  ANESTHESIA:   general  Anesthesiologist: Albertha Ghee, MD CRNA: Carney Living, CRNA; Myna Bright, CRNA  General  ASA:  2  EBL:  150  ml  BLOOD ADMINISTERED: none  DRAINS: per Dr. Iran Planas.  SPECIMEN:   Left breast biopsy, right breast (mastectomy)(long suture marks lateral edge of breast, short suture on undersurface of right breast (UIQ) marks visible clip), right chest wall under clip (suture marks lateral), right axillary sentinel lymph node (blue, hot -  Counts 27, background -0)  COUNTS CORRECT:  YES  INDICATIONS FOR PROCEDURE:  Brooke [name] is a 50 y.o. (DOB: 1965/02/04) AA  female whose primary care physician is Eldred Manges, MD and comes for right mastectomy with reconstruction and left breast lumpectomy.   She was originally seen in the [facility] with Drs. Magrinat and Pablo Ledger.  She has multifocal right breast cancer and ADH on a biopsy of her left breast.  Her Oncotype is low - 5.  Her genetics were negative.  So she needs a right mastectomy and a left breas lumpectomy.  This was discussed with the patient.   The indications and risks of the surgery were explained to the patient.  The risks include, but are not limited to, infection, bleeding, and nerve injury. She was supposed to have clip placed in her left breast, but this dislodged and is in the wrong location.  So a  wire was placed this AM for localization.  i also have to retrieve the clip.  OPERATIVE NOTE;  The patient was taken to room # 9 at [facility] where she underwent a general anesthesia  supervised by Anesthesiologist: Albertha Ghee, MD CRNA: Carney Living, CRNA; Myna Bright, CRNA. Both her breast and axilla were prepped with ChloraPrep and sterilely draped.  Because I did not pick up any activity with the Neoprobe, I injected 1.0 cc of 40% methylene blue around the right areola.   A time-out and the surgical check list was reviewed.    I injected about 0.5 mL of 40% methylene blue around her right areola.   I started on her left side where she had the breast biopsy which showed ADH.  She had a wire coming out of the left breast at the 2 o'clock position about 2 cm off the areola.  I used the Neoprobe and found the seed at the skin level.  I made a 4 cm incision taking an ellipse of skin out and cutting out the wire.  Of note, the seed was trapped under the skin and not in the correct position.  This is the reason she had the wire loc.  The actually came free and was found as a separate specimen.  A specimen mammogram was done of the lumpectomy and the clip was towards the inferior edge of the biopsy.  Since the biopsy was benign, I did not do a further excision.  I closed  the biopsy site in layers.  3-0 vicryls for the deep layer and 4-0 monocryl for the skin.   I then turned my attention the right breast.  I made an elliptical incision including the areola in the right breast.  I developed skin flaps medially to the lateral edge of the sternum, inferiorly to the investing fascia of the rectus abdominus muscle, laterally to the anterior edge of the latissimus dorsi muscle, and superiorly to about 2 finger breaths below the clavicle.  The breast was reflected off the pectoralis muscle from medial to lateral.  The lateral attachments in the right axilla were divided and the breast removed.  A  long suture was placed on the lateral aspect of the breast.  On the undersurface of the mastectomy specimen, I did see a "clip".  I marked the clip area with 3-0 Vicryl so pathology would see this.  I spoke to Dr. Tillman Sers on the phone about the findings.  I then removed a 2.5 cm area off the pectoralis muscle just under where the clip was found.  I marked the lateral margin of this specimen, painted it with the breast paint kit (anterior/positive).  This was sent as a separate specimen.   I dissected into the right axilla and found a sentinel lymph node.  The node had counts of 27 with a background count of 0.  The lymph node was blue.  This was sent as a separate specimen.   At this point, Dr. Iran Planas took over the case to do the breast reconstruction on the right.  She will dictate this portion of the operation.   Alphonsa Overall, MD, [facility] Pager: [phone] Office phone:  [phone]

## 2015-04-07 ENCOUNTER — Encounter (HOSPITAL_COMMUNITY): Payer: Self-pay | Admitting: General Practice

## 2015-04-07 DIAGNOSIS — D0511 Intraductal carcinoma in situ of right breast: Secondary | ICD-10-CM | POA: Diagnosis not present

## 2015-04-07 LAB — CBC
HEMATOCRIT: 31.8 % — AB (ref 36.0–46.0)
Hemoglobin: 10 g/dL — ABNORMAL LOW (ref 12.0–15.0)
MCH: 30.5 pg (ref 26.0–34.0)
MCHC: 31.4 g/dL (ref 30.0–36.0)
MCV: 97 fL (ref 78.0–100.0)
Platelets: 267 10*3/uL (ref 150–400)
RBC: 3.28 MIL/uL — ABNORMAL LOW (ref 3.87–5.11)
RDW: 13.2 % (ref 11.5–15.5)
WBC: 9.1 10*3/uL (ref 4.0–10.5)

## 2015-04-07 LAB — GLUCOSE, CAPILLARY: GLUCOSE-CAPILLARY: 157 mg/dL — AB (ref 65–99)

## 2015-04-07 MED ORDER — FLUCONAZOLE 150 MG PO TABS: 150.0000 mg | ORAL_TABLET | Freq: Every day | ORAL | Status: DC

## 2015-04-07 MED ORDER — OXYCODONE HCL 5 MG PO TABS: 5.0000 mg | ORAL_TABLET | ORAL | Status: DC | PRN

## 2015-04-07 MED ORDER — SULFAMETHOXAZOLE-TRIMETHOPRIM 800-160 MG PO TABS: 1.0000 | ORAL_TABLET | Freq: Two times a day (BID) | ORAL | Status: DC

## 2015-04-07 MED ORDER — METHOCARBAMOL 500 MG PO TABS: 500.0000 mg | ORAL_TABLET | Freq: Three times a day (TID) | ORAL | Status: DC | PRN

## 2015-04-07 NOTE — Progress Notes (Signed)
  POD# 1 right mastectomy, TE/ADM reconstruction, left lumpectomy  Events noted, alert and comfortable at time of exam  Temp:  [97.9 F (36.6 C)-99.1 F (37.3 C)] 98.7 F (37.1 C) (04/04 0520) Pulse Rate:  [58-83] 68 (04/04 0520) Resp:  [14-24] 16 (04/04 0520) BP: (105-166)/(45-114) 140/45 mmHg (04/04 0520) SpO2:  [95 %-100 %] 97 % (04/04 0520) Weight:  [111.897 kg (246 lb 11 oz)] 111.897 kg (246 lb 11 oz) (04/03 0948)   PE  Chest incisions dry intact, right chest soft, drain serosanguinous   A/P Agree appears stable for discharge. Rx provided for antibiotics pain meds and muscle relaxant. Patient reports frequent yeast infection with antibiotics and requests Diflucan while on antibiotics.  F/u arranged. Asked staff to acquire smaller breast binder as current too loose.  Irene Limbo, MD [facility] [phone]

## 2015-04-07 NOTE — Progress Notes (Addendum)
@  [id number]  Called to bathroom by sister. Pt felt dizzy, tired and fainted while washing up. Noted sitting in the bedside commode, slumped over. NT assisted pt to recliner and back to bed. Pt felt better afterwards. VS as follows: 98.7,68,16,140/45 O2sats at 97%RA. CBG--157. Notified on call Dr Rosendo Gros and ordered labwork--CBC.  @0650  Dr Lucia Gaskins came in and made aware of incident.

## 2015-04-07 NOTE — Discharge Instructions (Signed)
[facility] - DISCHARGE INSTRUCTIONS TO PATIENT  Activity:  Driving - May drive in 4 or 5 days, if off pain meds and limited discomfort   Lifting - Per Dr. Iran Planas.  Wound Care:   Per Dr. Iran Planas  Diet:  As tolerated  Follow up appointment:  Call Dr. Pollie Friar office [facility]) at [phone] for an appointment in 2 to 3 weeks.  Medications and dosages:  Resume your home medications.  You have a prescription for:  Vicodin  Call Dr. Lucia Gaskins or his office  [phone]) if you have:  Temperature greater than 100.4,  Persistent nausea and vomiting,  Severe uncontrolled pain,  Redness, tenderness, or signs of infection (pain, swelling, redness, odor or green/yellow discharge around the site),  Difficulty breathing, headache or visual disturbances,  Any other questions or concerns you may have after discharge.  In an emergency, call 911 or go to an Emergency Department at a nearby hospital.

## 2015-04-07 NOTE — Discharge Summary (Signed)
Physician Discharge Summary  Patient ID:  Brooke [name]  MRN: [id number]  DOB/AGE: [birth year] 51 y.o.  Admit date: 04/06/2015 Discharge date: 04/07/2015  Discharge Diagnoses:  1.  Multifocal right breast cancer  (final pathology pending) 2.  ADH of left breast, 2 o'clock position.  3.  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 Patsey Berthold [facility]  Active Problems:   Breast cancer in female [facility])  Operation: Procedure(s): LEFT BREAST LUMPECTOMY WITH  wire LOCALIZATION RIGHT MASTECTOMY WITH SENTINEL LYMPH NODE BIOPSY RIGHT BREAST RECONSTRUCTION WITH PLACEMENT OF TISSUE EXPANDER, POSSIBLE ACELLULAR DERMIS on 04/06/2015 - Drs. Tish Begin/Thimmappa  Discharged Condition: good  Hospital Course: Brooke [name] is an 50 y.o. female whose primary care physician is Eldred Manges, MD and who was admitted 04/06/2015 with a chief complaint of  multifocal right breast cancer and ADH of left breast.   She was brought to the operating room on 04/06/2015 and underwent  LEFT BREAST LUMPECTOMY WITH  wire LOCALIZATION RIGHT MASTECTOMY WITH SENTINEL LYMPH NODE BIOPSY, RIGHT BREAST RECONSTRUCTION WITH PLACEMENT OF TISSUE EXPANDER, POSSIBLE ACELLULAR DERMIS.   She is now one day post op.  She had a vasovagal episode in the bathroom this morning.  She is sore in her right chest and right axilla.  But she is otherwise doing well. I think that she is okay to go home.  The discharge instructions were reviewed with the patient.  Consults: None  Significant Diagnostic Studies: Results for orders placed or performed during the hospital encounter of 04/06/15  Glucose, capillary  Result Value Ref Range   Glucose-Capillary 157 (H) 65 - 99 mg/dL  CBC  Result Value Ref Range   WBC 9.1 4.0 - 10.5 K/uL   RBC 3.28 (L) 3.87 - 5.11 MIL/uL   Hemoglobin 10.0 (L) 12.0 - 15.0 g/dL   HCT 31.8 (L) 36.0 - 46.0 %   MCV 97.0 78.0 - 100.0 fL   MCH 30.5 26.0 - 34.0 pg   MCHC 31.4 30.0 - 36.0 g/dL    RDW 13.2 11.5 - 15.5 %   Platelets 267 150 - 400 K/uL    Nm Sentinel Node Inj-no Rpt (breast)  04/06/2015  CLINICAL DATA: right breast cancer Sulfur colloid was injected intradermally by the nuclear medicine technologist for breast cancer sentinel node localization.   Mm Breast Surgical Specimen  04/06/2015  CLINICAL DATA:  Wire localization of the left breast was performed today. The patient has a recent diagnosis of atypical ductal hyperplasia of the left breast. The biopsy site was marked with a cylindrical biopsy clip. An attempt at radioactive seed localization of the left breast on 04/03/2015 was unsuccessful due to migration of the radioactive seed. EXAM: SPECIMEN RADIOGRAPH OF THE LEFT BREAST COMPARISON:  Previous exam(s). FINDINGS: Status post excision of the left breast. The wire tip and biopsy marker clip are present and are marked for pathology. A separate image was sent, showing that the radioactive seed had been successfully removed from the subdermal left breast. IMPRESSION: Specimen radiograph of the left breast. Electronically Signed   By: Curlene Dolphin M.D.   On: 04/06/2015 12:51   Mm Digital Diagnostic Unilat L  03/10/2015  CLINICAL DATA:  Status post MR guided core biopsy of 2 areas of enhancement in the upper-outer quadrant of the left breast. EXAM: DIAGNOSTIC LEFT MAMMOGRAM POST MRI BIOPSY x2 COMPARISON:  Previous exam(s). FINDINGS: Mammographic images were obtained following MRI guided biopsy of 2 areas of enhancement within the upper-outer quadrant of the left  breast. Site 1: A cylinder-shaped clip is identified in the anterior upper outer quadrant of the left breast. Site 2: A bow tie shaped clip is identified in the posterior upper outer quadrant of the left breast. The 2 clips are approximately 10.5 cm apart. IMPRESSION: Tissue marker clips are in expected locations following MR guided core biopsies. Final Assessment: Post Procedure Mammograms for Marker Placement Electronically  Signed   By: Nolon Nations M.D.   On: 03/10/2015 09:56   Mm Lt Radioactive Seed Loc Mammo Guide  04/03/2015  CLINICAL DATA:  Left breast atypical ductal hyperplasia for seed placement prior to surgery. EXAM: MAMMOGRAPHIC GUIDED RADIOACTIVE SEED LOCALIZATION OF THE LEFT BREAST COMPARISON:  Previous exam(s). FINDINGS: Patient presents for radioactive seed localization prior to left breast surgery. I met with the patient and we discussed the procedure of seed localization including benefits and alternatives. We discussed the high likelihood of a successful procedure. We discussed the risks of the procedure including infection, bleeding, tissue injury and further surgery. We discussed the low dose of radioactivity involved in the procedure. Informed, written consent was given. The usual time-out protocol was performed immediately prior to the procedure. Using mammographic guidance, sterile technique, 1% lidocaine and an I-125 radioactive seed, the anterior is cylinder shaped biopsy clip was attempted to be localized via the cranial approach. The seed migrated to the subdermal region. I discussed the case with Dr. Lucia Gaskins. The patient will return Monday April 3rd prior to surgery for a needle localization of the anterior is cylinder shaped biopsy clip via the cranial approach. Follow-up survey of the patient confirms presence of the radioactive seed. Order number of I-125 seed:  [id number]. Total activity:  6.43 millicurie  Reference Date: March 27, 2015 The patient tolerated the procedure well and was released from the [place]. She was given instructions regarding seed removal. IMPRESSION: Radioactive seed localization left breast. No apparent complications. Electronically Signed   By: Abelardo Diesel M.D.   On: 04/03/2015 15:11   Mr Aundra Millet Breast Bx Johnella Moloney Dev 1st Lesion Image Bx Spec Mr Guide  03/11/2015  ADDENDUM REPORT: 03/11/2015 15:41 ADDENDUM: Site 1: Pathology revealed ATYPICAL DUCTAL HYPERPLASIA, USUAL  DUCTAL HYPERPLASIA, FIBROCYSTIC CHANGE of the upper outer quadrant of the Left breast, anterior, with excision recommended. Site 2: FIBROCYSTIC CHANGE, USUAL DUCTAL HYPERPLASIA of the upper outer quadrant of the Left breast, posterior. Findings are found to be concordant by Dr. Nolon Nations. Pathology results were discussed with the patient by telephone. The patient reported tenderness and is doing well after the biopsy. Post biopsy instructions and care were reviewed and questions were answered. The patient was encouraged to call [facility] for any additional concerns. The patient has a recent diagnosis of right breast cancer and should follow her outlined treatment plan. Pathology results reported by Terie Purser, RN on 03/11/2015. Electronically Signed   By: Nolon Nations M.D.   On: 03/11/2015 15:41  03/11/2015  CLINICAL DATA:  Recently diagnosed right breast cancer. Patient presents for MR guided core biopsy of 2 areas of enhancement in the left breast. EXAM: MRI GUIDED CORE NEEDLE BIOPSY OF THE LEFT BREAST x2 TECHNIQUE: Multiplanar, multisequence MR imaging of the left breast was performed both before and after administration of intravenous contrast. CONTRAST:  40m MULTIHANCE GADOBENATE DIMEGLUMINE 529 MG/ML IV SOLN COMPARISON:  Previous exams. FINDINGS: I met with the patient, and we discussed the procedure of MRI guided biopsy, including risks, benefits, and alternatives. Specifically, we discussed the  risks of infection, bleeding, tissue injury, clip migration, and inadequate sampling. Informed, written consent was given. The usual time out protocol was performed immediately prior to the procedure. Site 1: Using sterile technique, 1% Lidocaine, MRI guidance, and a 9 gauge vacuum assisted device, biopsy was performed of anterior area of enhancement within the upper-outer quadrant of the left breast using a lateral approach. At the conclusion of the procedure, a  cylinder-shaped tissue marker clip was deployed into the biopsy cavity. Site 2: Using the same technique, biopsy was performed of the posterior area of enhancement within the upper-outer quadrant of the left breast using a lateral approach. At the conclusion of the procedure a bow tie shaped tissue marker clip was deployed into the biopsy cavity. Follow-up 2-view mammogram was performed and dictated separately. IMPRESSION: MRI guided biopsy of 2 areas of enhancement in the upper-outer quadrant of the left breast. No apparent complications. Electronically Signed: By: Nolon Nations M.D. On: 03/10/2015 09:52   Mr Aundra Millet Breast Bx W Loc Dev Ea Add Lesion Image Bx Spec Mr Guide  03/11/2015  ADDENDUM REPORT: 03/11/2015 15:41 ADDENDUM: Site 1: Pathology revealed ATYPICAL DUCTAL HYPERPLASIA, USUAL DUCTAL HYPERPLASIA, FIBROCYSTIC CHANGE of the upper outer quadrant of the Left breast, anterior, with excision recommended. Site 2: FIBROCYSTIC CHANGE, USUAL DUCTAL HYPERPLASIA of the upper outer quadrant of the Left breast, posterior. Findings are found to be concordant by Dr. Nolon Nations. Pathology results were discussed with the patient by telephone. The patient reported tenderness and is doing well after the biopsy. Post biopsy instructions and care were reviewed and questions were answered. The patient was encouraged to call [facility] for any additional concerns. The patient has a recent diagnosis of right breast cancer and should follow her outlined treatment plan. Pathology results reported by Terie Purser, RN on 03/11/2015. Electronically Signed   By: Nolon Nations M.D.   On: 03/11/2015 15:41  03/11/2015  CLINICAL DATA:  Recently diagnosed right breast cancer. Patient presents for MR guided core biopsy of 2 areas of enhancement in the left breast. EXAM: MRI GUIDED CORE NEEDLE BIOPSY OF THE LEFT BREAST x2 TECHNIQUE: Multiplanar, multisequence MR imaging of the left breast was performed  both before and after administration of intravenous contrast. CONTRAST:  51m MULTIHANCE GADOBENATE DIMEGLUMINE 529 MG/ML IV SOLN COMPARISON:  Previous exams. FINDINGS: I met with the patient, and we discussed the procedure of MRI guided biopsy, including risks, benefits, and alternatives. Specifically, we discussed the risks of infection, bleeding, tissue injury, clip migration, and inadequate sampling. Informed, written consent was given. The usual time out protocol was performed immediately prior to the procedure. Site 1: Using sterile technique, 1% Lidocaine, MRI guidance, and a 9 gauge vacuum assisted device, biopsy was performed of anterior area of enhancement within the upper-outer quadrant of the left breast using a lateral approach. At the conclusion of the procedure, a cylinder-shaped tissue marker clip was deployed into the biopsy cavity. Site 2: Using the same technique, biopsy was performed of the posterior area of enhancement within the upper-outer quadrant of the left breast using a lateral approach. At the conclusion of the procedure a bow tie shaped tissue marker clip was deployed into the biopsy cavity. Follow-up 2-view mammogram was performed and dictated separately. IMPRESSION: MRI guided biopsy of 2 areas of enhancement in the upper-outer quadrant of the left breast. No apparent complications. Electronically Signed: By: ENolon NationsM.D. On: 03/10/2015 09:52   Mm Lt Plc Breast Loc Dev  1st Lesion  Inc Mammo Guide  04/06/2015  CLINICAL DATA:  Atypical ductal hyperplasia of the left breast. The patient presents for wire localization of the biopsy site denoted by a metallic cylindrical biopsy clip in the anterior third of the upper outer quadrant. Radioactive seed localization of this site was attempted on 04/03/2015, but the radioactive seed migrated to a subdermal location, as previously dictated. The patient has RIGHT breast cancer and is scheduled for a right mastectomy today. EXAM:  NEEDLE LOCALIZATION OF THE LEFT BREAST WITH MAMMO GUIDANCE COMPARISON:  Previous exams. FINDINGS: Patient presents for needle localization prior to excisional biopsy of the left breast. I met with the patient and we discussed the procedure of needle localization including benefits and alternatives. We discussed the high likelihood of a successful procedure. We discussed the risks of the procedure, including infection, bleeding, tissue injury, and further surgery. Informed, written consent was given. The usual time-out protocol was performed immediately prior to the procedure. Using mammographic guidance, sterile technique, 1% lidocaine and a 7 cm modified Kopans needle, a cylindrical biopsy clip in the anterior third upper outer quadrant was localized using superior to inferior approach. The radioactive seed is in a subdermal location, very close to the skin entry site of the wire placed today. The images were marked for Dr. Lucia Gaskins. IMPRESSION: Needle localization left breast. No apparent complications. Electronically Signed   By: Curlene Dolphin M.D.   On: 04/06/2015 09:36    Discharge Exam:  Filed Vitals:   04/07/15 0151 04/07/15 0520  BP: 105/54 140/45  Pulse: 73 68  Temp: 99.1 F (37.3 C) 98.7 F (37.1 C)  Resp: 16 16    General: WN AA F who is alert and generally healthy appearing.  Lungs: Clear to auscultation and symmetric breath sounds. Heart:  RRR. No murmur or rub. Chest:  The wounds look okay.   Right chest drain with 405 cc recorded last 24 hours.  Discharge Medications:     Medication List    TAKE these medications        acetaminophen 325 MG tablet  Commonly known as:  TYLENOL  Take 325 mg by mouth every 6 (six) hours as needed for mild pain or moderate pain.     aspirin-sod bicarb-citric acid 325 MG Tbef tablet  Commonly known as:  ALKA-SELTZER  Take 325 mg by mouth every 8 (eight) hours as needed (reflux).     diazepam 5 MG tablet  Commonly known as:  VALIUM  1 po  prior to procedure as needed     MULTIVITAMIN ADULT PO  Take 1 tablet by mouth daily.     tamoxifen 20 MG tablet  Commonly known as:  NOLVADEX  Take 20 mg by mouth daily.     valACYclovir 500 MG tablet  Commonly known as:  VALTREX  Take 500 mg by mouth daily.        Disposition:       Discharge Instructions    Diet - low sodium heart healthy    Complete by:  As directed      Increase activity slowly    Complete by:  As directed            Follow-up Information    Follow up with [facility], Arnoldo Hooker, MD In 1 week.   Specialty:  Plastic Surgery   Why:  as scheduled   Contact information:   [place] [address] [phone]       Follow  up with Jamesmichael Shadd H, MD. Schedule an appointment as soon as possible for a visit in 2 weeks.   Specialty:  General Surgery   Contact information:   [address] [place] [address] [phone]      Activity:  Driving - May drive in 4 or 5 days, if off pain meds and limited discomfort   Lifting - Per Dr. Iran Planas.  Wound Care:   Per Dr. Iran Planas  Diet:  As tolerated  Follow up appointment:  Call Dr. Pollie Friar office [facility]) at [phone] for an appointment in 2 to 3 weeks.  Medications and dosages:  Resume your home medications.  You have a prescription for:  Vicodin  Signed: Alphonsa Overall, M.D., [facility] Office:  [phone]  04/07/2015, 7:14 AM

## 2015-04-07 NOTE — Progress Notes (Signed)
IV removed. JP emptied. Dressings changed. Smaller binder applied.  AVS given to patient. Understanding demonstrated. Belongings packed. Transportation with family.

## 2015-04-08 NOTE — Anesthesia Postprocedure Evaluation (Signed)
Anesthesia Post Note  Patient: Brooke [name]  Procedure(s) Performed: Procedure(s) (LRB): LEFT BREAST LUMPECTOMY WITH RADIOACTIVE SEED LOCALIZATION (Left) RIGHT MASTECTOMY WITH SENTINEL LYMPH NODE BIOPSY (Right) RIGHT BREAST RECONSTRUCTION WITH PLACEMENT OF TISSUE EXPANDER, POSSIBLE ACELLULAR DERMIS (Right)  Patient location during evaluation: PACU Anesthesia Type: General Level of consciousness: awake and alert Pain management: pain level controlled Vital Signs Assessment: post-procedure vital signs reviewed and stable Respiratory status: spontaneous breathing, nonlabored ventilation, respiratory function stable and patient connected to nasal cannula oxygen Cardiovascular status: blood pressure returned to baseline and stable Postop Assessment: no signs of nausea or vomiting Anesthetic complications: no    Last Vitals:  Filed Vitals:   04/07/15 0151 04/07/15 0520  BP: 105/54 140/45  Pulse: 73 68  Temp: 37.3 C 37.1 C  Resp: 16 16    Last Pain:  Filed Vitals:   04/07/15 1100  PainSc: 3                  Tiajuana Amass

## 2015-04-09 NOTE — Addendum Note (Signed)
Addendum  created 04/09/15 [id number] by Albertha Ghee, MD   Modules edited: Anesthesia Blocks and Procedures, Clinical Notes   Clinical Notes:  File: [id number]

## 2015-04-10 ENCOUNTER — Other Ambulatory Visit: Payer: Self-pay | Admitting: Oncology

## 2015-04-14 DIAGNOSIS — Z9011 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: Secondary | ICD-10-CM | POA: Insufficient documentation

## 2015-04-22 ENCOUNTER — Ambulatory Visit: Payer: BC Managed Care – PPO | Attending: Surgery | Admitting: Physical Therapy

## 2015-04-22 ENCOUNTER — Other Ambulatory Visit: Payer: Self-pay | Admitting: *Deleted

## 2015-04-22 DIAGNOSIS — M25511 Pain in right shoulder: Secondary | ICD-10-CM | POA: Insufficient documentation

## 2015-04-22 DIAGNOSIS — R293 Abnormal posture: Secondary | ICD-10-CM | POA: Insufficient documentation

## 2015-04-22 DIAGNOSIS — C50211 Malignant neoplasm of upper-inner quadrant of right female breast: Secondary | ICD-10-CM

## 2015-04-22 DIAGNOSIS — N6092 Unspecified benign mammary dysplasia of left breast: Secondary | ICD-10-CM | POA: Insufficient documentation

## 2015-04-22 DIAGNOSIS — M25611 Stiffness of right shoulder, not elsewhere classified: Secondary | ICD-10-CM | POA: Diagnosis present

## 2015-04-22 DIAGNOSIS — M6281 Muscle weakness (generalized): Secondary | ICD-10-CM

## 2015-04-22 NOTE — Therapy (Signed)
[place], [place], [address] Phone: [phone]   Fax:  [phone]  Physical Therapy Evaluation  Patient Details  Name: Brooke [name] MRN: [id number] Date of Birth: July 30, 1965 Referring Provider: Thimmappa   Encounter Date: 04/22/2015      PT End of Session - 04/22/15 1355    Visit Number 1   Number of Visits 9   Date for PT Re-Evaluation 05/22/15   PT Start Time 6962   PT Stop Time 1100   PT Time Calculation (min) 45 min   Activity Tolerance Patient tolerated treatment well   Behavior During Therapy [facility] for tasks assessed/performed      Past Medical History  Diagnosis Date  . Anemia   . Depression, postpartum   . Yeast infection   . Fatigue   . Chronic headaches   . Anxiety   . Breast cancer of upper-inner quadrant of right female breast ([place]) 02/18/2015  . Hot flashes     Past Surgical History  Procedure Laterality Date  . Gastric bypass    . Tubal ligation    . Cholecystectomy    . Mastectomy w/ sentinel node biopsy Right   . Breast lumpectomy with radioactive seed localization Left 04/06/2015  . Breast lumpectomy with radioactive seed localization Left 04/06/2015    Procedure: LEFT BREAST LUMPECTOMY WITH RADIOACTIVE SEED LOCALIZATION;  Surgeon: Alphonsa Overall, MD;  Location: [place];  Service: General;  Laterality: Left;  Marland Kitchen Mastectomy w/ sentinel node biopsy Right 04/06/2015    Procedure: RIGHT MASTECTOMY WITH SENTINEL LYMPH NODE BIOPSY;  Surgeon: Alphonsa Overall, MD;  Location: [place];  Service: General;  Laterality: Right;  . Breast reconstruction with placement of tissue expander and flex hd (acellular hydrated dermis) Right 04/06/2015    Procedure: RIGHT BREAST RECONSTRUCTION WITH PLACEMENT OF TISSUE EXPANDER, POSSIBLE ACELLULAR DERMIS;  Surgeon: Irene Limbo, MD;  Location: [place];  Service: Plastics;  Laterality: Right;    There were no vitals filed for this visit.       Subjective  Assessment - 04/22/15 1031    Subjective "I had surgery on April 3"   She is here to get her arm better.  She will have fills of her expander and an implant on the right side and a reduction on the left sometime in the future.    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  She has 2 areas which are both in the upper inner quadrant measuring 10 cm apart.  One area is a 2 cm mass and the other is a area of calcs.  Both are ER/PR positive and HER2 negative with a Ki67 of 25%.   On 04/06/3015 she had right masecectomy  with (27?)lymph nodes removed with immedicate expander placement and left lumpectomy with no nodes  removed. She does not have to have chemo or radiation . She currently has one drain implace and hopefully will have that removed next  week.  Past hisory includes a gastric bypass surgery.    Patient Stated Goals to get this soreness out of my arm and make sure I don't get lymphedema    Currently in Pain? Yes   Pain Score 5    Pain Location Axilla  also at expander    Pain Orientation Right   Pain Descriptors / Indicators Sore   Pain Type Surgical pain   Pain Radiating Towards at expander    Pain Onset 1 to 4 weeks ago  Pain Frequency Intermittent   Aggravating Factors  , certain position of drain    Pain Relieving Factors medication and prayer    Multiple Pain Sites Yes            [facility] PT Assessment - 04/22/15 0001    Assessment   Medical Diagnosis Right breast cancer   Referring Provider Thimmappa    Onset Date/Surgical Date 01/30/15   Hand Dominance Right   Prior Therapy none   Precautions   Precautions Other (comment)   Precaution Comments Active breast cancer   Restrictions   Weight Bearing Restrictions No   Balance Screen   Has the patient fallen in the past 6 months No   Has the patient had a decrease in activity level because of a fear of falling?  No   Is the patient reluctant to leave their home because of a fear  of falling?  No   Home Environment   Living Environment Private residence   Living Arrangements Spouse/significant other;Children  Husband and 61 y.o. 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  wants to go back to [employer] and do a strengthening program    Cognition   Overall Cognitive Status Within Functional Limits for tasks assessed   Behaviors --   Observation/Other Assessments   Observations Pt is wearing pink bandeau.  One drain in right side with light red fluid with some off while sediment in drain and tubing. healing incision with steri-strips at right chest with expander in place, healing incision at left breast with steri strips in place    Observation/Other Assessments-Edema    Edema --  mild fullnessright chest and right posterior axilla    Coordination   Gross Motor Movements are Fluid and Coordinated Yes   Posture/Postural Control   Posture/Postural Control Postural limitations   Postural Limitations Rounded Shoulders;Forward head   Posture Comments scapular movement appears symmetrical    AROM   Right Shoulder Extension --   Right Shoulder Flexion 125 Degrees   Right Shoulder ABduction 114 Degrees   Right Shoulder Internal Rotation 55 Degrees   Right Shoulder External Rotation 73 Degrees   Left Shoulder Extension --   Left Shoulder Flexion 158 Degrees   Left Shoulder ABduction 149 Degrees   Left Shoulder Internal Rotation 65 Degrees   Left Shoulder External Rotation 80 Degrees   Strength   Overall Strength Within functional limits for tasks performed  3-/5 right shoulder limited by pain    Palpation   Palpation comment full at right chest and posterior axilla            LYMPHEDEMA/ONCOLOGY QUESTIONNAIRE - 04/22/15 1050    Right Upper Extremity Lymphedema   15 cm  Proximal to Olecranon Process 35.6 cm   10 cm Proximal to Olecranon Process 34.5 cm   Olecranon Process 26.9 cm   15 cm Proximal to Ulnar Styloid Process 26 cm   10 cm Proximal to Ulnar Styloid Process 21.5 cm   Just Proximal to Ulnar Styloid Process 15.5 cm   Across Hand at [employer] 20 cm   At [place] of 2nd Digit 6 cm   Other fullness at anterior chest and scapular area            Quick Dash - 04/22/15 0001  Open a tight or new jar Mild difficulty   Do heavy household chores (wash walls, wash floors) Severe difficulty   Carry a shopping bag or briefcase Severe difficulty   Wash your back Severe difficulty   Use a knife to cut food Mild difficulty   Recreational activities in which you take some force or impact through your arm, shoulder, or hand (golf, hammering, tennis) Unable   During the past week, to what extent has your arm, shoulder or hand problem interfered with your normal social activities with family, friends, neighbors, or groups? Quite a bit   During the past week, to what extent has your arm, shoulder or hand problem limited your work or other regular daily activities Quite a bit   Arm, shoulder, or hand pain. Moderate   Tingling (pins and needles) in your arm, shoulder, or hand Mild   Difficulty Sleeping Mild difficulty   DASH Score 56.82 %             [facility] Adult PT Treatment/Exercise - 04/22/15 0001    Self-Care   Self-Care Other Self-Care Comments   Other Self-Care Comments  encouraged pt to come to ABC class to learm about lymphedema risk reduction and issued flyer with times andnumber to call to regiser.   Shoulder Exercises: Supine   Other Supine Exercises dowel rod flexion    Other Supine Exercises reviewd post op exericse issued at multidisciplinary clinic,  Pt not  able to do hands behind head and elbows wide                 PT Education - 04/22/15 1353    Education provided Yes   Education Details supine dowel rod exercise     Person(s) Educated Patient   Methods Explanation;Demonstration;Handout   Comprehension Verbalized understanding;Returned demonstration           Short Term Clinic Goals - 04/22/15 1402    CC Short Term Goal  #1   Title short term goals= long term goals           Breast Clinic Goals - 02/25/15 [phone]    Patient will be able to verbalize understanding of pertinent lymphedema risk reduction practices relevant to her diagnosis specifically related to skin care.   Time 1   Period Days   Status Achieved   Patient will be able to return demonstrate and/or verbalize understanding of the post-op home exercise program related to regaining shoulder range of motion.   Time 1   Period Days   Status Achieved   Patient will be able to verbalize understanding of the importance of attending the postoperative After Breast Cancer Class for further lymphedema risk reduction education and therapeutic exercise.   Time 1   Period Days   Status Achieved          Long Term Clinic Goals - 04/22/15 1403    CC Long Term Goal  #1   Title Patient with verbalize an understanding of lymphedema risk reduction precautions   Time 4   Period Weeks   Status New   CC Long Term Goal  #2   Title Patient will be independent in a  home exercise program for shoulder range of motion and strength    Time 4   Period Weeks   Status New   CC Long Term Goal  #3   Title Patient will report a decrease in pain by 75% so they can perform daily activities with greater ease    Time  4   Period Weeks   Status New   CC Long Term Goal  #4   Title Patient will decrease the DASH score to <  40   to demonstrate increased functional use of upper extremity   Baseline 56.82 on 04/22/2015   Time 4   Period Weeks   Status New            Plan - 04/22/15 1357    Clinical Impression Statement Pt is just over 2 week post sugery and still has a drain in, but did well with intial range of motion.  She is at risk for lymphedma  and will benefit from PT for education in risk reduction and exercise to improve range of motion and strength.    Rehab Potential Excellent   Clinical Impairments Affecting Rehab Potential axillary node dissection on the right    PT Frequency 2x / week   PT Duration 4 weeks   PT Treatment/Interventions ADLs/Self Care Home Management;DME Instruction;Patient/family education;Passive range of motion;Therapeutic activities;Therapeutic exercise;Manual techniques;Taping;Manual lymph drainage   PT Next Visit Plan Review and continue with home exericse for range of motion.  Teach [place] Spinal decompression to begin to work on posture and scapular strength. make sure pt is signed up for ABC class    Consulted and Agree with Plan of Care Patient      Patient will benefit from skilled therapeutic intervention in order to improve the following deficits and impairments:  Decreased strength, Pain, Decreased knowledge of precautions, Impaired UE functional use, Decreased range of motion, Increased edema, Postural dysfunction  Visit Diagnosis: Stiffness of right shoulder, not elsewhere classified - Plan: PT plan of care cert/re-cert  Abnormal posture - Plan: PT plan of care cert/re-cert  Pain in right shoulder - Plan: PT plan of care cert/re-cert  Muscle weakness (generalized) - Plan: PT plan of care cert/re-cert     Problem List Patient Active Problem List   Diagnosis Date Noted  . Breast cancer in female [facility]) 04/06/2015  . Genetic testing 03/09/2015  . Breast cancer of upper-inner quadrant of right female breast ([place]) 02/18/2015  . Depression, postpartum   . Yeast infection   . Fatigue   . Chronic headaches    Donato Heinz. Owens Shark, PT  04/22/2015, 2:10 PM  [place], [place], [address] Phone: [phone]   Fax:  [phone]  Name: ELCIE PELSTER MRN: [id number] Date of Birth: 1965/08/16

## 2015-04-22 NOTE — Patient Instructions (Signed)
Cane Overhead - Supine  Hold cane at thighs with both hands, extend arms straight over head. Repeat __5_ times. Do _3__ times per day.  External Rotation (Eccentric), Active-Assist - Supine (Cane) .

## 2015-04-23 ENCOUNTER — Other Ambulatory Visit: Payer: Self-pay

## 2015-04-23 ENCOUNTER — Telehealth: Payer: Self-pay | Admitting: Oncology

## 2015-04-23 DIAGNOSIS — C50211 Malignant neoplasm of upper-inner quadrant of right female breast: Secondary | ICD-10-CM

## 2015-04-23 NOTE — Telephone Encounter (Signed)
Faxed pt office notes to [facility] [phone]

## 2015-04-24 ENCOUNTER — Other Ambulatory Visit (HOSPITAL_BASED_OUTPATIENT_CLINIC_OR_DEPARTMENT_OTHER): Payer: BC Managed Care – PPO

## 2015-04-24 ENCOUNTER — Telehealth: Payer: Self-pay | Admitting: Oncology

## 2015-04-24 ENCOUNTER — Ambulatory Visit (HOSPITAL_BASED_OUTPATIENT_CLINIC_OR_DEPARTMENT_OTHER): Payer: BC Managed Care – PPO | Admitting: Oncology

## 2015-04-24 VITALS — BP 112/75 | HR 64 | Temp 98.3°F | Resp 18 | Ht 71.0 in | Wt 236.6 lb

## 2015-04-24 DIAGNOSIS — Z79810 Long term (current) use of selective estrogen receptor modulators (SERMs): Secondary | ICD-10-CM

## 2015-04-24 DIAGNOSIS — Z170 Estrogen receptor positive status [ER+]: Secondary | ICD-10-CM

## 2015-04-24 DIAGNOSIS — C50211 Malignant neoplasm of upper-inner quadrant of right female breast: Secondary | ICD-10-CM

## 2015-04-24 LAB — COMPREHENSIVE METABOLIC PANEL
ALBUMIN: 3.3 g/dL — AB (ref 3.5–5.0)
ALT: 11 U/L (ref 0–55)
AST: 18 U/L (ref 5–34)
Alkaline Phosphatase: 53 U/L (ref 40–150)
Anion Gap: 7 mEq/L (ref 3–11)
BUN: 11.5 mg/dL (ref 7.0–26.0)
CALCIUM: 8.6 mg/dL (ref 8.4–10.4)
CHLORIDE: 110 meq/L — AB (ref 98–109)
CO2: 23 mEq/L (ref 22–29)
CREATININE: 0.8 mg/dL (ref 0.6–1.1)
EGFR: >90 mL/min/{1.73_m2} (ref >90)
GLUCOSE: 100 mg/dL (ref 70–140)
POTASSIUM: 4.1 meq/L (ref 3.5–5.1)
SODIUM: 141 meq/L (ref 136–145)
Total Bilirubin: <0.3 mg/dL (ref 0.20–1.20)
Total Protein: 6.4 g/dL (ref 6.4–8.3)

## 2015-04-24 LAB — CBC WITH DIFFERENTIAL/PLATELET
BASO%: 1.7 % (ref 0.0–2.0)
BASOS ABS: 0.1 10*3/uL (ref 0.0–0.1)
EOS%: 7.2 % — AB (ref 0.0–7.0)
Eosinophils Absolute: 0.4 10*3/uL (ref 0.0–0.5)
HEMATOCRIT: 33.6 % — AB (ref 34.8–46.6)
HEMOGLOBIN: 10.8 g/dL — AB (ref 11.6–15.9)
LYMPH#: 2.3 10*3/uL (ref 0.9–3.3)
LYMPH%: 43.6 % (ref 14.0–49.7)
MCH: 30.2 pg (ref 25.1–34.0)
MCHC: 32.2 g/dL (ref 31.5–36.0)
MCV: 93.8 fL (ref 79.5–101.0)
MONO#: 0.4 10*3/uL (ref 0.1–0.9)
MONO%: 7.6 % (ref 0.0–14.0)
NEUT#: 2.1 10*3/uL (ref 1.5–6.5)
NEUT%: 39.9 % (ref 38.4–76.8)
Platelets: 338 10*3/uL (ref 145–400)
RBC: 3.58 10*6/uL — ABNORMAL LOW (ref 3.70–5.45)
RDW: 12.8 % (ref 11.2–14.5)
WBC: 5.2 10*3/uL (ref 3.9–10.3)

## 2015-04-24 MED ORDER — TAMOXIFEN CITRATE 20 MG PO TABS: 20.0000 mg | ORAL_TABLET | Freq: Every day | ORAL | Status: DC

## 2015-04-24 NOTE — Telephone Encounter (Signed)
appt made and avs printed °

## 2015-04-24 NOTE — Progress Notes (Signed)
[place]  [phone] Fax:([phone]) [phone]     ID: Brooke [name] DOB: Mar 27, 1965  MR#: [id number]  DJT#:[id number]  Patient Care Team: Eldred Manges, MD as PCP - General (Obstetrics and Gynecology) Chauncey Cruel, MD as Consulting Physician (Oncology) Thea Silversmith, MD as Consulting Physician (Radiation Oncology) Alphonsa Overall, MD as Consulting Physician (General Surgery) Sylvan Cheese, NP as Nurse Practitioner (Hematology and Oncology) Irene Limbo, MD as Consulting Physician (Plastic Surgery) PCP: Eldred Manges, MD OTHER MD: [facility]  CHIEF COMPLAINT: Estrogen receptor positive breast cancer  CURRENT TREATMENT: awaiting definitive surgery   BREAST CANCER HISTORY: From the original intake note:  [name] had routine gynecologic follow-up with Dr. Caralee Ates office and as part of that exam she had screening bilateral mammography which showed a possible mass and calcifications in the right breast. She was referred to the [facility] where on 02/12/2015 she had a right diagnostic mammography with tomo synthesis and right breast ultrasonography. In the upper inner quadrant of the right breast there was a spiculated mass measuring approximately 1.5 cm. Posterior to this mass, approximately 5.5 cm away, there was a subtle area of pleomorphic calcifications spanning 1 cm. By physical exam the upper inner quadrant right breast mass was palpable 2-3 cm from the nipple. By ultrasound this proved to be irregular and heterogeneous and measuring approximately 2.2 cm. The right axilla was benign.  On 02/16/2015 the patient underwent biopsy of the right breast mass in question and also a more posterior area of calcifications, not seen on the original mammogram, 10 cm away from the biopsied mass. 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 Biopsy of the more posterior area of calcifications also showed invasive as well as in situ ductal carcinoma, grade 1 or 2, estrogen receptor 95% positive, with strong staining intensity, progesterone receptor 45% positive, with moderate staining intensity, with an MIB-1 of 25%, and HER-2 also negative, with a signals ratio of 1.48 and the number per cell being 2.00.  The patient's subsequent history is as detailed below  INTERVAL HISTORY: Brooke [name] returns today for follow-up of her right-sided breast cancer. Since her last visit here she underwent right mastectomy with sentinel lymph node sampling and also left lumpectomy, on 04/06/2015 [[id number]]. On the left side there was only the usual ductal hyperplasia, with no atypia or tumor.  On the right side there were 2 foci of invasive ductal carcinoma, larger measuring 2.0 cm, the smaller measured 0.5 cm, the larger being grade 2, and HER-2 was obtained on both these, and was negative and both (the signals ratio was 1.21 and 1.50, and the number per cell was 2.10 and 2.85). Margins were negative. A total of 3 sentinel and 5 non-sentinel lymph nodes were clear.  Her case was presented at the multidisciplinary breast cancer clinic on 04/20/2025. At that time it was felt that the patient did not need any radiation. She needed only hormone treatments. She is here today to discuss those options.  REVIEW OF SYSTEMS: Riah  Still has fairly significant pain, and she takes Aleve during the day for that, with food. She takes an oxycodone at bedtime. That doesn't cause her nausea or constipation. She had no fever, unusual bleeding, or other complications from the surgery. She  still has not gotten herself to look at the scars however. She's feels the reconstruction is going well and she is going to have a definitive saline implant placed at  some point in the next 3 months. Otherwise a detailed review of systems today was stable  PAST MEDICAL HISTORY: Past Medical History  Diagnosis Date  . Anemia   . Depression, postpartum   . Yeast infection   . Fatigue   . Chronic headaches   . Anxiety   . Breast cancer of upper-inner quadrant of right female breast (Brooke [name]) 02/18/2015  . Hot flashes     PAST SURGICAL HISTORY: Past Surgical History  Procedure Laterality Date  . Gastric bypass    . Tubal ligation    . Cholecystectomy    . Mastectomy w/ sentinel node biopsy Right   . Breast lumpectomy with radioactive seed localization Left 04/06/2015  . Breast lumpectomy with radioactive seed localization Left 04/06/2015    Procedure: LEFT BREAST LUMPECTOMY WITH RADIOACTIVE SEED LOCALIZATION;  Surgeon: Alphonsa Overall, MD;  Location: [facility];  Service: General;  Laterality: Left;  Marland Kitchen Mastectomy w/ sentinel node biopsy Right 04/06/2015    Procedure: RIGHT MASTECTOMY WITH SENTINEL LYMPH NODE BIOPSY;  Surgeon: Alphonsa Overall, MD;  Location: [facility];  Service: General;  Laterality: Right;  . Breast reconstruction with placement of tissue expander and flex hd (acellular hydrated dermis) Right 04/06/2015    Procedure: RIGHT BREAST RECONSTRUCTION WITH PLACEMENT OF TISSUE EXPANDER, POSSIBLE ACELLULAR DERMIS;  Surgeon: Irene Limbo, MD;  Location: [facility];  Service: Plastics;  Laterality: Right;    FAMILY HISTORY Family History  Problem Relation Age of Onset  . Diabetes Mother   . Hypertension Mother   . Spinal muscular atrophy Mother   . Irregular heart beat Mother   . Anxiety disorder Mother   . Arthritis Mother   . Cancer Other     maternal great grandfather dx. unspecified type cancer  . Heart attack Maternal Grandmother   . Heart attack Maternal Uncle   . Cancer Cousin     paternal 1st cousin dx. with cancer that was typically a childhood cancer   the patient has little information on her father's side of the family. Her mother died of  complications of nonalcoholic cirrhosis of the age of 70. The patient had 4 brothers, 2 sisters. A maternal great-grandfather reportedly had cancer of some type, but she has no further information.  GYNECOLOGIC HISTORY:  Patient's last menstrual period was 03/09/2015 (approximate).  menarche age 47, first live birth age 32, the patient is [place] P2. She has periods fairly irregularly at present and they last about 3 days. She used Depo Provera last in 2000.   SOCIAL HISTORY:   Avalene teaches elementary school. Her husband [name] is a Customer service manager. Son avier (pronounced "X--avier") is a Environmental consultant at the [employer] in [place]. Daughter [name] 16 is at home with the patient. There are no grandchildren. The patient attends the [employer] church in [place]: not in place    HEALTH MAINTENANCE: Social History  Substance Use Topics  . Smoking status: Never Smoker   . Smokeless tobacco: Never Used  . Alcohol Use: 2.0 - 2.5 oz/week    4-5 Standard drinks or equivalent per week     Comment: wine and beer on weekends     Colonoscopy: never   PAP: February 2017  Bone density: never   Lipid panel:  No Known Allergies  Current Outpatient Prescriptions  Medication Sig Dispense Refill  . acetaminophen (TYLENOL) 325 MG tablet Take 325 mg by mouth every 6 (six) hours as needed for mild pain or moderate pain. Reported on 04/22/2015    . aspirin-sod bicarb-citric acid (ALKA-SELTZER) 325 MG TBEF tablet Take 325 mg by mouth every 8 (eight) hours as needed (reflux). Reported on 04/22/2015    . fluconazole (DIFLUCAN) 150 MG tablet Take 1 tablet (150 mg total) by mouth daily. (Patient not taking: Reported on 04/22/2015) 5 tablet 0  . methocarbamol (ROBAXIN) 500 MG tablet Take 1 tablet (500 mg total) by mouth every 8 (eight) hours as needed for muscle spasms. 30 tablet 0  . Multiple Vitamins-Minerals (MULTIVITAMIN ADULT PO) Take 1 tablet by mouth daily.     Marland Kitchen  oxyCODONE (OXY IR/ROXICODONE) 5 MG immediate release tablet Take 1-2 tablets (5-10 mg total) by mouth every 4 (four) hours as needed for moderate pain. 50 tablet 0  . sulfamethoxazole-trimethoprim (BACTRIM DS,SEPTRA DS) 800-160 MG tablet Take 1 tablet by mouth 2 (two) times daily. (Patient not taking: Reported on 04/22/2015) 12 tablet 0  . tamoxifen (NOLVADEX) 20 MG tablet Take 20 mg by mouth daily.    . valACYclovir (VALTREX) 500 MG tablet Take 500 mg by mouth daily.     No current facility-administered medications for this visit.    OBJECTIVE: middle-aged African-American woman  Who appears stated age 50 Vitals:   04/24/15 1301  BP: 112/75  Pulse: 64  Temp: 98.3 F (36.8 C)  Resp: 18     Body mass index is 33.01 kg/(m^2).    ECOG FS:1 - Symptomatic but completely ambulatory  Sclerae unicteric, EOMs intact Oropharynx clear, dentition in good repair No cervical or supraclavicular adenopathy Lungs no rales or rhonchi Heart regular rate and rhythm Abd soft, nontender, positive bowel sounds MSK no focal spinal tenderness, no upper extremity lymphedema Neuro: nonfocal, well oriented, appropriate affect Breasts: the right breast is still bandaged over and was not uncovered.there is a drain in place with approximately 25 mL of serosanguineous liquid in the bulb The incision in the left breast is healing nicely, without dehiscence, swelling, or erythema both axillae are benign.  LAB RESULTS:  CMP     Component Value Date/Time   NA 142 03/30/2015 0823   NA 138 02/25/2015 0837   K 4.2 03/30/2015 0823   K 4.5 02/25/2015 0837   CL 111 03/30/2015 0823   CO2 21* 03/30/2015 0823   CO2 24 02/25/2015 0837   GLUCOSE 103* 03/30/2015 0823   GLUCOSE 108 02/25/2015 0837   BUN 8 03/30/2015 0823   BUN 7.3 02/25/2015 0837   CREATININE 0.72 03/30/2015 0823   CREATININE 0.8 02/25/2015 0837   CREATININE 0.66 03/14/2014 1354   CALCIUM 9.0 03/30/2015 0823   CALCIUM 8.8 02/25/2015 0837   PROT  6.7 02/25/2015 0837   PROT 6.8 03/14/2014 1354   ALBUMIN 3.6 02/25/2015 0837   ALBUMIN 3.9 03/14/2014 1354   AST 18 02/25/2015 0837   AST 22 03/14/2014 1354   ALT 25 02/25/2015 0837   ALT 19 03/14/2014 1354   ALKPHOS 64 02/25/2015 0837   ALKPHOS 51 03/14/2014 1354   BILITOT 0.87 02/25/2015 0837   BILITOT 0.4 03/14/2014 1354   GFRNONAA >60 03/30/2015 0823   GFRNONAA >89 03/14/2014 1354   GFRAA >60 03/30/2015 0823   GFRAA >89 03/14/2014 1354    INo results found for: SPEP, UPEP  Lab Results  Component Value Date   WBC 5.2 04/24/2015  NEUTROABS 2.1 04/24/2015   HGB 10.8* 04/24/2015   HCT 33.6* 04/24/2015   MCV 93.8 04/24/2015   PLT 338 04/24/2015      Chemistry      Component Value Date/Time   NA 142 03/30/2015 0823   NA 138 02/25/2015 0837   K 4.2 03/30/2015 0823   K 4.5 02/25/2015 0837   CL 111 03/30/2015 0823   CO2 21* 03/30/2015 0823   CO2 24 02/25/2015 0837   BUN 8 03/30/2015 0823   BUN 7.3 02/25/2015 0837   CREATININE 0.72 03/30/2015 0823   CREATININE 0.8 02/25/2015 0837   CREATININE 0.66 03/14/2014 1354      Component Value Date/Time   CALCIUM 9.0 03/30/2015 0823   CALCIUM 8.8 02/25/2015 0837   ALKPHOS 64 02/25/2015 0837   ALKPHOS 51 03/14/2014 1354   AST 18 02/25/2015 0837   AST 22 03/14/2014 1354   ALT 25 02/25/2015 0837   ALT 19 03/14/2014 1354   BILITOT 0.87 02/25/2015 0837   BILITOT 0.4 03/14/2014 1354       No results found for: LABCA2  No components found for: LABCA125  No results for input(s): INR in the last 168 hours.  Urinalysis    Component Value Date/Time   BILIRUBINUR neg 05/03/2012 0954   PROTEINUR neg 05/03/2012 0954   UROBILINOGEN 0.2 05/03/2012 0954   NITRITE positive 05/03/2012 0954   LEUKOCYTESUR Trace 05/03/2012 0954      ELIGIBLE FOR AVAILABLE RESEARCH PROTOCOL: no  STUDIES: Nm Sentinel Node Inj-no Rpt (breast)  04/06/2015  CLINICAL DATA: right breast cancer Sulfur colloid was injected intradermally by the  nuclear medicine technologist for breast cancer sentinel node localization.   Mm Breast Surgical Specimen  04/06/2015  CLINICAL DATA:  Wire localization of the left breast was performed today. The patient has a recent diagnosis of atypical ductal hyperplasia of the left breast. The biopsy site was marked with a cylindrical biopsy clip. An attempt at radioactive seed localization of the left breast on 04/03/2015 was unsuccessful due to migration of the radioactive seed. EXAM: SPECIMEN RADIOGRAPH OF THE LEFT BREAST COMPARISON:  Previous exam(s). FINDINGS: Status post excision of the left breast. The wire tip and biopsy marker clip are present and are marked for pathology. A separate image was sent, showing that the radioactive seed had been successfully removed from the subdermal left breast. IMPRESSION: Specimen radiograph of the left breast. Electronically Signed   By: Curlene Dolphin M.D.   On: 04/06/2015 12:51   Mm Lt Radioactive Seed Loc Mammo Guide  04/03/2015  CLINICAL DATA:  Left breast atypical ductal hyperplasia for seed placement prior to surgery. EXAM: MAMMOGRAPHIC GUIDED RADIOACTIVE SEED LOCALIZATION OF THE LEFT BREAST COMPARISON:  Previous exam(s). FINDINGS: Patient presents for radioactive seed localization prior to left breast surgery. I met with the patient and we discussed the procedure of seed localization including benefits and alternatives. We discussed the high likelihood of a successful procedure. We discussed the risks of the procedure including infection, bleeding, tissue injury and further surgery. We discussed the low dose of radioactivity involved in the procedure. Informed, written consent was given. The usual time-out protocol was performed immediately prior to the procedure. Using mammographic guidance, sterile technique, 1% lidocaine and an I-125 radioactive seed, the anterior is cylinder shaped biopsy clip was attempted to be localized via the cranial approach. The seed migrated to  the subdermal region. I discussed the case with Dr. Lucia Gaskins. The patient will return Monday April 3rd prior to surgery for  a needle localization of the anterior is cylinder shaped biopsy clip via the cranial approach. Follow-up survey of the patient confirms presence of the radioactive seed. Order number of I-125 seed:  [id number]. Total activity:  2.70 millicurie  Reference Date: March 27, 2015 The patient tolerated the procedure well and was released from the [facility]. She was given instructions regarding seed removal. IMPRESSION: Radioactive seed localization left breast. No apparent complications. Electronically Signed   By: Abelardo Diesel M.D.   On: 04/03/2015 15:11   Mm Lt Plc Breast Loc Dev   1st Lesion  Inc Mammo Guide  04/06/2015  CLINICAL DATA:  Atypical ductal hyperplasia of the left breast. The patient presents for wire localization of the biopsy site denoted by a metallic cylindrical biopsy clip in the anterior third of the upper outer quadrant. Radioactive seed localization of this site was attempted on 04/03/2015, but the radioactive seed migrated to a subdermal location, as previously dictated. The patient has RIGHT breast cancer and is scheduled for a right mastectomy today. EXAM: NEEDLE LOCALIZATION OF THE LEFT BREAST WITH MAMMO GUIDANCE COMPARISON:  Previous exams. FINDINGS: Patient presents for needle localization prior to excisional biopsy of the left breast. I met with the patient and we discussed the procedure of needle localization including benefits and alternatives. We discussed the high likelihood of a successful procedure. We discussed the risks of the procedure, including infection, bleeding, tissue injury, and further surgery. Informed, written consent was given. The usual time-out protocol was performed immediately prior to the procedure. Using mammographic guidance, sterile technique, 1% lidocaine and a 7 cm modified Kopans needle, a cylindrical biopsy clip in the anterior third  upper outer quadrant was localized using superior to inferior approach. The radioactive seed is in a subdermal location, very close to the skin entry site of the wire placed today. The images were marked for Dr. Lucia Gaskins. IMPRESSION: Needle localization left breast. No apparent complications. Electronically Signed   By: Curlene Dolphin M.D.   On: 04/06/2015 09:36    ASSESSMENT: 50 y.o. [place] woman s/p biopsy 02/17/2015 of 2 places in her right breast, separated by 10 cm, both showing invasive ductal carcinoma, clinically T2 NX, stage II, estrogen receptor and progesterone receptor strongly positive, HER-2/neu nonamplified, with an MIB-1 of 25%  (1) genetics testing 03/06/2015 through theBreast/Ovarian Cancer Panel offered byGeneDx Laboratoriesfound no deleterious mutations in ATM, BARD1, BRCA1, BRCA2, BRIP1, CDH1, CHEK2, FANCC, MLH1, MSH2, MSH6, NBN, PALB2, PMS2, PTEN, RAD51C, RAD51D, TP53, and XRCC2. This panel also includes deletion/duplication analysis (without sequencing) for one gene, EPCAM.   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    (2) Oncotype DX score of 5 predicts an outside the breast risk of recurrence within 10 years of 5% if the patient's only systemic therapy is tamoxifen for 5 years. It also predicts no benefit from chemotherapy  (3) left breast biopsy 03/10/2015 shows an area of atypical ductal hyperplasia  (a) left lumpectomy for 03/23/2015 showed only the usual ductal hyperplasia, no atypia or tumor  (4) right mastectomy with sentinel lymph node sampling 04/06/2015 shows an mpT2 pN0, stage IA invasive ductal carcinoma, grade 2 with negative margins repeat HER-2 (2) again negative  (5) adjuvant radiation to be considered as appropriate  (6) tamoxifen started neoadjuvantly on 02/25/2015  PLAN: Jessamine  Is still recovering from her surgery but generally she is doing well. She will have less  pain of course as  soon as  We can remove the remaining drain. She plans to have her permanent, saline implant placed sometime this summer. Looking ahead she is hoping to be "healed" by the times goal starts in August.  We discussed her pathology report in detail. She understands she ended up having a stage I tumor (right at the border with stage II). There were 2 morphologically very similar masses and both were HER-2 negative. We discussed margins which were negative and also her lymph node sampling, and the difference between sentinel and non-sentinel lymph node  From the point of view of local therapy, she is done. Specifically she will not need adjuvant radiation. She is very pleased about that.  From the point of view of systemic treatment, of course she is not a candidate for anti-HER-2 immunotherapy. We reviewed the Oncotype results, which show she would receive essentially no benefit from adjuvant chemotherapy and so that is not planned.  She has been on tamoxifen all this time, and continues on it with very good tolerance. I see no reason why she should not continue that for a total of 10 years and that is my recommendation. We have previously discussed the possible toxicities, side effects and complications of this agent and we reviewed that today.  Even though things have gone very well, she is expressing some grief regarding losing her breast and she has not yet gotten herself to look even at the left breast, much less the right. I suggested finding. Normal would be helpful and she did take a pamphlet on that. She is benefiting from physical therapy.  She's, see me again in about 3 months. She knows to call for any problems that may develop before that.  Chauncey Cruel, MD   04/24/2015 1:16 PM Medical Oncology and Hematology [facility] [address] [place], [place] [address] Tel. [phone]    Fax. [phone]

## 2015-04-27 ENCOUNTER — Telehealth: Payer: Self-pay | Admitting: *Deleted

## 2015-04-27 DIAGNOSIS — C50211 Malignant neoplasm of upper-inner quadrant of right female breast: Secondary | ICD-10-CM

## 2015-04-27 NOTE — Telephone Encounter (Signed)
  Oncology Nurse Navigator Documentation    Navigator Encounter Type: Telephone (04/27/15 1400)       Surgery Date: 04/06/15 (04/27/15 1400) Treatment Initiated Date: 04/06/15 (04/27/15 1400)     Barriers/Navigation Needs: No barriers at this time;No Questions;No Needs (04/27/15 1400)   Interventions: Referrals (04/27/15 1400) Referrals: Survivorship (04/27/15 1400)          Acuity: Level 1 (04/27/15 1400)         Time Spent with Patient: 15 (04/27/15 1400)

## 2015-04-28 ENCOUNTER — Telehealth: Payer: Self-pay | Admitting: Oncology

## 2015-04-28 NOTE — Telephone Encounter (Signed)
Spoke with patient to confirm May 9 appt and 6/6 appt date/times

## 2015-04-29 ENCOUNTER — Ambulatory Visit: Payer: BC Managed Care – PPO

## 2015-04-30 ENCOUNTER — Encounter: Payer: Self-pay | Admitting: Physical Therapy

## 2015-04-30 ENCOUNTER — Ambulatory Visit: Payer: BC Managed Care – PPO | Admitting: Physical Therapy

## 2015-04-30 DIAGNOSIS — R293 Abnormal posture: Secondary | ICD-10-CM

## 2015-04-30 DIAGNOSIS — M25511 Pain in right shoulder: Secondary | ICD-10-CM

## 2015-04-30 DIAGNOSIS — M25611 Stiffness of right shoulder, not elsewhere classified: Secondary | ICD-10-CM

## 2015-04-30 DIAGNOSIS — M6281 Muscle weakness (generalized): Secondary | ICD-10-CM

## 2015-04-30 NOTE — Therapy (Signed)
[place], [place], [address] Phone: [phone]   Fax:  [phone]  Physical Therapy Treatment  Patient Details  Name: Brooke [name] MRN: [id number] Date of Birth: August 03, 1965 Referring Provider: Thimmappa   Encounter Date: 04/30/2015      PT End of Session - 04/30/15 1223    Visit Number 2   Number of Visits 9   Date for PT Re-Evaluation 05/22/15   PT Start Time 0845   PT Stop Time 0929   PT Time Calculation (min) 44 min   Activity Tolerance Patient tolerated treatment well;Patient limited by pain   Behavior During Therapy [facility] for tasks assessed/performed      Past Medical History  Diagnosis Date  . Anemia   . Depression, postpartum   . Yeast infection   . Fatigue   . Chronic headaches   . Anxiety   . Breast cancer of upper-inner quadrant of right female breast ([place]) 02/18/2015  . Hot flashes     Past Surgical History  Procedure Laterality Date  . Gastric bypass    . Tubal ligation    . Cholecystectomy    . Mastectomy w/ sentinel node biopsy Right   . Breast lumpectomy with radioactive seed localization Left 04/06/2015  . Breast lumpectomy with radioactive seed localization Left 04/06/2015    Procedure: LEFT BREAST LUMPECTOMY WITH RADIOACTIVE SEED LOCALIZATION;  Surgeon: Alphonsa Overall, MD;  Location: [place];  Service: General;  Laterality: Left;  Marland Kitchen Mastectomy w/ sentinel node biopsy Right 04/06/2015    Procedure: RIGHT MASTECTOMY WITH SENTINEL LYMPH NODE BIOPSY;  Surgeon: Alphonsa Overall, MD;  Location: [place];  Service: General;  Laterality: Right;  . Breast reconstruction with placement of tissue expander and flex hd (acellular hydrated dermis) Right 04/06/2015    Procedure: RIGHT BREAST RECONSTRUCTION WITH PLACEMENT OF TISSUE EXPANDER, POSSIBLE ACELLULAR DERMIS;  Surgeon: Irene Limbo, MD;  Location: [place];  Service: Plastics;  Laterality: Right;    There were no vitals filed for this  visit.      Subjective Assessment - 04/30/15 0853    Subjective Pain 5/10 on the right chest.  I feel kind of funny today.  Mostly weak.  I had bloodwork done last week and I haven't gotten the results yet. I got my drain out yesterday.   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  She has 2 areas which are both in the upper inner quadrant measuring 10 cm apart.  One area is a 2 cm mass and the other is a area of calcs.  Both are ER/PR positive and HER2 negative with a Ki67 of 25%.   On 04/06/3015 she had right masecectomy  with (27?)lymph nodes removed with immedicate expander placement and left lumpectomy with no nodes  removed. She does not have to have chemo or radiation . She currently has one drain implace and hopefully will have that removed next  week.  Past hisory includes a gastric bypass surgery.    Patient Stated Goals to get this soreness out of my arm and make sure I don't get lymphedema    Currently in Pain? Yes   Pain Score 5    Pain Location Shoulder   Pain Orientation Right   Pain Descriptors / Indicators Sore   Pain Type Surgical pain   Pain Onset 1 to 4 weeks ago  [facility] Adult PT Treatment/Exercise - 04/30/15 0001    Shoulder Exercises: Supine   Other Supine Exercises dowel rod flexion   x2; stopped due to pain   Shoulder Exercises: Pulleys   Flexion 2 minutes   Flexion Limitations Limited on right but good ROM on left   ABduction 2 minutes   Shoulder Exercises: Therapy Ball   Flexion 10 reps   Flexion Limitations Limited more by height and hitting ceiling with ball than by ROM; some tightness reported   Manual Therapy   Manual Therapy Passive ROM;Neural Stretch   Passive ROM PROM right shoulder and left shoulder in supine to pt tolerance; some resistance on right side due to pain   Neural Stretch Passive neural stretch to bilateral UE in supine to pt tolerance                    Short Term Clinic Goals - 04/22/15 1402    CC Short Term Goal  #1   Title short term goals= long term goals           Breast Clinic Goals - 02/25/15 0923    Patient will be able to verbalize understanding of pertinent lymphedema risk reduction practices relevant to her diagnosis specifically related to skin care.   Time 1   Period Days   Status Achieved   Patient will be able to return demonstrate and/or verbalize understanding of the post-op home exercise program related to regaining shoulder range of motion.   Time 1   Period Days   Status Achieved   Patient will be able to verbalize understanding of the importance of attending the postoperative After Breast Cancer Class for further lymphedema risk reduction education and therapeutic exercise.   Time 1   Period Days   Status Achieved          Long Term Clinic Goals - 04/22/15 1403    CC Long Term Goal  #1   Title Patient with verbalize an understanding of lymphedema risk reduction precautions   Time 4   Period Weeks   Status New   CC Long Term Goal  #2   Title Patient will be independent in a  home exercise program for shoulder range of motion and strength    Time 4   Period Weeks   Status New   CC Long Term Goal  #3   Title Patient will report a decrease in pain by 75% so they can perform daily activities with greater ease    Time 4   Period Weeks   Status New   CC Long Term Goal  #4   Title Patient will decrease the DASH score to <  40   to demonstrate increased functional use of upper extremity   Baseline 56.82 on 04/22/2015   Time 4   Period Weeks   Status New            Plan - 04/30/15 1223    Clinical Impression Statement Pt is somewhat limited by pain in her right chest.  She reported she hasn't taken pain medication for the past 2 days but said she thinks she should take it.  Encouraged pt that if she decides to take it, consider timing it with physical therapy so she can  tolerate exercises with less discomfort.  ROM improved by end of session but still very limited on the right, which is normal and to be expected for having drain removed yesterday.   Rehab Potential Excellent     Clinical Impairments Affecting Rehab Potential axillary node dissection on the right    PT Frequency 2x / week   PT Duration 4 weeks   PT Treatment/Interventions ADLs/Self Care Home Management;DME Instruction;Patient/family education;Passive range of motion;Therapeutic activities;Therapeutic exercise;Manual techniques;Taping;Manual lymph drainage   PT Next Visit Plan Progress HEP and continue PROM and AAROM exercises; pt plans to attend ABC class on 05/04/15.   Consulted and Agree with Plan of Care Patient      Patient will benefit from skilled therapeutic intervention in order to improve the following deficits and impairments:  Decreased strength, Pain, Decreased knowledge of precautions, Impaired UE functional use, Decreased range of motion, Increased edema, Postural dysfunction  Visit Diagnosis: Stiffness of right shoulder, not elsewhere classified  Abnormal posture  Pain in right shoulder  Muscle weakness (generalized)     Problem List Patient Active Problem List   Diagnosis Date Noted  . Genetic testing 03/09/2015  . Breast cancer of upper-inner quadrant of right female breast ([place]) 02/18/2015  . Depression, postpartum   . Yeast infection   . Fatigue   . Chronic headaches    Annia Friendly, [place] 04/30/2015 12:27 PM  [place], [place], [address] Phone: [phone]   Fax:  [phone]  Name: Brooke [name] MRN: [id number] Date of Birth: May 09, 1965

## 2015-05-05 ENCOUNTER — Ambulatory Visit: Payer: BC Managed Care – PPO | Attending: Plastic Surgery | Admitting: Physical Therapy

## 2015-05-05 DIAGNOSIS — M6281 Muscle weakness (generalized): Secondary | ICD-10-CM | POA: Insufficient documentation

## 2015-05-05 DIAGNOSIS — M25612 Stiffness of left shoulder, not elsewhere classified: Secondary | ICD-10-CM | POA: Insufficient documentation

## 2015-05-05 DIAGNOSIS — M25611 Stiffness of right shoulder, not elsewhere classified: Secondary | ICD-10-CM

## 2015-05-05 DIAGNOSIS — R293 Abnormal posture: Secondary | ICD-10-CM | POA: Insufficient documentation

## 2015-05-05 DIAGNOSIS — M25511 Pain in right shoulder: Secondary | ICD-10-CM | POA: Diagnosis present

## 2015-05-05 NOTE — Therapy (Signed)
[place], [place], [address] Phone: [phone]   Fax:  [phone]  Physical Therapy Treatment  Patient Details  Name: Brooke [name] MRN: [id number] Date of Birth: [birth year] Referring Provider: Thimmappa   Encounter Date: 05/05/2015      PT End of Session - 05/05/15 2041    Visit Number 3   Number of Visits 9   Date for PT Re-Evaluation 05/22/15   PT Start Time 3832   PT Stop Time 1516   PT Time Calculation (min) 42 min   Activity Tolerance Patient tolerated treatment well   Behavior During Therapy [facility] for tasks assessed/performed      Past Medical History  Diagnosis Date  . Anemia   . Depression, postpartum   . Yeast infection   . Fatigue   . Chronic headaches   . Anxiety   . Breast cancer of upper-inner quadrant of right female breast ([place]) 02/18/2015  . Hot flashes     Past Surgical History  Procedure Laterality Date  . Gastric bypass    . Tubal ligation    . Cholecystectomy    . Mastectomy w/ sentinel node biopsy Right   . Breast lumpectomy with radioactive seed localization Left 04/06/2015  . Breast lumpectomy with radioactive seed localization Left 04/06/2015    Procedure: LEFT BREAST LUMPECTOMY WITH RADIOACTIVE SEED LOCALIZATION;  Surgeon: Alphonsa Overall, MD;  Location: [place];  Service: General;  Laterality: Left;  Marland Kitchen Mastectomy w/ sentinel node biopsy Right 04/06/2015    Procedure: RIGHT MASTECTOMY WITH SENTINEL LYMPH NODE BIOPSY;  Surgeon: Alphonsa Overall, MD;  Location: [place];  Service: General;  Laterality: Right;  . Breast reconstruction with placement of tissue expander and flex hd (acellular hydrated dermis) Right 04/06/2015    Procedure: RIGHT BREAST RECONSTRUCTION WITH PLACEMENT OF TISSUE EXPANDER, POSSIBLE ACELLULAR DERMIS;  Surgeon: Irene Limbo, MD;  Location: [place];  Service: Plastics;  Laterality: Right;    There were no vitals filed for this visit.      Subjective  Assessment - 05/05/15 1435    Subjective Reports some discomfort at left mid-back and at right breast.  Also feels tire, just different from what she normally does.  Feels a little sick after she eats.   Currently in Pain? Yes   Pain Score 5    Pain Location Breast   Pain Orientation Right   Pain Descriptors / Indicators Aching   Pain Relieving Factors something soft up against the breast                         [facility] Adult PT Treatment/Exercise - 05/05/15 0001    Shoulder Exercises: Seated   Other Seated Exercises At end of session, backward shoulder rolls and shoulder shrugs up and back followed by relaxing shoulders down x 4 each   Manual Therapy   Manual Therapy Myofascial release   Myofascial Release right UE myofascial pulling with movement into shoulder abduction through partial range   Scapular Mobilization right scapula in left sidelying for protraction and depression at various angles  pt. reported she felt better after this   Passive ROM In supine to left and right shoulder in all planes.                   Short Term Clinic Goals - 04/22/15 1402    CC Short Term Goal  #1   Title short term goals= long term goals  Breast Clinic Goals - 02/25/15 8366    Patient will be able to verbalize understanding of pertinent lymphedema risk reduction practices relevant to her diagnosis specifically related to skin care.   Time 1   Period Days   Status Achieved   Patient will be able to return demonstrate and/or verbalize understanding of the post-op home exercise program related to regaining shoulder range of motion.   Time 1   Period Days   Status Achieved   Patient will be able to verbalize understanding of the importance of attending the postoperative After Breast Cancer Class for further lymphedema risk reduction education and therapeutic exercise.   Time 1   Period Days   Status Achieved          Long Term Clinic Goals - 05/05/15  1439    CC Long Term Goal  #1   Title Patient with verbalize an understanding of lymphedema risk reduction precautions   Status Achieved   CC Long Term Goal  #2   Title Patient will be independent in a  home exercise program for shoulder range of motion and strength    Status Partially Met   CC Long Term Goal  #3   Title Patient will report a decrease in pain by 75% so they can perform daily activities with greater ease    Status On-going   CC Long Term Goal  #4   Title Patient will decrease the DASH score to <  40   to demonstrate increased functional use of upper extremity   Status On-going            Plan - 05/05/15 2041    Clinical Impression Statement Patient was able to relax moderately for bilat. shoulder PROM; distraction may have helped.  Still with limited ROM of right> left shoulders, but tolerating therapy.  She attended ABC class this week so got some additional stretches to do there, and met her goal of being knowledgeable about lymphedema risk reduciton.   Rehab Potential Excellent   Clinical Impairments Affecting Rehab Potential axillary node dissection on the right    PT Frequency 2x / week   PT Duration 4 weeks   PT Treatment/Interventions Passive range of motion;Manual techniques;Therapeutic exercise   PT Next Visit Plan Remeasure next; continue P/AA/AROM both shoulders.   PT Home Exercise Plan shoulder stretches, to include ones learned at [facility], at clinic, or at [facility] class (her preference)   Consulted and Agree with Plan of Care Patient      Patient will benefit from skilled therapeutic intervention in order to improve the following deficits and impairments:  Decreased strength, Pain, Decreased knowledge of precautions, Impaired UE functional use, Decreased range of motion, Increased edema, Postural dysfunction  Visit Diagnosis: Stiffness of right shoulder, not elsewhere classified  Stiffness of left shoulder, not elsewhere classified  Pain in right  shoulder     Problem List Patient Active Problem List   Diagnosis Date Noted  . Genetic testing 03/09/2015  . Breast cancer of upper-inner quadrant of right female breast ([place]) 02/18/2015  . Depression, postpartum   . Yeast infection   . Fatigue   . Chronic headaches     Brooke [name] 05/05/2015, 8:47 PM  [place], [place], [address] Phone: [phone]   Fax:  [phone]  Name: Brooke [name] MRN: [id number] Date of Birth: 1965/11/20    Serafina Royals, PT 05/05/2015 8:47 PM

## 2015-05-07 ENCOUNTER — Ambulatory Visit: Payer: BC Managed Care – PPO

## 2015-05-07 DIAGNOSIS — M25511 Pain in right shoulder: Secondary | ICD-10-CM

## 2015-05-07 DIAGNOSIS — M25612 Stiffness of left shoulder, not elsewhere classified: Secondary | ICD-10-CM

## 2015-05-07 DIAGNOSIS — M25611 Stiffness of right shoulder, not elsewhere classified: Secondary | ICD-10-CM

## 2015-05-07 NOTE — Therapy (Signed)
[place], [place], [address] Phone: [phone]   Fax:  [phone]  Physical Therapy Treatment  Patient Details  Name: Brooke [name] MRN: [id number] Date of Birth: 12/11/1965 Referring Provider: Thimmappa   Encounter Date: 05/07/2015      PT End of Session - 05/07/15 0849    Visit Number 4   Number of Visits 9   Date for PT Re-Evaluation 05/22/15   PT Start Time 0801   PT Stop Time 0848   PT Time Calculation (min) 47 min   Activity Tolerance Patient tolerated treatment well   Behavior During Therapy [facility] for tasks assessed/performed      Past Medical History  Diagnosis Date  . Anemia   . Depression, postpartum   . Yeast infection   . Fatigue   . Chronic headaches   . Anxiety   . Breast cancer of upper-inner quadrant of right female breast ([place]) 02/18/2015  . Hot flashes     Past Surgical History  Procedure Laterality Date  . Gastric bypass    . Tubal ligation    . Cholecystectomy    . Mastectomy w/ sentinel node biopsy Right   . Breast lumpectomy with radioactive seed localization Left 04/06/2015  . Breast lumpectomy with radioactive seed localization Left 04/06/2015    Procedure: LEFT BREAST LUMPECTOMY WITH RADIOACTIVE SEED LOCALIZATION;  Surgeon: Alphonsa Overall, MD;  Location: [place];  Service: General;  Laterality: Left;  Marland Kitchen Mastectomy w/ sentinel node biopsy Right 04/06/2015    Procedure: RIGHT MASTECTOMY WITH SENTINEL LYMPH NODE BIOPSY;  Surgeon: Alphonsa Overall, MD;  Location: [place];  Service: General;  Laterality: Right;  . Breast reconstruction with placement of tissue expander and flex hd (acellular hydrated dermis) Right 04/06/2015    Procedure: RIGHT BREAST RECONSTRUCTION WITH PLACEMENT OF TISSUE EXPANDER, POSSIBLE ACELLULAR DERMIS;  Surgeon: Irene Limbo, MD;  Location: [place];  Service: Plastics;  Laterality: Right;    There were no vitals filed for this visit.      Subjective  Assessment - 05/07/15 0803    Subjective I'm feeling pretty good this morning. The massage she did at my Rt side really helped last time. I have a check up with Dr. Iran Planas today.    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  She has 2 areas which are both in the upper inner quadrant measuring 10 cm apart.  One area is a 2 cm mass and the other is a area of calcs.  Both are ER/PR positive and HER2 negative with a Ki67 of 25%.   On 04/06/3015 she had right masecectomy  with (27?)lymph nodes removed with immedicate expander placement and left lumpectomy with no nodes  removed. She does not have to have chemo or radiation . She currently has one drain implace and hopefully will have that removed next  week.  Past hisory includes a gastric bypass surgery.    Patient Stated Goals to get this soreness out of my arm and make sure I don't get lymphedema    Currently in Pain? No/denies            [facility] PT Assessment - 05/07/15 0001    AROM   Right Shoulder Flexion 161 Degrees   Right Shoulder ABduction 150 Degrees   Right Shoulder Internal Rotation 72 Degrees   Right Shoulder External Rotation 95 Degrees   Left Shoulder Flexion 160 Degrees   Left Shoulder ABduction 147 Degrees  [facility] Adult PT Treatment/Exercise - 05/07/15 0001    Shoulder Exercises: Pulleys   Flexion 2 minutes   Flexion Limitations No limitations today   Shoulder Exercises: Therapy Ball   Flexion 10 reps   Manual Therapy   Myofascial Release right UE myofascial pulling with movement into shoulder abduction through partial range   Scapular Mobilization right scapula in left sidelying for protraction and depression at various angles   Passive ROM In supine to left and right shoulder in all planes.                   Short Term Clinic Goals - 04/22/15 1402    CC Short Term Goal  #1   Title short term goals= long term goals            Breast Clinic Goals - 02/25/15 [phone]    Patient will be able to verbalize understanding of pertinent lymphedema risk reduction practices relevant to her diagnosis specifically related to skin care.   Time 1   Period Days   Status Achieved   Patient will be able to return demonstrate and/or verbalize understanding of the post-op home exercise program related to regaining shoulder range of motion.   Time 1   Period Days   Status Achieved   Patient will be able to verbalize understanding of the importance of attending the postoperative After Breast Cancer Class for further lymphedema risk reduction education and therapeutic exercise.   Time 1   Period Days   Status Achieved          Long Term Clinic Goals - 05/07/15 2620    CC Long Term Goal  #1   Title Patient with verbalize an understanding of lymphedema risk reduction precautions   Status Achieved   CC Long Term Goal  #2   Title Patient will be independent in a  home exercise program for shoulder range of motion and strength    Status Partially Met   CC Long Term Goal  #3   Title Patient will report a decrease in pain by 75% so they can perform daily activities with greater ease    Status On-going   CC Long Term Goal  #4   Title Patient will decrease the DASH score to <  40   to demonstrate increased functional use of upper extremity   Status On-going            Plan - 05/07/15 0850    Clinical Impression Statement Pt did well with relaxing with PROM today, though did require occassional reminders to do so. Her AROM on her Rt UE have improved greatly and pt reports overall feeling better and looser after her sessions. Pt is progressing well towards goals.   Rehab Potential Excellent   Clinical Impairments Affecting Rehab Potential axillary node dissection on the right    PT Frequency 2x / week   PT Duration 4 weeks   PT Treatment/Interventions Passive range of motion;Manual techniques;Therapeutic exercise   PT Next Visit  Plan Continue P/AA/AROM both shoulders. Progress HEP prn.   Consulted and Agree with Plan of Care Patient      Patient will benefit from skilled therapeutic intervention in order to improve the following deficits and impairments:  Decreased strength, Pain, Decreased knowledge of precautions, Impaired UE functional use, Decreased range of motion, Increased edema, Postural dysfunction  Visit Diagnosis: Stiffness of right shoulder, not elsewhere classified  Stiffness of left shoulder, not elsewhere classified  Pain in right shoulder  Problem List Patient Active Problem List   Diagnosis Date Noted  . Genetic testing 03/09/2015  . Breast cancer of upper-inner quadrant of right female breast ([place]) 02/18/2015  . Depression, postpartum   . Yeast infection   . Fatigue   . Chronic headaches     Otelia Limes, PTA 05/07/2015, 8:52 AM  [place], [place], [address] Phone: [phone]   Fax:  [phone]  Name: Brooke [name] MRN: [id number] Date of Birth: January 17, 1965

## 2015-05-12 ENCOUNTER — Ambulatory Visit: Payer: BC Managed Care – PPO | Admitting: Physical Therapy

## 2015-05-12 ENCOUNTER — Encounter: Payer: BC Managed Care – PPO | Admitting: Nutrition

## 2015-05-14 ENCOUNTER — Ambulatory Visit: Payer: BC Managed Care – PPO

## 2015-05-14 DIAGNOSIS — R293 Abnormal posture: Secondary | ICD-10-CM

## 2015-05-14 DIAGNOSIS — M25612 Stiffness of left shoulder, not elsewhere classified: Secondary | ICD-10-CM

## 2015-05-14 DIAGNOSIS — M6281 Muscle weakness (generalized): Secondary | ICD-10-CM

## 2015-05-14 DIAGNOSIS — M25611 Stiffness of right shoulder, not elsewhere classified: Secondary | ICD-10-CM

## 2015-05-14 DIAGNOSIS — M25511 Pain in right shoulder: Secondary | ICD-10-CM

## 2015-05-14 NOTE — Therapy (Signed)
[place], [place], [address] Phone: [phone]   Fax:  [phone]  Physical Therapy Treatment  Patient Details  Name: Brooke [name] MRN: [id number] Date of Birth: 31-Jan-1965 Referring Provider: Thimmappa   Encounter Date: 05/14/2015      PT End of Session - 05/14/15 1201    Visit Number 5   Number of Visits 9   Date for PT Re-Evaluation 05/22/15   PT Start Time 1106   PT Stop Time 1153   PT Time Calculation (min) 47 min   Activity Tolerance Patient tolerated treatment well   Behavior During Therapy [facility] for tasks assessed/performed      Past Medical History  Diagnosis Date  . Anemia   . Depression, postpartum   . Yeast infection   . Fatigue   . Chronic headaches   . Anxiety   . Breast cancer of upper-inner quadrant of right female breast ([place]) 02/18/2015  . Hot flashes     Past Surgical History  Procedure Laterality Date  . Gastric bypass    . Tubal ligation    . Cholecystectomy    . Mastectomy w/ sentinel node biopsy Right   . Breast lumpectomy with radioactive seed localization Left 04/06/2015  . Breast lumpectomy with radioactive seed localization Left 04/06/2015    Procedure: LEFT BREAST LUMPECTOMY WITH RADIOACTIVE SEED LOCALIZATION;  Surgeon: Alphonsa Overall, MD;  Location: [facility];  Service: General;  Laterality: Left;  Marland Kitchen Mastectomy w/ sentinel node biopsy Right 04/06/2015    Procedure: RIGHT MASTECTOMY WITH SENTINEL LYMPH NODE BIOPSY;  Surgeon: Alphonsa Overall, MD;  Location: [facility];  Service: General;  Laterality: Right;  . Breast reconstruction with placement of tissue expander and flex hd (acellular hydrated dermis) Right 04/06/2015    Procedure: RIGHT BREAST RECONSTRUCTION WITH PLACEMENT OF TISSUE EXPANDER, POSSIBLE ACELLULAR DERMIS;  Surgeon: Irene Limbo, MD;  Location: [facility];  Service: Plastics;  Laterality: Right;    There were no vitals filed for this visit.      Subjective  Assessment - 05/14/15 1110    Subjective I wasn't feeling great Tuesday, had to cancel my appt. My chest was just really tight. 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  She has 2 areas which are both in the upper inner quadrant measuring 10 cm apart.  One area is a 2 cm mass and the other is a area of calcs.  Both are ER/PR positive and HER2 negative with a Ki67 of 25%.   On 04/06/3015 she had right masecectomy  with (27?)lymph nodes removed with immedicate expander placement and left lumpectomy with no nodes  removed. She does not have to have chemo or radiation . She currently has one drain implace and hopefully will have that removed next  week.  Past hisory includes a gastric bypass surgery.    Patient Stated Goals to get this soreness out of my arm and make sure I don't get lymphedema    Currently in Pain? No/denies            [facility] PT Assessment - 05/14/15 0001    AROM   Right Shoulder Flexion 162 Degrees  Some pain at lateral scapula   Right Shoulder ABduction 153 Degrees   Right Shoulder Internal Rotation 76 Degrees  [facility] Adult PT Treatment/Exercise - 05/14/15 0001    Shoulder Exercises: Standing   Flexion Strengthening;Both;10 reps;Weights  To shoulder height   Shoulder Flexion Weight (lbs) 2   ABduction Strengthening;Both;10 reps;Weights  To shoulder height   Shoulder ABduction Weight (lbs) 2   Other Standing Exercises Bil Scaption to sholder height 2 lbs, x 10 each   Shoulder Exercises: Pulleys   Flexion 2 minutes   ABduction 2 minutes   Shoulder Exercises: ROM/Strengthening   Other ROM/Strengthening Exercises Finger Ladder for Rt UE flexion and abduction 10 each   Manual Therapy   Myofascial Release right UE myofascial pulling with movement into shoulder abduction through partial range    Scapular Mobilization right scapula in left sidelying for protraction and depression at various angles   Passive ROM In supine to right shoulder in all planes to pts tolerance.                   Short Term Clinic Goals - 04/22/15 1402    CC Short Term Goal  #1   Title short term goals= long term goals           Breast Clinic Goals - 02/25/15 [phone]    Patient will be able to verbalize understanding of pertinent lymphedema risk reduction practices relevant to her diagnosis specifically related to skin care.   Time 1   Period Days   Status Achieved   Patient will be able to return demonstrate and/or verbalize understanding of the post-op home exercise program related to regaining shoulder range of motion.   Time 1   Period Days   Status Achieved   Patient will be able to verbalize understanding of the importance of attending the postoperative After Breast Cancer Class for further lymphedema risk reduction education and therapeutic exercise.   Time 1   Period Days   Status Achieved          Long Term Clinic Goals - 05/14/15 1205    CC Long Term Goal  #1   Title Patient with verbalize an understanding of lymphedema risk reduction precautions   Status Achieved   CC Long Term Goal  #2   Title Patient will be independent in a  home exercise program for shoulder range of motion and strength    Status Partially Met   CC Long Term Goal  #3   Title Patient will report a decrease in pain by 75% so they can perform daily activities with greater ease    Status On-going   CC Long Term Goal  #4   Title Patient will decrease the DASH score to <  40   to demonstrate increased functional use of upper extremity   Status On-going            Plan - 05/14/15 1202    Clinical Impression Statement Pt continues to do well with therapy and tolerating exercises well. Advanced her today to include strengthening of bil shoulders with light weight to help instruct pt with things she  can do at the gym. Her compression sleeve should arrive in next week or so and pt reports will feel ready to do more UE strength at the gym when she has her sleeve.    Rehab Potential Excellent   Clinical Impairments Affecting Rehab Potential axillary node dissection on the right    PT Frequency 2x / week   PT Duration 4 weeks   PT Treatment/Interventions Passive range of motion;Manual techniques;Therapeutic exercise   PT Next Visit  Plan Continue P/AA/AROM Rt shoulder as pt feels her Lt is doing better. Progress HEP prn.   Consulted and Agree with Plan of Care Patient      Patient will benefit from skilled therapeutic intervention in order to improve the following deficits and impairments:  Decreased strength, Pain, Decreased knowledge of precautions, Impaired UE functional use, Decreased range of motion, Increased edema, Postural dysfunction  Visit Diagnosis: Stiffness of right shoulder, not elsewhere classified  Stiffness of left shoulder, not elsewhere classified  Pain in right shoulder  Abnormal posture  Muscle weakness (generalized)     Problem List Patient Active Problem List   Diagnosis Date Noted  . Genetic testing 03/09/2015  . Breast cancer of upper-inner quadrant of right female breast ([place]) 02/18/2015  . Depression, postpartum   . Yeast infection   . Fatigue   . Chronic headaches     Otelia Limes, PTA 05/14/2015, 12:06 PM  [place], [place], [address] Phone: [phone]   Fax:  [phone]  Name: RUTHENE METHVIN MRN: [id number] Date of Birth: 11/09/1965

## 2015-05-18 ENCOUNTER — Ambulatory Visit: Payer: BC Managed Care – PPO | Admitting: Physical Therapy

## 2015-05-20 ENCOUNTER — Ambulatory Visit: Payer: BC Managed Care – PPO | Admitting: Physical Therapy

## 2015-05-25 ENCOUNTER — Encounter: Payer: Self-pay | Admitting: Physical Therapy

## 2015-05-25 ENCOUNTER — Ambulatory Visit: Payer: BC Managed Care – PPO | Admitting: Physical Therapy

## 2015-05-25 DIAGNOSIS — M6281 Muscle weakness (generalized): Secondary | ICD-10-CM

## 2015-05-25 DIAGNOSIS — R293 Abnormal posture: Secondary | ICD-10-CM

## 2015-05-25 DIAGNOSIS — M25611 Stiffness of right shoulder, not elsewhere classified: Secondary | ICD-10-CM | POA: Diagnosis not present

## 2015-05-25 DIAGNOSIS — M25612 Stiffness of left shoulder, not elsewhere classified: Secondary | ICD-10-CM

## 2015-05-25 DIAGNOSIS — M25511 Pain in right shoulder: Secondary | ICD-10-CM

## 2015-05-25 NOTE — Therapy (Addendum)
[place], [place], [address] Phone: [phone]   Fax:  [phone]  Physical Therapy Treatment  Patient Details  Name: Brooke [name] MRN: [id number] Date of Birth: 10-Jul-1965 Referring Provider: Thimmappa   Encounter Date: 05/25/2015      PT End of Session - 05/25/15 1230    Visit Number 6   Number of Visits 9   Date for PT Re-Evaluation 05/22/15   PT Start Time 0936   PT Stop Time 1015   PT Time Calculation (min) 39 min   Activity Tolerance Patient tolerated treatment well   Behavior During Therapy [facility] for tasks assessed/performed      Past Medical History  Diagnosis Date  . Anemia   . Depression, postpartum   . Yeast infection   . Fatigue   . Chronic headaches   . Anxiety   . Breast cancer of upper-inner quadrant of right female breast ([place]) 02/18/2015  . Hot flashes     Past Surgical History  Procedure Laterality Date  . Gastric bypass    . Tubal ligation    . Cholecystectomy    . Mastectomy w/ sentinel node biopsy Right   . Breast lumpectomy with radioactive seed localization Left 04/06/2015  . Breast lumpectomy with radioactive seed localization Left 04/06/2015    Procedure: LEFT BREAST LUMPECTOMY WITH RADIOACTIVE SEED LOCALIZATION;  Surgeon: Alphonsa Overall, MD;  Location: [facility];  Service: General;  Laterality: Left;  Marland Kitchen Mastectomy w/ sentinel node biopsy Right 04/06/2015    Procedure: RIGHT MASTECTOMY WITH SENTINEL LYMPH NODE BIOPSY;  Surgeon: Alphonsa Overall, MD;  Location: [facility];  Service: General;  Laterality: Right;  . Breast reconstruction with placement of tissue expander and flex hd (acellular hydrated dermis) Right 04/06/2015    Procedure: RIGHT BREAST RECONSTRUCTION WITH PLACEMENT OF TISSUE EXPANDER, POSSIBLE ACELLULAR DERMIS;  Surgeon: Irene Limbo, MD;  Location: [facility];  Service: Plastics;  Laterality: Right;    There were no vitals filed for this visit.      Subjective  Assessment - 05/25/15 0936    Subjective Pt states she is not feeling very good today. She thinks it is from the Tamoxifen. She is going to see the doctor this week.    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  She has 2 areas which are both in the upper inner quadrant measuring 10 cm apart.  One area is a 2 cm mass and the other is a area of calcs.  Both are ER/PR positive and HER2 negative with a Ki67 of 25%.   On 04/06/3015 she had right masecectomy  with (27?)lymph nodes removed with immedicate expander placement and left lumpectomy with no nodes  removed. She does not have to have chemo or radiation . She currently has one drain implace and hopefully will have that removed next  week.  Past hisory includes a gastric bypass surgery.    Patient Stated Goals to get this soreness out of my arm and make sure I don't get lymphedema    Currently in Pain? No/denies                         [facility] Adult PT Treatment/Exercise - 05/25/15 0001    Shoulder Exercises: Standing   Flexion Strengthening;Both;10 reps;Weights  To shoulder height   Shoulder Flexion Weight (lbs) 2   ABduction Strengthening;Both;10 reps;Weights  To shoulder height   Shoulder ABduction Weight (lbs)  2   Other Standing Exercises Bil Scaption to sholder height 2 lbs, x 10 each   Shoulder Exercises: Pulleys   Flexion 2 minutes   ABduction 2 minutes   Shoulder Exercises: ROM/Strengthening   Other ROM/Strengthening Exercises Finger Ladder for Rt UE flexion and abduction 10 each   Manual Therapy   Myofascial Release right UE myofascial pulling with movement into shoulder abduction through partial range   Scapular Mobilization right scapula in left sidelying for protraction and depression at various angles   Passive ROM In supine to right shoulder in all planes to pts tolerance.                   Short Term Clinic Goals - 04/22/15 1402    CC Short Term Goal   #1   Title short term goals= long term goals            Long Term Clinic Goals - 05/14/15 1205    CC Long Term Goal  #1   Title Patient with verbalize an understanding of lymphedema risk reduction precautions   Status Achieved   CC Long Term Goal  #2   Title Patient will be independent in a  home exercise program for shoulder range of motion and strength    Status Partially Met   CC Long Term Goal  #3   Title Patient will report a decrease in pain by 75% so they can perform daily activities with greater ease    Status On-going   CC Long Term Goal  #4   Title Patient will decrease the DASH score to <  40   to demonstrate increased functional use of upper extremity   Status On-going            Plan - 05/25/15 1242    Clinical Impression Statement Pt is doing well with the ROM exercises. She had increased difficulty today with the strengthening exercises (2lb weights) for her shoulders. She felt like they were heavier today. Overall she has not been feeling well and is planning on seeing her doctor about this. She feels it may be due to her medication.    Rehab Potential Excellent   Clinical Impairments Affecting Rehab Potential axillary node dissection on the right    PT Frequency 2x / week   PT Duration 4 weeks   PT Treatment/Interventions Passive range of motion;Manual techniques;Therapeutic exercise   PT Next Visit Plan Continue P/AA/AROM Rt shoulder as pt feels her Lt is doing better. Progress HEP prn, add strengthening exercises as needed   Consulted and Agree with Plan of Care Patient      Patient will benefit from skilled therapeutic intervention in order to improve the following deficits and impairments:  Decreased strength, Pain, Decreased knowledge of precautions, Impaired UE functional use, Decreased range of motion, Increased edema, Postural dysfunction  Visit Diagnosis: Stiffness of right shoulder, not elsewhere classified  Pain in right shoulder  Stiffness  of left shoulder, not elsewhere classified  Abnormal posture  Muscle weakness (generalized)     Problem List Patient Active Problem List   Diagnosis Date Noted  . Genetic testing 03/09/2015  . Breast cancer of upper-inner quadrant of right female breast ([place]) 02/18/2015  . Depression, postpartum   . Yeast infection   . Fatigue   . Chronic headaches     Brooke [name] 05/25/2015, 12:45 PM  [place], [place], [address] Phone: [phone]   Fax:  [phone]  Name: Brooke [name] MRN: [id number] Date of Birth: [birth year]    Allyson Sabal, PT 05/25/2015 12:45 PM  PHYSICAL THERAPY DISCHARGE SUMMARY  Visits from Start of Care: 6  Current functional level related to goals / functional outcomes: See above   Remaining deficits: See above  Education / Equipment:  Plan: Patient agrees to discharge.  Patient goals were partially met. Patient is being discharged due to not returning since the last visit.  ?????   HEP Allyson Sabal, PT 11/24/15 8:14 AM

## 2015-05-26 ENCOUNTER — Telehealth: Payer: Self-pay

## 2015-05-26 DIAGNOSIS — C50211 Malignant neoplasm of upper-inner quadrant of right female breast: Secondary | ICD-10-CM

## 2015-05-26 NOTE — Telephone Encounter (Signed)
Pt has been having sweats tired and dizzy since starting tamoxifen in February. It has gotten worse in the last 3-4 days, so she is having day sweats as well as night sweats. She has to wipe herself down with a towel and then get chilled. She has not taken her temperature. She has constipation from her pain medication. She also has had decreased appetite with nausea and ocassional vomiting since surgery. She is also dizzy and tired. She is scheduled to start back to work next week. She does not feel ready to do that if she continues to feel this way. Instructed her to hold tamoxifen until she hears from MD tomorrow.

## 2015-05-27 ENCOUNTER — Ambulatory Visit: Payer: BC Managed Care – PPO | Admitting: Physical Therapy

## 2015-05-27 MED ORDER — VENLAFAXINE HCL 37.5 MG PO TABS: 37.5000 mg | ORAL_TABLET | Freq: Every day | ORAL | Status: DC

## 2015-05-27 NOTE — Addendum Note (Signed)
Addended by: Lujean Amel on: 05/27/2015 02:10 PM   Modules accepted: Orders

## 2015-05-27 NOTE — Telephone Encounter (Signed)
"  I called yesterday and have not heard back yet.  I cannot operate like this.  I'm in bed.   Need something to cope with the tamoxifen side effects.  I was sweating at night, now it's during the day I am dripping with sweat from my neck, head, dizzy, nausea, I need to know what to do.  I need zofran or something for nausea.  I have not taken it today and may try tonight to see if this helps..  Return number [phone].

## 2015-05-27 NOTE — Telephone Encounter (Signed)
Dr. Jana Hakim aware.  Writer waiting on directions from MD.

## 2015-05-27 NOTE — Telephone Encounter (Signed)
Writer called patient back several times and finally LVM that MD would like to start her on effexor 37.5 mg daily.  Writer will eprescribe the new medication to patient [facility]. Patient encouraged to call back with questions.

## 2015-05-28 ENCOUNTER — Other Ambulatory Visit: Payer: Self-pay

## 2015-05-28 ENCOUNTER — Ambulatory Visit: Payer: BC Managed Care – PPO | Admitting: Nutrition

## 2015-05-28 DIAGNOSIS — C50211 Malignant neoplasm of upper-inner quadrant of right female breast: Secondary | ICD-10-CM

## 2015-05-28 MED ORDER — PROCHLORPERAZINE MALEATE 10 MG PO TABS: ORAL_TABLET | ORAL | Status: DC

## 2015-05-28 NOTE — Progress Notes (Signed)
50 year old female diagnosed with ER positive breast cancer.  Past medical history includes anemia, depression, fatigue, anxiety, gastric bypass surgery.  Medications include multivitamin and tamoxifen.  Labs include albumin 3.3.  Height: 5 feet 11 inches. Weight: 232.6 pounds on April 21. Usual body weight: 246 pounds. BMI: 32.34.  Patient is not receiving radiation therapy or chemotherapy. She is interested in nutrition information on healthy diet for reducing the risk for breast cancer recurrence. She is experiencing hot flashes from tamoxifen  Nutrition diagnosis: Food and nutrition related knowledge deficit related to breast cancer and associated treatments as evidenced by no prior need for nutrition related information.  Intervention:  Educated patient on importance of consuming a healthy plant-based diet with increased fruits and vegetables and whole gr along with lean proteins Encouraged patient to continue 3 meals daily with good portion control. Recommended patient restrict calories to between 1600 and 1800 cal daily to achieve safe weight loss. Provided multiple fact sheets on information presented. Questions were answered and teach back method used.  Monitoring, evaluation, goals: Patient will tolerate adequate calories and protein to promote safe weight loss.  No follow-up necessary. Nutrition diagnosis has resolved.  **Disclaimer: This note was dictated with voice recognition software. Similar sounding words can inadvertently be transcribed and this note may contain transcription errors which may not have been corrected upon publication of note.**

## 2015-06-03 ENCOUNTER — Encounter: Payer: Self-pay | Admitting: Physical Therapy

## 2015-06-03 ENCOUNTER — Other Ambulatory Visit: Payer: Self-pay | Admitting: Oncology

## 2015-06-09 ENCOUNTER — Encounter: Payer: BC Managed Care – PPO | Admitting: Nurse Practitioner

## 2015-06-10 ENCOUNTER — Telehealth: Payer: Self-pay | Admitting: *Deleted

## 2015-06-10 NOTE — Telephone Encounter (Signed)
TC to pt regarding missed Survivorship appt. Pt states she was not aware of appt at all. Pt requested June 21 at [place] a calender to the patient for confirmation.

## 2015-06-22 ENCOUNTER — Other Ambulatory Visit: Payer: Self-pay

## 2015-06-22 ENCOUNTER — Encounter: Payer: Self-pay | Admitting: Nurse Practitioner

## 2015-06-22 ENCOUNTER — Telehealth: Payer: Self-pay | Admitting: Adult Health

## 2015-06-22 DIAGNOSIS — C50211 Malignant neoplasm of upper-inner quadrant of right female breast: Secondary | ICD-10-CM

## 2015-06-22 MED ORDER — VENLAFAXINE HCL 37.5 MG PO TABS: 37.5000 mg | ORAL_TABLET | Freq: Every day | ORAL | Status: DC

## 2015-06-22 MED ORDER — PROCHLORPERAZINE MALEATE 10 MG PO TABS: ORAL_TABLET | ORAL | Status: DC

## 2015-06-22 NOTE — Telephone Encounter (Signed)
I received a call from Ms. Brooke [name] regarding her upcoming survivorship appt that was scheduled for 06/24/15. She tells me that she has an interview that day and needs to reschedule.  I offered her another date/time, which she was unable to do.  I offered to have her Survivorship Care Plan and resources mailed to her instead, given her busy schedule, and she agreed with this plan.  I encouraged her to call us back when she received the materials if she has questions or wishes to reschedule the appt at that time.  She agreed.   I have cancelled her 06/24/15 appt with Chestine Spore, NP and will send this message to her to make her aware that the patient prefers to have the information mailed to her given her busy schedule.    Mike Craze, NP [place] [phone]

## 2015-06-22 NOTE — Progress Notes (Signed)
The Survivorship Care Plan was mailed to Brooke [name] as she reported not being able to come in to the Survivorship Clinic for an in-person visit at this time. A letter was mailed to her outlining the purpose of the content of the care plan, as well as encouraging her to reach out to me with any questions or concerns.  My business card was included in the correspondence to the patient as well.  A copy of the care plan was also routed/faxed/mailed to Brooke Manges, Brooke [name], the patient's PCP.  I will not be placing any follow-up appointments to the Survivorship Clinic for Brooke [name], but I am happy to see her at any time in the future for any survivorship concerns that may arise. Thank you for allowing me to participate in her care!  Kenn File, [place] [phone]

## 2015-06-24 ENCOUNTER — Encounter: Payer: BC Managed Care – PPO | Admitting: Nurse Practitioner

## 2015-07-17 NOTE — H&P (Signed)
  Subjective:    Patient ID: Brooke [name] is a 50 y.o. female.  HPI  Nearly 3 months postop right mastectomy with TE/ADM reconstruction and left lumpectomy. Reports happy with current size on right. Plan implant exchange on right, lipofilling, and left reduction/mastopexy for symmetry.  Presented following screening MMG with right breast mass and calcifications . Diagnostic MMG revealed a spiculated mass with pleomorphic calcifications measuring 1.5 cm in the UIQ of the right breast. Approximately 5.5 cm posterior to this mass is a more subtle area of density with pleomorphic calcifications measuring 1.0 cm. US of the 12:30 o'clock position of the right breast, 2 cm from the nipple, showed an irregular mass measuring 1.9 x 1.6 x 2.2 cm. US of the right axilla revealed normal appearing lymph nodes with preserved fatty hila. Biopsy of the 12:30 o'clock mass on revealed IDC with DCIS, ER / PR +, HER2 -. Biopsy of the UIQ of the right breast revealed IDC with DCIS and calcifications ER/PR +, HER2 -. MRI demonstrated within right breast upper inner quadrant, 3 areas of abnormal enhancement. The LEFT breast demonstrated a 1.3 x 1.6 cm abnormal enhancing spiculated mass. A second area of abnormal enhancement measuring 1.2 x 2.1 cm in the posterior depth. At the medial posterior upper left breast is a 0.9 x 0.7 cm area of abnormal enhancement. Left breast biopsy with ADH.  Final pathology left breast benign, right breast with two foci IDC 2 cm and 0.5 cm. 0/5 nodes.  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 Oncotype low risk. Plan tamoxifen for 10 years.  Prior DD, desires "C". Right mastectomy wt 1415 g   Objective:   Physical Exam  Cardiovascular: Normal rate, regular rhythm and normal heart sounds.   Pulmonary/Chest: Effort normal and breath sounds normal.  Abdominal: Soft.  No hernias, midline laparotomy scar     Chest: right soft with redundant soft tissue medially,  Sn to  nipple L 32 cm, scar over UOQ BW R 13 L 14 Nipple to IMF L 15 cm  Assessment:     R breast cancer Hx gastric bypass S/p left lumpectomy, right mastectomy, TE/ADM reconstruction    Plan:     Plan second stage surgery for left reduction, right placement implant possible lipofilling.   Reviewed saline vs silicone, rupture rate, rippling, contracture, need for MRI surveillance of silicone implants. Reviewed left reduction with anchor type scars, possible drains, post operative visits and limitations, recovery. Diminished sensation nipple and breast skin, risk of nipple loss, wound healing problems, asymmetry, incidental carcinoma, changes with wt gain/loss, aging, unacceptable cosmetic appearance reviewed. Reviewed purpose fat grafting to aid with contour thicken flaps. Reviewed donor site likely flank abdomen to avoid laparotomy scar. Reviewed variable take graft, donor site compression, pain, bruising.   She desires for saline implants. Will plan smooth round. Desires overnight stay.    Natrelle [id number] 500 ml tissue expander,  fill volume 475 ml   Irene Limbo, MD [facility] [phone], pin [phone]

## 2015-07-19 NOTE — Progress Notes (Signed)
[place]  [phone] [phone]     ID: Brooke [name] DOB: [birth year]  MR#: [id number]  GNO#:[id number]  Patient Care Team: Brooke Manges, MD as PCP - General (Obstetrics and Gynecology) Brooke Cruel, MD as Consulting Physician (Oncology) Brooke Silversmith, MD as Consulting Physician (Radiation Oncology) Brooke Overall, MD as Consulting Physician (General Surgery) Brooke Cheese, NP as Nurse Practitioner (Hematology and Oncology) Brooke Limbo, MD as Consulting Physician (Plastic Surgery) PCP: Brooke Manges, MD OTHER MD: [name]  CHIEF COMPLAINT: Estrogen receptor positive breast cancer  CURRENT TREATMENT: Tamoxifen   BREAST CANCER HISTORY: From the original intake note:  Brooke [name] had routine gynecologic follow-up with Dr. Caralee [name] office and as part of that exam she had screening bilateral mammography which showed a possible mass and calcifications in the right breast. She was referred to the [facility] where on 02/12/2015 she had a right diagnostic mammography with tomo synthesis and right breast ultrasonography. In the upper inner quadrant of the right breast there was a spiculated mass measuring approximately 1.5 cm. Posterior to this mass, approximately 5.5 cm away, there was a subtle area of pleomorphic calcifications spanning 1 cm. By physical exam the upper inner quadrant right breast mass was palpable 2-3 cm from the nipple. By ultrasound this proved to be irregular and heterogeneous and measuring approximately 2.2 cm. The right axilla was benign.  On 02/16/2015 the patient underwent biopsy of the right breast mass in question and also a more posterior area of calcifications, not seen on the original mammogram, 10 cm away from the biopsied mass. 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 Biopsy of the more posterior area of calcifications also showed invasive as well as in situ ductal carcinoma, grade 1 or 2, estrogen receptor 95% positive, with strong staining intensity, progesterone receptor 45% positive, with moderate staining intensity, with an MIB-1 of 25%, and HER-2 also negative, with a signals ratio of 1.48 and the number per cell being 2.00.  The patient's subsequent history is as detailed below  INTERVAL HISTORY: Brooke [name] returns today for follow-up of her estrogen receptor positive breast cancer.  She was having nausea with Tamoxifen, but went to the [facility] on breast cancer and someone suggested she take tamoxifen at bedtime. That took care of that problem  REVIEW OF SYSTEMS: Brooke [name] continues to feel tired, but she tells me she walks 4 miles everyday, which is terrific. She still having hot flashes. They can wake her up at night. She is taking venlafaxine 37.5 mg daily. She has no side effects from this but also no benefit. Aside from these issues a detailed review of systems today was noncontributory  PAST MEDICAL HISTORY: Past Medical History  Diagnosis Date  . Anemia   . Depression, postpartum   . Yeast infection   . Fatigue   . Chronic headaches   . Anxiety   . Breast cancer of upper-inner quadrant of right female breast ([place]) 02/18/2015  . Hot flashes     PAST SURGICAL HISTORY: Past Surgical History  Procedure Laterality Date  . Gastric bypass    . Tubal ligation    . Cholecystectomy    . Mastectomy w/ sentinel node biopsy Right   . Breast lumpectomy with radioactive seed localization Left  04/06/2015  . Breast lumpectomy with radioactive seed localization Left 04/06/2015    Procedure: LEFT BREAST LUMPECTOMY WITH RADIOACTIVE SEED LOCALIZATION;  Surgeon: Brooke Overall, MD;  Location: [facility];  Service: General;  Laterality: Left;  Marland Kitchen  Mastectomy w/ sentinel node biopsy Right 04/06/2015    Procedure: RIGHT MASTECTOMY WITH SENTINEL LYMPH NODE BIOPSY;  Surgeon: Brooke Overall, MD;  Location: [facility];  Service: General;  Laterality: Right;  . Breast reconstruction with placement of tissue expander and flex hd (acellular hydrated dermis) Right 04/06/2015    Procedure: RIGHT BREAST RECONSTRUCTION WITH PLACEMENT OF TISSUE EXPANDER, POSSIBLE ACELLULAR DERMIS;  Surgeon: Brooke Limbo, MD;  Location: [facility];  Service: Plastics;  Laterality: Right;    FAMILY HISTORY Family History  Problem Relation Age of Onset  . Diabetes Mother   . Hypertension Mother   . Spinal muscular atrophy Mother   . Irregular heart beat Mother   . Anxiety disorder Mother   . Arthritis Mother   . Cancer Other     maternal great grandfather dx. unspecified type cancer  . Heart attack Maternal Grandmother   . Heart attack Maternal Uncle   . Cancer Cousin     paternal 1st cousin dx. with cancer that was typically a childhood cancer   the patient has little information on her father's side of the family. Her mother died of complications of nonalcoholic cirrhosis of the age of 51. The patient had 4 brothers, 2 sisters. A maternal great-grandfather reportedly had cancer of some type, but she has no further information.  GYNECOLOGIC HISTORY:  No LMP recorded.  menarche age 69, first live birth age 82, [name] P2. She has periods fairly irregularly at present and they last about 3 days. She used Depo Provera last in 2000.   SOCIAL HISTORY:   Brooke [name] teaches elementary school. Her husband Brooke [name] is a Customer service manager. Son Brooke [name] (pronounced "X--Brooke [name]") is a Environmental consultant at the [employer] in [place]. [name] is at home with the patient. There are no grandchildren. The patient attends the [employer] church in [place]: not in place    HEALTH MAINTENANCE: Social History  Substance Use Topics  . Smoking  status: Never Smoker   . Smokeless tobacco: Never Used  . Alcohol Use: 2.0 - 2.5 oz/week    4-5 Standard drinks or equivalent per week     Comment: wine and beer on weekends     Colonoscopy: never   PAP: February 2017  Bone density: never   Lipid panel:  No Known Allergies  Current Outpatient Prescriptions  Medication Sig Dispense Refill  . aspirin-sod bicarb-citric acid (ALKA-SELTZER) 325 MG TBEF tablet Take 325 mg by mouth every 8 (eight) hours as needed (reflux). Reported on 04/22/2015    . Multiple Vitamins-Minerals (MULTIVITAMIN ADULT PO) Take 1 tablet by mouth daily.     . prochlorperazine (COMPAZINE) 10 MG tablet Take one tablet three times daily as needed for nausea. 30 tablet 0  . tamoxifen (NOLVADEX) 20 MG tablet Take 1 tablet (20 mg total) by mouth daily. 90 tablet 4  . valACYclovir (VALTREX) 500 MG tablet Take 500 mg by mouth daily.    Marland Kitchen venlafaxine (EFFEXOR) 37.5 MG tablet Take 1-2 tablets (37.5-75 mg total) by mouth daily. 30 tablet 3   No current facility-administered medications for this visit.    OBJECTIVE: middle-aged African-American woman In no acute distress  Filed Vitals:   07/20/15 1520  BP: 115/65  Pulse: 64  Temp: 98.2 F (36.8 C)  Resp: 17     Body mass index is 33.63 kg/(m^2).    ECOG FS:1 - Symptomatic but completely ambulatory  Sclerae unicteric, pupils round and equal Oropharynx clear and moist-- no thrush or other lesions No cervical or supraclavicular adenopathy Lungs no rales or rhonchi Heart regular rate and rhythm Abd soft, nontender, positive bowel sounds MSK no focal spinal tenderness, no upper extremity lymphedema Neuro: nonfocal, well oriented, appropriate affect Breasts: Deferred   LAB RESULTS:  CMP     Component Value Date/Time   NA 141 04/24/2015 1243   NA 142 03/30/2015 0823   K 4.1 04/24/2015 1243   K 4.2 03/30/2015 0823   CL 111 03/30/2015 0823   CO2 23 04/24/2015 1243   CO2 21* 03/30/2015 0823   GLUCOSE 100  04/24/2015 1243   GLUCOSE 103* 03/30/2015 0823   BUN 11.5 04/24/2015 1243   BUN 8 03/30/2015 0823   CREATININE 0.8 04/24/2015 1243   CREATININE 0.72 03/30/2015 0823   CREATININE 0.66 03/14/2014 1354   CALCIUM 8.6 04/24/2015 1243   CALCIUM 9.0 03/30/2015 0823   PROT 6.4 04/24/2015 1243   PROT 6.8 03/14/2014 1354   ALBUMIN 3.3* 04/24/2015 1243   ALBUMIN 3.9 03/14/2014 1354   AST 18 04/24/2015 1243   AST 22 03/14/2014 1354   ALT 11 04/24/2015 1243   ALT 19 03/14/2014 1354   ALKPHOS 53 04/24/2015 1243   ALKPHOS 51 03/14/2014 1354   BILITOT <0.30 04/24/2015 1243   BILITOT 0.4 03/14/2014 1354   GFRNONAA >60 03/30/2015 0823   GFRNONAA >89 03/14/2014 1354   GFRAA >60 03/30/2015 0823   GFRAA >89 03/14/2014 1354    INo results found for: SPEP, UPEP  Lab Results  Component Value Date   WBC 6.0 07/20/2015   NEUTROABS 2.6 07/20/2015   HGB 11.2* 07/20/2015   HCT 34.3* 07/20/2015   MCV 94.6 07/20/2015   PLT 263 07/20/2015      Chemistry      Component Value Date/Time   NA 141 04/24/2015 1243   NA 142 03/30/2015 0823   K 4.1 04/24/2015 1243   K 4.2 03/30/2015 0823   CL 111 03/30/2015 0823   CO2 23 04/24/2015 1243   CO2 21* 03/30/2015 0823   BUN 11.5 04/24/2015 1243   BUN 8 03/30/2015 0823   CREATININE 0.8 04/24/2015 1243   CREATININE 0.72 03/30/2015 0823   CREATININE 0.66 03/14/2014 1354      Component Value Date/Time   CALCIUM 8.6 04/24/2015 1243   CALCIUM 9.0 03/30/2015 0823   ALKPHOS 53 04/24/2015 1243   ALKPHOS 51 03/14/2014 1354   AST 18 04/24/2015 1243   AST 22 03/14/2014 1354   ALT 11 04/24/2015 1243   ALT 19 03/14/2014 1354   BILITOT <0.30 04/24/2015 1243   BILITOT 0.4 03/14/2014 1354       No results found for: LABCA2  No components found for: LABCA125  No results for input(s): INR in the last 168 hours.  Urinalysis    Component Value Date/Time   BILIRUBINUR neg 05/03/2012 0954   PROTEINUR neg 05/03/2012 0954   UROBILINOGEN 0.2 05/03/2012 0954    NITRITE positive 05/03/2012 0954   LEUKOCYTESUR Trace 05/03/2012 0954      ELIGIBLE FOR AVAILABLE RESEARCH PROTOCOL: no  STUDIES: No results found.  ASSESSMENT: 50 y.o. [place] woman s/p biopsy 02/17/2015 of 2 places in her right breastUpper inner quadrant, both showing invasive ductal  carcinoma, clinically T2 NX, stage II, estrogen receptor and progesterone receptor strongly positive, HER-2/neu nonamplified, with an MIB-1 of 25%  (1) genetics testing 03/06/2015 through theBreast/Ovarian Cancer Panel offered by[facility]x Laboratoriesfound no deleterious mutations in ATM, BARD1, BRCA1, BRCA2, BRIP1, CDH1, CHEK2, FANCC, MLH1, MSH2, MSH6, NBN, PALB2, PMS2, PTEN, RAD51C, RAD51D, TP53, and XRCC2. This panel also includes deletion/duplication analysis (without sequencing) for one gene, EPCAM.   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    (2) Oncotype DX score of 5 predicts an outside the breast risk of recurrence within 10 years of 5% if the patient's only systemic therapy is tamoxifen for 5 years. It also predicts no benefit from chemotherapy  (3) left breast biopsy 03/10/2015 shows an area of atypical ductal hyperplasia  (a) left lumpectomy for 03/23/2015 showed only the usual ductal hyperplasia, no atypia or tumor  (4) right mastectomy with sentinel lymph node sampling 04/06/2015 shows an mpT1C pN0, stage IA invasive ductal carcinoma, grade 2 with negative margins repeat HER-2 (2) again negative  (5) adjuvant radiation not indicated  (6) tamoxifen started neoadjuvantly on 02/25/2015  PLAN: Brooke [name] is tolerating tamoxifen well, now that she is taking it at bedtime so she no longer has nausea.  While she complains of fatigue, I think that must be residual from her earlier treatments. In any case she is doing exactly the right thing, which is to walk several miles every day.  She is trying to lose  weight. She is actually on a pretty good diet by her own description. We discussed going off carbohydrates as the chief when a of at least not gaining if not actually losing weight.  As far as the hot flashes are concerned she is can double up on the venlafaxine to 75 mg for a couple of weeks and if she finds that is helpful she will let us know and we will write her for 75 mg tablet.  Otherwise the plan is to continue on tamoxifen for at least 2 years after which we could consider switching to an aromatase inhibitor. She will see me again in February 2018 after her next mammogram.  I encouraged her to participate in the "finding your new normal" program here  She knows to call for any problems that may develop before the next visit.  Brooke Cruel, MD   07/20/2015 3:46 PM Medical Oncology and Hematology [facility] [address] [place], [place] [address] Tel. [phone]    Fax. [phone]

## 2015-07-20 ENCOUNTER — Other Ambulatory Visit (HOSPITAL_BASED_OUTPATIENT_CLINIC_OR_DEPARTMENT_OTHER): Payer: BC Managed Care – PPO

## 2015-07-20 ENCOUNTER — Ambulatory Visit (HOSPITAL_BASED_OUTPATIENT_CLINIC_OR_DEPARTMENT_OTHER): Payer: BC Managed Care – PPO | Admitting: Oncology

## 2015-07-20 VITALS — BP 115/65 | HR 64 | Temp 98.2°F | Resp 17 | Ht 71.0 in | Wt 241.0 lb

## 2015-07-20 DIAGNOSIS — Z79810 Long term (current) use of selective estrogen receptor modulators (SERMs): Secondary | ICD-10-CM

## 2015-07-20 DIAGNOSIS — C50211 Malignant neoplasm of upper-inner quadrant of right female breast: Secondary | ICD-10-CM

## 2015-07-20 DIAGNOSIS — Z170 Estrogen receptor positive status [ER+]: Secondary | ICD-10-CM | POA: Diagnosis not present

## 2015-07-20 DIAGNOSIS — R5383 Other fatigue: Secondary | ICD-10-CM

## 2015-07-20 LAB — CBC WITH DIFFERENTIAL/PLATELET
BASO%: 1.1 % (ref 0.0–2.0)
BASOS ABS: 0.1 10*3/uL (ref 0.0–0.1)
EOS%: 4.4 % (ref 0.0–7.0)
Eosinophils Absolute: 0.3 10*3/uL (ref 0.0–0.5)
HCT: 34.3 % — ABNORMAL LOW (ref 34.8–46.6)
HGB: 11.2 g/dL — ABNORMAL LOW (ref 11.6–15.9)
LYMPH%: 41.8 % (ref 14.0–49.7)
MCH: 30.9 pg (ref 25.1–34.0)
MCHC: 32.7 g/dL (ref 31.5–36.0)
MCV: 94.6 fL (ref 79.5–101.0)
MONO#: 0.5 10*3/uL (ref 0.1–0.9)
MONO%: 9 % (ref 0.0–14.0)
NEUT#: 2.6 10*3/uL (ref 1.5–6.5)
NEUT%: 43.7 % (ref 38.4–76.8)
PLATELETS: 263 10*3/uL (ref 145–400)
RBC: 3.62 10*6/uL — AB (ref 3.70–5.45)
RDW: 14.6 % — ABNORMAL HIGH (ref 11.2–14.5)
WBC: 6 10*3/uL (ref 3.9–10.3)
lymph#: 2.5 10*3/uL (ref 0.9–3.3)

## 2015-07-20 LAB — COMPREHENSIVE METABOLIC PANEL
ALK PHOS: 59 U/L (ref 40–150)
ALT: 14 U/L (ref 0–55)
ANION GAP: 7 meq/L (ref 3–11)
AST: 19 U/L (ref 5–34)
Albumin: 3.5 g/dL (ref 3.5–5.0)
BUN: 17.4 mg/dL (ref 7.0–26.0)
CALCIUM: 8.3 mg/dL — AB (ref 8.4–10.4)
CHLORIDE: 108 meq/L (ref 98–109)
CO2: 24 meq/L (ref 22–29)
Creatinine: 0.8 mg/dL (ref 0.6–1.1)
GLUCOSE: 99 mg/dL (ref 70–140)
POTASSIUM: 4 meq/L (ref 3.5–5.1)
Sodium: 139 mEq/L (ref 136–145)
Total Protein: 6.7 g/dL (ref 6.4–8.3)

## 2015-07-20 MED ORDER — VENLAFAXINE HCL 37.5 MG PO TABS: 37.5000 mg | ORAL_TABLET | Freq: Every day | ORAL | Status: DC

## 2015-07-24 NOTE — Pre-Procedure Instructions (Signed)
    JAMESHIA KORST  07/24/2015      [id number] [place], [place] - [address] [facility] [address] [place] [address] Phone: [phone] Fax: [phone]    Your procedure is scheduled on 08/03/15.  Report to [facility] Admitting at [phone] A.M.  Call this number if you have problems the morning of surgery:  [phone]   Remember:  Do not eat food or drink liquids after midnight.  Take these medicines the morning of surgery with A SIP OF WATER --tamoxifen,effexor   Do not wear jewelry, make-up or nail polish.  Do not wear lotions, powders, or perfumes.  You may wear deoderant.  Do not shave 48 hours prior to surgery.  Men may shave face and neck.  Do not bring valuables to the hospital.  [facility] is not responsible for any belongings or valuables.  Contacts, dentures or bridgework may not be worn into surgery.  Leave your suitcase in the car.  After surgery it may be brought to your room.  For patients admitted to the hospital, discharge time will be determined by your treatment team.  Patients discharged the day of surgery will not be allowed to drive home.   Name and phone number of your driver:   Special instructions: Do not take any aspirin,anti-inflammatories,vitamins,or herbal supplements 5-7 days prior to surgery.  Please read over the following fact sheets that you were given.

## 2015-07-27 ENCOUNTER — Inpatient Hospital Stay (HOSPITAL_COMMUNITY)
Admission: RE | Admit: 2015-07-27 | Discharge: 2015-07-27 | Disposition: A | Discharge status: Home / Self Care | Payer: BC Managed Care – PPO | Source: Ambulatory Visit

## 2015-07-27 ENCOUNTER — Encounter (HOSPITAL_COMMUNITY): Payer: Self-pay

## 2015-07-27 ENCOUNTER — Encounter (HOSPITAL_BASED_OUTPATIENT_CLINIC_OR_DEPARTMENT_OTHER): Payer: Self-pay | Admitting: *Deleted

## 2015-08-03 ENCOUNTER — Encounter (HOSPITAL_BASED_OUTPATIENT_CLINIC_OR_DEPARTMENT_OTHER)
Admission: RE | Disposition: A | Discharge status: Home / Self Care | Payer: Self-pay | Source: Ambulatory Visit | Attending: Plastic Surgery

## 2015-08-03 ENCOUNTER — Ambulatory Visit (HOSPITAL_BASED_OUTPATIENT_CLINIC_OR_DEPARTMENT_OTHER): Payer: BC Managed Care – PPO | Admitting: Anesthesiology

## 2015-08-03 ENCOUNTER — Encounter (HOSPITAL_BASED_OUTPATIENT_CLINIC_OR_DEPARTMENT_OTHER): Payer: Self-pay | Admitting: Anesthesiology

## 2015-08-03 ENCOUNTER — Ambulatory Visit (HOSPITAL_BASED_OUTPATIENT_CLINIC_OR_DEPARTMENT_OTHER)
Admission: RE | Admit: 2015-08-03 | Discharge: 2015-08-03 | Disposition: A | Discharge status: Home / Self Care | Payer: BC Managed Care – PPO | Source: Ambulatory Visit | Attending: Plastic Surgery | Admitting: Plastic Surgery

## 2015-08-03 DIAGNOSIS — N651 Disproportion of reconstructed breast: Secondary | ICD-10-CM | POA: Diagnosis not present

## 2015-08-03 DIAGNOSIS — N6012 Diffuse cystic mastopathy of left breast: Secondary | ICD-10-CM | POA: Insufficient documentation

## 2015-08-03 DIAGNOSIS — Z6832 Body mass index (BMI) 32.0-32.9, adult: Secondary | ICD-10-CM | POA: Insufficient documentation

## 2015-08-03 DIAGNOSIS — Z9884 Bariatric surgery status: Secondary | ICD-10-CM | POA: Insufficient documentation

## 2015-08-03 DIAGNOSIS — F329 Major depressive disorder, single episode, unspecified: Secondary | ICD-10-CM | POA: Insufficient documentation

## 2015-08-03 DIAGNOSIS — F419 Anxiety disorder, unspecified: Secondary | ICD-10-CM | POA: Diagnosis not present

## 2015-08-03 DIAGNOSIS — Z9011 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: Secondary | ICD-10-CM | POA: Diagnosis not present

## 2015-08-03 DIAGNOSIS — Z853 Personal history of malignant neoplasm of breast: Secondary | ICD-10-CM | POA: Diagnosis not present

## 2015-08-03 DIAGNOSIS — E669 Obesity, unspecified: Secondary | ICD-10-CM | POA: Insufficient documentation

## 2015-08-03 HISTORY — PX: REMOVAL OF BILATERAL TISSUE EXPANDERS WITH PLACEMENT OF BILATERAL BREAST IMPLANTS: SHX6431

## 2015-08-03 HISTORY — DX: Depression, unspecified: F32.A

## 2015-08-03 HISTORY — DX: Major depressive disorder, single episode, unspecified: F32.9

## 2015-08-03 HISTORY — PX: LIPOSUCTION WITH LIPOFILLING: SHX6436

## 2015-08-03 HISTORY — PX: BREAST REDUCTION SURGERY: SHX8

## 2015-08-03 SURGERY — REMOVAL, TISSUE EXPANDER, BREAST, BILATERAL, WITH BILATERAL IMPLANT IMPLANT INSERTION
Anesthesia: General | Site: Chest | Laterality: Right

## 2015-08-03 MED ORDER — SULFAMETHOXAZOLE-TRIMETHOPRIM 800-160 MG PO TABS: 1.0000 | ORAL_TABLET | Freq: Two times a day (BID) | ORAL | 0 refills | Status: DC

## 2015-08-03 MED ORDER — METHOCARBAMOL 500 MG PO TABS: 500.0000 mg | ORAL_TABLET | Freq: Three times a day (TID) | ORAL | 0 refills | Status: DC | PRN

## 2015-08-03 MED ORDER — HYDROMORPHONE HCL 1 MG/ML IJ SOLN
INTRAMUSCULAR | Status: AC
  Filled 2015-08-03: qty 1

## 2015-08-03 MED ORDER — SODIUM BICARBONATE 4 % IV SOLN
INTRAVENOUS | Status: AC
Start: 2015-08-03 — End: 2015-08-03
  Filled 2015-08-03: qty 5

## 2015-08-03 MED ORDER — MIDAZOLAM HCL 2 MG/2ML IJ SOLN
1.0000 mg | INTRAMUSCULAR | Status: DC | PRN
  Administered 2015-08-03: 2 mg via INTRAVENOUS

## 2015-08-03 MED ORDER — ACETAMINOPHEN 10 MG/ML IV SOLN
INTRAVENOUS | Status: DC | PRN
  Administered 2015-08-03: 1000 mg via INTRAVENOUS

## 2015-08-03 MED ORDER — SODIUM CHLORIDE 0.9 % IJ SOLN
INTRAMUSCULAR | Status: DC | PRN
  Administered 2015-08-03: 20 mL via INTRAVENOUS

## 2015-08-03 MED ORDER — PROPOFOL 10 MG/ML IV BOLUS
INTRAVENOUS | Status: DC | PRN
  Administered 2015-08-03: 200 mg via INTRAVENOUS
  Administered 2015-08-03: 50 mg via INTRAVENOUS

## 2015-08-03 MED ORDER — CHLORHEXIDINE GLUCONATE CLOTH 2 % EX PADS: 6.0000 | MEDICATED_PAD | Freq: Once | CUTANEOUS | Status: DC

## 2015-08-03 MED ORDER — OXYCODONE HCL 5 MG PO TABS
ORAL_TABLET | ORAL | Status: AC
  Filled 2015-08-03: qty 2

## 2015-08-03 MED ORDER — SCOPOLAMINE 1 MG/3DAYS TD PT72: 1.0000 | MEDICATED_PATCH | Freq: Once | TRANSDERMAL | Status: DC | PRN

## 2015-08-03 MED ORDER — GLYCOPYRROLATE 0.2 MG/ML IJ SOLN: 0.2000 mg | Freq: Once | INTRAMUSCULAR | Status: DC | PRN

## 2015-08-03 MED ORDER — LACTATED RINGERS IV SOLN
INTRAVENOUS | Status: DC
  Administered 2015-08-03 (×3): via INTRAVENOUS

## 2015-08-03 MED ORDER — OXYCODONE HCL 5 MG PO TABS: 5.0000 mg | ORAL_TABLET | ORAL | 0 refills | Status: DC | PRN

## 2015-08-03 MED ORDER — ACETAMINOPHEN 10 MG/ML IV SOLN
INTRAVENOUS | Status: AC
  Filled 2015-08-03: qty 100

## 2015-08-03 MED ORDER — ROCURONIUM BROMIDE 100 MG/10ML IV SOLN
INTRAVENOUS | Status: DC | PRN
  Administered 2015-08-03: 50 mg via INTRAVENOUS

## 2015-08-03 MED ORDER — DEXAMETHASONE SODIUM PHOSPHATE 10 MG/ML IJ SOLN
INTRAMUSCULAR | Status: AC
  Filled 2015-08-03: qty 1

## 2015-08-03 MED ORDER — SODIUM BICARBONATE 4 % IV SOLN
INTRAVENOUS | Status: DC | PRN
  Administered 2015-08-03: 350 mL via INTRAMUSCULAR

## 2015-08-03 MED ORDER — ONDANSETRON HCL 4 MG/2ML IJ SOLN
INTRAMUSCULAR | Status: AC
  Filled 2015-08-03: qty 2

## 2015-08-03 MED ORDER — BUPIVACAINE LIPOSOME 1.3 % IJ SUSP
INTRAMUSCULAR | Status: DC | PRN
  Administered 2015-08-03: 20 mL

## 2015-08-03 MED ORDER — CEFAZOLIN SODIUM-DEXTROSE 2-4 GM/100ML-% IV SOLN
2.0000 g | INTRAVENOUS | Status: AC
  Administered 2015-08-03: 2 g via INTRAVENOUS

## 2015-08-03 MED ORDER — SUFENTANIL CITRATE 50 MCG/ML IV SOLN
INTRAVENOUS | Status: AC
  Filled 2015-08-03: qty 1

## 2015-08-03 MED ORDER — HYDROMORPHONE HCL 1 MG/ML IJ SOLN
0.2500 mg | INTRAMUSCULAR | Status: DC | PRN
  Administered 2015-08-03 (×2): 0.5 mg via INTRAVENOUS
  Administered 2015-08-03 (×2): 0.25 mg via INTRAVENOUS
  Administered 2015-08-03: 0.5 mg via INTRAVENOUS

## 2015-08-03 MED ORDER — SUFENTANIL CITRATE 50 MCG/ML IV SOLN
INTRAVENOUS | Status: DC | PRN
  Administered 2015-08-03: 5 ug via INTRAVENOUS
  Administered 2015-08-03: 15 ug via INTRAVENOUS
  Administered 2015-08-03 (×3): 10 ug via INTRAVENOUS

## 2015-08-03 MED ORDER — HYDROCODONE-ACETAMINOPHEN 7.5-325 MG PO TABS: 1.0000 | ORAL_TABLET | Freq: Once | ORAL | Status: DC | PRN

## 2015-08-03 MED ORDER — LIDOCAINE HCL (CARDIAC) 20 MG/ML IV SOLN
INTRAVENOUS | Status: DC | PRN
  Administered 2015-08-03: 100 mg via INTRAVENOUS

## 2015-08-03 MED ORDER — EPINEPHRINE HCL 1 MG/ML IJ SOLN
INTRAMUSCULAR | Status: AC
  Filled 2015-08-03: qty 1

## 2015-08-03 MED ORDER — DEXAMETHASONE SODIUM PHOSPHATE 4 MG/ML IJ SOLN
INTRAMUSCULAR | Status: DC | PRN
  Administered 2015-08-03: 10 mg via INTRAVENOUS

## 2015-08-03 MED ORDER — OXYCODONE HCL 5 MG PO TABS
10.0000 mg | ORAL_TABLET | Freq: Once | ORAL | Status: AC
  Administered 2015-08-03: 10 mg via ORAL

## 2015-08-03 MED ORDER — MIDAZOLAM HCL 2 MG/2ML IJ SOLN
INTRAMUSCULAR | Status: AC
  Filled 2015-08-03: qty 2

## 2015-08-03 MED ORDER — PROMETHAZINE HCL 25 MG/ML IJ SOLN: 6.2500 mg | INTRAMUSCULAR | Status: DC | PRN

## 2015-08-03 MED ORDER — ONDANSETRON HCL 4 MG/2ML IJ SOLN
INTRAMUSCULAR | Status: DC | PRN
  Administered 2015-08-03: 4 mg via INTRAVENOUS

## 2015-08-03 MED ORDER — LIDOCAINE HCL (PF) 1 % IJ SOLN
INTRAMUSCULAR | Status: AC
  Filled 2015-08-03: qty 30

## 2015-08-03 MED ORDER — FENTANYL CITRATE (PF) 100 MCG/2ML IJ SOLN: 50.0000 ug | INTRAMUSCULAR | Status: DC | PRN

## 2015-08-03 SURGICAL SUPPLY — 82 items
APPLIER CLIP 9.375 MED OPEN (MISCELLANEOUS)
APR CLP MED 9.3 20 MLT OPN (MISCELLANEOUS)
BAG DECANTER FOR FLEXI CONT (MISCELLANEOUS) ×5 IMPLANT
BINDER ABDOMINAL 10 UNV 27-48 (MISCELLANEOUS) ×2 IMPLANT
BINDER ABDOMINAL 12 SM 30-45 (SOFTGOODS) IMPLANT
BINDER BREAST 3XL (BIND) IMPLANT
BINDER BREAST LRG (GAUZE/BANDAGES/DRESSINGS) IMPLANT
BINDER BREAST MEDIUM (GAUZE/BANDAGES/DRESSINGS) IMPLANT
BINDER BREAST XLRG (GAUZE/BANDAGES/DRESSINGS) IMPLANT
BINDER BREAST XXLRG (GAUZE/BANDAGES/DRESSINGS) ×2 IMPLANT
BLADE SURG 10 STRL SS (BLADE) ×18 IMPLANT
BLADE SURG 11 STRL SS (BLADE) ×5 IMPLANT
BNDG GAUZE ELAST 4 BULKY (GAUZE/BANDAGES/DRESSINGS) ×10 IMPLANT
CANISTER LIPO FAT HARVEST (MISCELLANEOUS) ×5 IMPLANT
CANISTER SUCT 1200ML W/VALVE (MISCELLANEOUS) ×5 IMPLANT
CHLORAPREP W/TINT 26ML (MISCELLANEOUS) ×7 IMPLANT
CLIP APPLIE 9.375 MED OPEN (MISCELLANEOUS) IMPLANT
COMPRESSION GARMENT LG MOREWEL (MISCELLANEOUS) IMPLANT
COMPRESSION GARMENT MD MOREWEL (MISCELLANEOUS) IMPLANT
COMPRESSION GARMENT XL MOREWEL (MISCELLANEOUS) IMPLANT
COVER MAYO STAND STRL (DRAPES) ×7 IMPLANT
DECANTER SPIKE VIAL GLASS SM (MISCELLANEOUS) IMPLANT
DRAIN CHANNEL 15F RND FF W/TCR (WOUND CARE) ×2 IMPLANT
DRAPE IMP U-DRAPE 54X76 (DRAPES) IMPLANT
DRSG PAD ABDOMINAL 8X10 ST (GAUZE/BANDAGES/DRESSINGS) ×10 IMPLANT
ELECT BLADE 4.0 EZ CLEAN MEGAD (MISCELLANEOUS) ×5
ELECT COATED BLADE 2.86 ST (ELECTRODE) ×5 IMPLANT
ELECT REM PT RETURN 9FT ADLT (ELECTROSURGICAL) ×5
ELECTRODE BLDE 4.0 EZ CLN MEGD (MISCELLANEOUS) ×3 IMPLANT
ELECTRODE REM PT RTRN 9FT ADLT (ELECTROSURGICAL) ×3 IMPLANT
EVACUATOR SILICONE 100CC (DRAIN) ×2 IMPLANT
FILTER LIPOSUCTION (MISCELLANEOUS) ×5 IMPLANT
GLOVE BIO SURGEON STRL SZ 6 (GLOVE) ×10 IMPLANT
GOWN STRL REUS W/ TWL LRG LVL3 (GOWN DISPOSABLE) ×6 IMPLANT
GOWN STRL REUS W/TWL LRG LVL3 (GOWN DISPOSABLE) ×15
IMPL BREAST SAL 550-590CC (Breast) IMPLANT
IMPLANT BREAST SAL 550-590CC (Breast) ×5 IMPLANT
IV NS 1000ML (IV SOLUTION) ×5
IV NS 1000ML BAXH (IV SOLUTION) IMPLANT
IV NS 500ML (IV SOLUTION)
IV NS 500ML BAXH (IV SOLUTION) IMPLANT
KIT FILL SYSTEM UNIVERSAL (SET/KITS/TRAYS/PACK) IMPLANT
LINER CANISTER 1000CC FLEX (MISCELLANEOUS) ×5 IMPLANT
LIQUID BAND (GAUZE/BANDAGES/DRESSINGS) ×6 IMPLANT
NATRELLE STERILE BREAST IMPLANT SIZER ×1 IMPLANT
NDL HYPO 25X1 1.5 SAFETY (NEEDLE) IMPLANT
NDL SAFETY ECLIPSE 18X1.5 (NEEDLE) ×3 IMPLANT
NEEDLE HYPO 18GX1.5 SHARP (NEEDLE)
NEEDLE HYPO 25X1 1.5 SAFETY (NEEDLE) IMPLANT
NS IRRIG 1000ML POUR BTL (IV SOLUTION) ×7 IMPLANT
PACK BASIN DAY SURGERY FS (CUSTOM PROCEDURE TRAY) ×5 IMPLANT
PACK UNIVERSAL I (CUSTOM PROCEDURE TRAY) ×5 IMPLANT
PAD ALCOHOL SWAB (MISCELLANEOUS) ×5 IMPLANT
PENCIL BUTTON HOLSTER BLD 10FT (ELECTRODE) ×5 IMPLANT
PIN SAFETY STERILE (MISCELLANEOUS) ×3 IMPLANT
SHEET MEDIUM DRAPE 40X70 STRL (DRAPES) ×10 IMPLANT
SIZER BREAST SGL USE HP 550CC (Breast) ×5 IMPLANT
SIZER BRST SGL USE HP 550CC (Breast) IMPLANT
SLEEVE SCD COMPRESS KNEE MED (MISCELLANEOUS) ×5 IMPLANT
SPONGE LAP 18X18 X RAY DECT (DISPOSABLE) ×10 IMPLANT
STAPLER VISISTAT 35W (STAPLE) ×8 IMPLANT
SUT ETHILON 2 0 FS 18 (SUTURE) ×8 IMPLANT
SUT MNCRL AB 4-0 PS2 18 (SUTURE) ×18 IMPLANT
SUT PDS AB 2-0 CT2 27 (SUTURE) IMPLANT
SUT VIC AB 3-0 PS1 18 (SUTURE) ×15
SUT VIC AB 3-0 PS1 18XBRD (SUTURE) ×12 IMPLANT
SUT VIC AB 3-0 SH 27 (SUTURE) ×5
SUT VIC AB 3-0 SH 27X BRD (SUTURE) ×6 IMPLANT
SUT VICRYL 4-0 PS2 18IN ABS (SUTURE) ×8 IMPLANT
SYR 50ML LL SCALE MARK (SYRINGE) ×10 IMPLANT
SYR BULB IRRIGATION 50ML (SYRINGE) ×5 IMPLANT
SYR CONTROL 10ML LL (SYRINGE) IMPLANT
SYR TB 1ML LL NO SAFETY (SYRINGE) ×5 IMPLANT
SYRINGE 10CC LL (SYRINGE) ×9 IMPLANT
TAPE MEASURE VINYL STERILE (MISCELLANEOUS) IMPLANT
TOWEL OR 17X24 6PK STRL BLUE (TOWEL DISPOSABLE) ×10 IMPLANT
TUBE CONNECTING 20'X1/4 (TUBING) ×2
TUBE CONNECTING 20X1/4 (TUBING) ×5 IMPLANT
TUBING INFILTRATION IT-10001 (TUBING) ×5 IMPLANT
TUBING SET GRADUATE ASPIR 12FT (MISCELLANEOUS) ×5 IMPLANT
UNDERPAD 30X30 (UNDERPADS AND DIAPERS) ×10 IMPLANT
YANKAUER SUCT BULB TIP NO VENT (SUCTIONS) ×5 IMPLANT

## 2015-08-03 NOTE — Op Note (Addendum)
Operative Note   DATE OF OPERATION: 7.31.17  LOCATION: [facility]-outpatient  SURGICAL DIVISION: Plastic Surgery  PREOPERATIVE DIAGNOSES:  1. History right breast cancer 2. Acquired absence breast 3. Asymmetry native and reconstructed breasts 4. History atypical ductal hyperplasia left breast  POSTOPERATIVE DIAGNOSES:  same  PROCEDURE: 1. Removal right tissue expander and placement saline implant 2. Lipofilling to right chest 3. Left breast reduction  SURGEON: Irene Limbo MD MBA  ASSISTANT: none  ANESTHESIA:  General.   [id number] ml  COMPLICATIONS: None immediate.   INDICATIONS FOR PROCEDURE:  The patient, Brooke [name], is a 50 y.o. female born on 1965-08-12, is here for second stage reconstruction following immediate reconstruction right chest with tissue expander. Of note, at time of mastectomy she underwent lumpectomy of left breast for area of ADH on biopsy with final resection no further abnormality.   FINDINGS: Natrelle Smooth Round High Profile Saline implant 550 ml, fill volume 590 ml right chest. REF [id number]. Left breast reduction 450 g Total 85 ml fat infiltrated over right chest.  DESCRIPTION OF PROCEDURE:  The patient was marked with the patient in the preoperative area in standing position to mark areas over flanks for fat harvest, chest midline, sternal notch, breast meridians, inframammary folds. The location for nipple areolar complex marked on anterior surface left breast by palpation. With aid of Wise pattern marker, location of new nipple areolar complex and vertical limbs (8 cm) were marked. Redundant soft tissue over medial right chest marked for excision. The patient was taken to the operating room. SCDs were placed and IV antibiotics were given. The patient's operative site was prepped and draped in a sterile fashion. A time out was performed and all information was confirmed to be correct.   Over left breast, superomedial pedicle marked  and nipple areolar complex marked with 42 mm diameter marker. Pedicle developed to chest wall and to allow tension free rotation superiorly. Skin and breast tissue excised over lower poles. Breast tailor tacked closed.    I then directed attention to right breast. Incision made through prior transverse scar and capsule entered. Area of redundant soft tisse medially excised sharply and sent as specimen. Tissue expander removed, and acellular dermis noted to be well incorporated. Capsulotomies performed superiorly and anteriorly. Submuscular dissection completed toward clavicle. Patient brought to upright sitting position and various sizers tried. Final implant 550 ml filled to 590 ml saline high profile selected.   Tumescent solution infiltrated over bilateral flanks utilizing prior scars. Total of 275 ml fluid infiltrated. Power assisted liposuction completed over bilateral flanks. Fat prepared for infiltration with washing and gravity. Stab incisions approximated with interrupted 4-0 monocryl.  Using blunt canula, prepared fat placed subcutaneously and intramuscular over superior poles and to reconstruct lateral axillary tail breast. Breast cavity irrigated with solution containing ancef, bacitracin, and gentamicin. Implant placed and layered closure completed with 3-0 vicryl to approximate pectoralis muscle. 4-0 vicryl placed in dermis followed by running 4-0 monocryl subcuticular for skin closure over right breast. Over left breast, closure completed with 3-0 vicryl approximate dermis along inframammary fold and vertical limb. NAC inset with 4-0 vicryl in dermis. Skin closure completed with 4-0 monocryl subcuticular.Tissue adhesive, followed by dry dressing and breast and abdominal binders applied.  The patient was allowed to wake from anesthesia, extubated and taken to the recovery room in satisfactory condition.   SPECIMENS:1. Right mastectomy flap 2. Left breast reduction  DRAINS: 15 Fr JP in  left breast  Irene Limbo, MD MBA  Plastic & Reconstructive Surgery [phone], pin [phone]

## 2015-08-03 NOTE — Anesthesia Preprocedure Evaluation (Addendum)
Anesthesia Evaluation  Patient identified by MRN, date of birth, ID band Patient awake    Reviewed: Allergy & Precautions, NPO status , Patient's Chart, lab work & pertinent test results  History of Anesthesia Complications (+) history of anesthetic complications  Airway Mallampati: II   Neck ROM: full    Dental   Pulmonary neg pulmonary ROS,    breath sounds clear to auscultation       Cardiovascular negative cardio ROS   Rhythm:regular Rate:Normal     Neuro/Psych  Headaches, Anxiety Depression    GI/Hepatic   Endo/Other  obese  Renal/GU      Musculoskeletal   Abdominal   Peds  Hematology   Anesthesia Other Findings   Reproductive/Obstetrics                            Anesthesia Physical  Anesthesia Plan  ASA: II  Anesthesia Plan: General   Post-op Pain Management:  Regional for Post-op pain   Induction: Intravenous  Airway Management Planned: Oral ETT  Additional Equipment:   Intra-op Plan:   Post-operative Plan:   Informed Consent: I have reviewed the patients History and Physical, chart, labs and discussed the procedure including the risks, benefits and alternatives for the proposed anesthesia with the patient or authorized representative who has indicated his/her understanding and acceptance.     Plan Discussed with: CRNA, Anesthesiologist and Surgeon  Anesthesia Plan Comments:         Anesthesia Quick Evaluation

## 2015-08-03 NOTE — Discharge Instructions (Signed)
About my Jackson-Pratt Bulb Drain ° °What is a Jackson-Pratt bulb? °A Jackson-Pratt is a soft, round device used to collect drainage. It is connected to a long, thin drainage catheter, which is held in place by one or two small stiches near your surgical incision site. When the bulb is squeezed, it forms a vacuum, forcing the drainage to empty into the bulb. ° °Emptying the Jackson-Pratt bulb- °To empty the bulb: °1. Release the plug on the top of the bulb. °2. Pour the bulb's contents into a measuring container which your nurse will provide. °3. Record the time emptied and amount of drainage. Empty the drain(s) as often as your     doctor or nurse recommends. ° °Date                  Time                    Amount (Drain 1)                 Amount (Drain 2) ° °_____________________________________________________________________ ° °_____________________________________________________________________ ° °_____________________________________________________________________ ° °_____________________________________________________________________ ° °_____________________________________________________________________ ° °_____________________________________________________________________ ° °_____________________________________________________________________ ° °_____________________________________________________________________ ° °Squeezing the Jackson-Pratt Bulb- °To squeeze the bulb: °1. Make sure the plug at the top of the bulb is open. °2. Squeeze the bulb tightly in your fist. You will hear air squeezing from the bulb. °3. Replace the plug while the bulb is squeezed. °4. Use a safety pin to attach the bulb to your clothing. This will keep the catheter from     pulling at the bulb insertion site. ° °When to call your doctor- °Call your doctor if: °· Drain site becomes red, swollen or hot. °· You have a fever greater than 101 degrees F. °· There is oozing at the drain site. °· Drain falls out (apply a guaze  bandage over the drain hole and secure it with tape). °· Drainage increases daily not related to activity patterns. (You will usually have more drainage when you are active than when you are resting.) °· Drainage has a bad odor. ° ° °Post Anesthesia Home Care Instructions ° °Activity: °Get plenty of rest for the remainder of the day. A responsible adult should stay with you for 24 hours following the procedure.  °For the next 24 hours, DO NOT: °-Drive a car °-Operate machinery °-Drink alcoholic beverages °-Take any medication unless instructed by your physician °-Make any legal decisions or sign important papers. ° °Meals: °Start with liquid foods such as gelatin or soup. Progress to regular foods as tolerated. Avoid greasy, spicy, heavy foods. If nausea and/or vomiting occur, drink only clear liquids until the nausea and/or vomiting subsides. Call your physician if vomiting continues. ° °Special Instructions/Symptoms: °Your throat may feel dry or sore from the anesthesia or the breathing tube placed in your throat during surgery. If this causes discomfort, gargle with warm salt water. The discomfort should disappear within 24 hours. ° °If you had a scopolamine patch placed behind your ear for the management of post- operative nausea and/or vomiting: ° °1. The medication in the patch is effective for 72 hours, after which it should be removed.  Wrap patch in a tissue and discard in the trash. Wash hands thoroughly with soap and water. °2. You may remove the patch earlier than 72 hours if you experience unpleasant side effects which may include dry mouth, dizziness or visual disturbances. °3. Avoid touching the patch. Wash your hands with soap and water after contact with the   patch. °  ° ° °

## 2015-08-03 NOTE — Transfer of Care (Signed)
Immediate Anesthesia Transfer of Care Note  Patient: KAZARIA MCKELL  Procedure(s) Performed: Procedure(s): REMOVAL OF RIGHT TISSUE EXPANDERS WITH PLACEMENT OF IMPLANT (Right) LEFT BREAST REDUCTION FOR SYMMETRY  (BREAST) (Left) LIPOSUCTION WITH LIPOFILLING  (N/A)  Patient Location: PACU  Anesthesia Type:General  Level of Consciousness: awake, alert  and oriented  Airway & Oxygen Therapy: Patient Spontanous Breathing and Patient connected to face mask oxygen  Post-op Assessment: Report given to RN and Post -op Vital signs reviewed and stable  Post vital signs: Reviewed and stable  Last Vitals:  Vitals:   08/03/15 1116  BP: 128/81  Pulse: 63  Resp: 20  Temp: 37 C    Last Pain: There were no vitals filed for this visit.       Complications: No apparent anesthesia complications

## 2015-08-03 NOTE — Interval H&P Note (Signed)
History and Physical Interval Note:  08/03/2015 11:01 AM  Brooke [name]  has presented today for surgery, with the diagnosis of HX OF BREAST CANCER ACQUIRED ABCESS OF BREAST ASYMETRY NATIVE AND RECONSTUCTED BREAST   The various methods of treatment have been discussed with the patient and family. After consideration of risks, benefits and other options for treatment, the patient has consented to  Procedure(s): REMOVAL OF RIGHT TISSUE EXPANDERS WITH PLACEMENT OF IMPLANT (Right) LEFT BREAST REDUCTION FOR SYMMETRY  (BREAST) (Left) POSSIBLE  LIPOFILLING (N/A) as a surgical intervention .  The patient's history has been reviewed, patient examined, no change in status, stable for surgery.  I have reviewed the patient's chart and labs.  Questions were answered to the patient's satisfaction.     Kerrion Kemppainen

## 2015-08-03 NOTE — Anesthesia Procedure Notes (Signed)
Procedure Name: Intubation Date/Time: 08/03/2015 12:32 PM Performed by: Maryella Shivers Pre-anesthesia Checklist: Patient identified, Emergency Drugs available, Suction available and Patient being monitored Patient Re-evaluated:Patient Re-evaluated prior to inductionOxygen Delivery Method: Circle system utilized Preoxygenation: Pre-oxygenation with 100% oxygen Intubation Type: IV induction Ventilation: Mask ventilation without difficulty Laryngoscope Size: Mac and 4 Grade View: Grade I Tube type: Oral Tube size: 7.0 mm Number of attempts: 1 Airway Equipment and Method: Stylet and Oral airway Placement Confirmation: ETT inserted through vocal cords under direct vision,  positive ETCO2 and breath sounds checked- equal and bilateral Secured at: 21 cm Tube secured with: Tape Dental Injury: Teeth and Oropharynx as per pre-operative assessment

## 2015-08-03 NOTE — Anesthesia Postprocedure Evaluation (Signed)
Anesthesia Post Note  Patient: Brooke [name]  Procedure(s) Performed: Procedure(s) (LRB): REMOVAL OF RIGHT TISSUE EXPANDERS WITH PLACEMENT OF IMPLANT (Right) LEFT BREAST REDUCTION FOR SYMMETRY  (BREAST) (Left) LIPOSUCTION WITH LIPOFILLING  (N/A)  Patient location during evaluation: PACU Anesthesia Type: General Level of consciousness: awake and alert Pain management: pain level controlled Vital Signs Assessment: post-procedure vital signs reviewed and stable Respiratory status: spontaneous breathing, nonlabored ventilation, respiratory function stable and patient connected to nasal cannula oxygen Cardiovascular status: blood pressure returned to baseline and stable Postop Assessment: no signs of nausea or vomiting Anesthetic complications: no    Last Vitals:  Vitals:   08/03/15 1116  BP: 128/81  Pulse: 63  Resp: 20  Temp: 37 C    Last Pain: There were no vitals filed for this visit.               Zenaida Deed

## 2015-08-04 MED ORDER — NEOSTIGMINE METHYLSULFATE 10 MG/10ML IV SOLN
INTRAVENOUS | Status: DC | PRN
  Administered 2015-08-03: 2 mg via INTRAVENOUS

## 2015-08-04 MED ORDER — GLYCOPYRROLATE 0.2 MG/ML IJ SOLN
INTRAMUSCULAR | Status: DC | PRN
  Administered 2015-08-03: .2 mg via INTRAVENOUS

## 2015-08-05 ENCOUNTER — Encounter (HOSPITAL_BASED_OUTPATIENT_CLINIC_OR_DEPARTMENT_OTHER): Payer: Self-pay | Admitting: Plastic Surgery

## 2015-08-17 ENCOUNTER — Other Ambulatory Visit: Payer: Self-pay | Admitting: *Deleted

## 2015-08-17 DIAGNOSIS — C50211 Malignant neoplasm of upper-inner quadrant of right female breast: Secondary | ICD-10-CM

## 2015-08-17 MED ORDER — PROCHLORPERAZINE MALEATE 10 MG PO TABS: ORAL_TABLET | ORAL | 0 refills | Status: DC

## 2015-11-06 ENCOUNTER — Other Ambulatory Visit: Payer: Self-pay | Admitting: *Deleted

## 2015-11-06 DIAGNOSIS — C50211 Malignant neoplasm of upper-inner quadrant of right female breast: Secondary | ICD-10-CM

## 2015-11-06 MED ORDER — VENLAFAXINE HCL 37.5 MG PO TABS: 37.5000 mg | ORAL_TABLET | Freq: Every day | ORAL | 3 refills | Status: DC

## 2016-02-29 ENCOUNTER — Other Ambulatory Visit: Payer: BC Managed Care – PPO

## 2016-02-29 ENCOUNTER — Ambulatory Visit: Payer: BC Managed Care – PPO | Admitting: Oncology

## 2016-02-29 NOTE — Progress Notes (Deleted)
[place]  [phone] [phone]) [phone]     ID: [name] DOB: Nov 19, 1965  MR#: [id number]  YJE#:[id number]  Patient Care Team: Eldred Manges, MD as PCP - General (Obstetrics and Gynecology) Chauncey Cruel, MD as Consulting Physician (Oncology) Thea Silversmith, MD (Inactive) as Consulting Physician (Radiation Oncology) Alphonsa Overall, MD as Consulting Physician (General Surgery) Sylvan Cheese, NP as Nurse Practitioner (Hematology and Oncology) Irene Limbo, MD as Consulting Physician (Plastic Surgery) PCP: Eldred Manges, MD OTHER MD: [facility]  CHIEF COMPLAINT: Estrogen receptor positive breast cancer  CURRENT TREATMENT: Tamoxifen   BREAST CANCER HISTORY: From the original intake note:  Brooke [name] had routine gynecologic follow-up with Dr. Caralee Ates office and as part of that exam she had screening bilateral mammography which showed a possible mass and calcifications in the right breast. She was referred to the [facility] where on 02/12/2015 she had a right diagnostic mammography with tomo synthesis and right breast ultrasonography. In the upper inner quadrant of the right breast there was a spiculated mass measuring approximately 1.5 cm. Posterior to this mass, approximately 5.5 cm away, there was a subtle area of pleomorphic calcifications spanning 1 cm. By physical exam the upper inner quadrant right breast mass was palpable 2-3 cm from the nipple. By ultrasound this proved to be irregular and heterogeneous and measuring approximately 2.2 cm. The right axilla was benign.  On 02/16/2015 the patient underwent biopsy of the right breast mass in question and also a more posterior area of calcifications, not seen on the original mammogram, 10 cm away from the biopsied mass. 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 Biopsy of the more posterior area of calcifications also showed invasive as well as in situ ductal carcinoma, grade 1 or 2, estrogen receptor 95% positive, with strong staining intensity, progesterone receptor 45% positive, with moderate staining intensity, with an MIB-1 of 25%, and HER-2 also negative, with a signals ratio of 1.48 and the number per cell being 2.00.  The patient's subsequent history is as detailed below  INTERVAL HISTORY: [name] returns today for follow-up of her estrogen receptor positive breast cancer. Since her last visit with me she underwent definitive saline implant placement on the right and left mammoplasty on the    returns today for follow-up of her estrogen receptor positive breast cancer.  She was having nausea with Tamoxifen, but went to [facility] and someone suggested she take tamoxifen at bedtime. That took care of that problem  REVIEW OF SYSTEMS: Neosha continues to feel tired, but she tells me she walks 4 miles everyday, which is terrific. She still having hot flashes. They can wake her up at night. She is taking venlafaxine 37.5 mg daily. She has no side effects from this but also no benefit. Aside from these issues a detailed review of systems today was noncontributory  PAST MEDICAL HISTORY: Past Medical History:  Diagnosis Date  . Anemia   . Anxiety   . Breast cancer of upper-inner quadrant of right female breast ([place]) 02/18/2015  . Chronic headaches   . Depression   . Fatigue   . Hot flashes   . Yeast infection     PAST SURGICAL HISTORY: Past Surgical History:  Procedure Laterality Date  .  BREAST LUMPECTOMY WITH RADIOACTIVE SEED LOCALIZATION Left 04/06/2015  . BREAST LUMPECTOMY WITH RADIOACTIVE SEED LOCALIZATION Left 04/06/2015   Procedure: LEFT BREAST LUMPECTOMY WITH RADIOACTIVE SEED LOCALIZATION;   Surgeon: Alphonsa Overall, MD;  Location: [facility];  Service: General;  Laterality: Left;  . BREAST RECONSTRUCTION WITH PLACEMENT OF TISSUE EXPANDER AND FLEX HD (ACELLULAR HYDRATED DERMIS) Right 04/06/2015   Procedure: RIGHT BREAST RECONSTRUCTION WITH PLACEMENT OF TISSUE EXPANDER, POSSIBLE ACELLULAR DERMIS;  Surgeon: Irene Limbo, MD;  Location: [place] [place];  Service: Plastics;  Laterality: Right;  . BREAST REDUCTION SURGERY Left 08/03/2015   Procedure: LEFT BREAST REDUCTION FOR SYMMETRY  (BREAST);  Surgeon: Irene Limbo, MD;  Location: [facility];  Service: Plastics;  Laterality: Left;  . CHOLECYSTECTOMY    . GASTRIC BYPASS    . LIPOSUCTION WITH LIPOFILLING N/A 08/03/2015   Procedure: LIPOSUCTION WITH LIPOFILLING ;  Surgeon: Irene Limbo, MD;  Location: [facility];  Service: Plastics;  Laterality: N/A;  . MASTECTOMY W/ SENTINEL NODE BIOPSY Right   . MASTECTOMY W/ SENTINEL NODE BIOPSY Right 04/06/2015   Procedure: RIGHT MASTECTOMY WITH SENTINEL LYMPH NODE BIOPSY;  Surgeon: Alphonsa Overall, MD;  Location: [place];  Service: General;  Laterality: Right;  . REMOVAL OF BILATERAL TISSUE EXPANDERS WITH PLACEMENT OF BILATERAL BREAST IMPLANTS Right 08/03/2015   Procedure: REMOVAL OF RIGHT TISSUE EXPANDERS WITH PLACEMENT OF IMPLANT;  Surgeon: Irene Limbo, MD;  Location: [facility];  Service: Plastics;  Laterality: Right;  . TUBAL LIGATION      FAMILY HISTORY Family History  Problem Relation Age of Onset  . Diabetes Mother   . Hypertension Mother   . Spinal muscular atrophy Mother   . Irregular heart beat Mother   . Anxiety disorder Mother   . Arthritis Mother   . Cancer Other     maternal great grandfather dx. unspecified type cancer  . Heart attack Maternal Grandmother   . Heart attack Maternal Uncle   . Cancer Cousin     paternal 1st cousin dx. with cancer that was typically a childhood cancer   the patient has little information on her father's side of  the family. Her mother died of complications of nonalcoholic cirrhosis of the age of 64. The patient had 4 brothers, 2 sisters. A maternal great-grandfather reportedly had cancer of some type, but she has no further information.  GYNECOLOGIC HISTORY:  No LMP recorded.  menarche age 51, first live birth age 33, the patient is [place] P2. She has periods fairly irregularly at present and they last about 3 days. She used Depo Provera last in 2000.   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 Her husband [name] is a Customer service manager. Son avier (pronounced "X--avier") is a Environmental consultant at the [employer] in [place]. Daughter [name] 16 is at home with the patient. There are no grandchildren. The patient attends the [employer] church in [place]: not in place    HEALTH MAINTENANCE: Social History  Substance Use Topics  . Smoking status: Never Smoker  . Smokeless tobacco: Never Used  . Alcohol use 2.0 - 2.5 oz/week    4 - 5 Standard drinks or equivalent per week     Comment: wine and beer on weekends     Colonoscopy: never   PAP: February 2017  Bone density: never   Lipid panel:  No Known Allergies  Current Outpatient Prescriptions  Medication Sig Dispense Refill  . methocarbamol (ROBAXIN)  500 MG tablet Take 1 tablet (500 mg total) by mouth every 8 (eight) hours as needed for muscle spasms. 30 tablet 0  . Multiple Vitamins-Minerals (MULTIVITAMIN ADULT PO) Take 1 tablet by mouth daily.     Marland Kitchen oxyCODONE (ROXICODONE) 5 MG immediate release tablet Take 1-2 tablets (5-10 mg total) by mouth every 4 (four) hours as needed for severe pain. 50 tablet 0  . prochlorperazine (COMPAZINE) 10 MG tablet Take one tablet three times daily as needed for nausea. 30 tablet 0  . sulfamethoxazole-trimethoprim (BACTRIM DS,SEPTRA DS) 800-160 MG tablet Take 1 tablet by mouth 2 (two) times daily. 14 tablet 0  . tamoxifen (NOLVADEX) 20 MG tablet Take 1 tablet (20 mg total)  by mouth daily. 90 tablet 4  . valACYclovir (VALTREX) 500 MG tablet Take 500 mg by mouth daily.    Marland Kitchen venlafaxine (EFFEXOR) 37.5 MG tablet Take 1-2 tablets (37.5-75 mg total) by mouth daily. 60 tablet 3   No current facility-administered medications for this visit.     OBJECTIVE: middle-aged African-American woman In no acute distress  There were no vitals filed for this visit.   There is no height or weight on file to calculate BMI.    ECOG FS:1 - Symptomatic but completely ambulatory  Sclerae unicteric, pupils round and equal Oropharynx clear and moist-- no thrush or other lesions No cervical or supraclavicular adenopathy Lungs no rales or rhonchi Heart regular rate and rhythm Abd soft, nontender, positive bowel sounds MSK no focal spinal tenderness, no upper extremity lymphedema Neuro: nonfocal, well oriented, appropriate affect Breasts: Deferred   LAB RESULTS:  CMP     Component Value Date/Time   NA 139 07/20/2015 1508   K 4.0 07/20/2015 1508   CL 111 03/30/2015 0823   CO2 24 07/20/2015 1508   GLUCOSE 99 07/20/2015 1508   BUN 17.4 07/20/2015 1508   CREATININE 0.8 07/20/2015 1508   CALCIUM 8.3 (L) 07/20/2015 1508   PROT 6.7 07/20/2015 1508   ALBUMIN 3.5 07/20/2015 1508   AST 19 07/20/2015 1508   ALT 14 07/20/2015 1508   ALKPHOS 59 07/20/2015 1508   BILITOT <0.30 07/20/2015 1508   GFRNONAA >60 03/30/2015 0823   GFRNONAA >89 03/14/2014 1354   GFRAA >60 03/30/2015 0823   GFRAA >89 03/14/2014 1354    INo results found for: SPEP, UPEP  Lab Results  Component Value Date   WBC 6.0 07/20/2015   NEUTROABS 2.6 07/20/2015   HGB 11.2 (L) 07/20/2015   HCT 34.3 (L) 07/20/2015   MCV 94.6 07/20/2015   PLT 263 07/20/2015      Chemistry      Component Value Date/Time   NA 139 07/20/2015 1508   K 4.0 07/20/2015 1508   CL 111 03/30/2015 0823   CO2 24 07/20/2015 1508   BUN 17.4 07/20/2015 1508   CREATININE 0.8 07/20/2015 1508      Component Value Date/Time   CALCIUM  8.3 (L) 07/20/2015 1508   ALKPHOS 59 07/20/2015 1508   AST 19 07/20/2015 1508   ALT 14 07/20/2015 1508   BILITOT <0.30 07/20/2015 1508       No results found for: LABCA2  No components found for: LABCA125  No results for input(s): INR in the last 168 hours.  Urinalysis    Component Value Date/Time   BILIRUBINUR neg 05/03/2012 0954   PROTEINUR neg 05/03/2012 0954   UROBILINOGEN 0.2 05/03/2012 0954   NITRITE positive 05/03/2012 0954   LEUKOCYTESUR Trace 05/03/2012 1025  ELIGIBLE FOR AVAILABLE RESEARCH PROTOCOL: no  STUDIES: No results found.  ASSESSMENT: 51 y.o. [place] woman s/p biopsy 02/17/2015 of 2 places in her right breastUpper inner quadrant, both showing invasive ductal carcinoma, clinically T2 NX, stage II, estrogen receptor and progesterone receptor strongly positive, HER-2/neu nonamplified, with an MIB-1 of 25%  (1) genetics testing 03/06/2015 through theBreast/Ovarian Cancer Panel offered by[facility]x Laboratoriesfound no deleterious mutations in ATM, BARD1, BRCA1, BRCA2, BRIP1, CDH1, CHEK2, FANCC, MLH1, MSH2, MSH6, NBN, PALB2, PMS2, PTEN, RAD51C, RAD51D, TP53, and XRCC2. This panel also includes deletion/duplication analysis (without sequencing) for one gene, EPCAM.   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    (2) Oncotype DX score of 5 predicts an outside the breast risk of recurrence within 10 years of 5% if the patient's only systemic therapy is tamoxifen for 5 years. It also predicts no benefit from chemotherapy  (3) left breast biopsy 03/10/2015 shows an area of atypical ductal hyperplasia  (a) left lumpectomy for 03/23/2015 showed only the usual ductal hyperplasia, no atypia or tumor  (4) right mastectomy with sentinel lymph node sampling 04/06/2015 shows an mpT1C pN0, stage IA invasive ductal carcinoma, grade 2 with negative margins repeat HER-2 (2) again  negative  (a) definitive implant placement 08/03/2015, with benign pathology  (5) adjuvant radiation not indicated  (6) tamoxifen started neoadjuvantly on 02/25/2015    PLAN: Carolanne is tolerating tamoxifen well, now that she is taking it at bedtime so she no longer has nausea.  While she complains of fatigue, I think that must be residual from her earlier treatments. In any case she is doing exactly the right thing, which is to walk several miles every day.  She is trying to lose weight. She is actually on a pretty good diet by her own description. We discussed going off carbohydrates as the chief when a of at least not gaining if not actually losing weight.  As far as the hot flashes are concerned she is can double up on the venlafaxine to 75 mg for a couple of weeks and if she finds that is helpful she will let us know and we will write her for 75 mg tablet.  Otherwise the plan is to continue on tamoxifen for at least 2 years after which we could consider switching to an aromatase inhibitor. She will see me again in February 2018 after her next mammogram.  I encouraged her to participate in the "finding your new normal" program here  She knows to call for any problems that may develop before the next visit.  Chauncey Cruel, MD   02/29/2016 8:01 AM Medical Oncology and Hematology [facility] [address] [place], [place] [address] Tel. [phone]    Fax. [phone]

## 2016-03-14 ENCOUNTER — Telehealth: Payer: Self-pay | Admitting: Oncology

## 2016-03-14 NOTE — Telephone Encounter (Signed)
Pt called to r/s appt to 4/26 at 9 am

## 2016-03-31 ENCOUNTER — Telehealth: Payer: Self-pay | Admitting: Oncology

## 2016-03-31 NOTE — Telephone Encounter (Signed)
Returned call to pt to inform of r/s 4/26 appt. r/s appt to 4/16 at 1030 am per LOS

## 2016-04-06 ENCOUNTER — Ambulatory Visit (HOSPITAL_BASED_OUTPATIENT_CLINIC_OR_DEPARTMENT_OTHER): Payer: BC Managed Care – PPO | Admitting: Oncology

## 2016-04-06 ENCOUNTER — Other Ambulatory Visit: Payer: Self-pay | Admitting: Oncology

## 2016-04-06 VITALS — BP 136/86 | HR 70 | Temp 97.6°F | Resp 25 | Ht 71.0 in | Wt 234.8 lb

## 2016-04-06 DIAGNOSIS — Z79810 Long term (current) use of selective estrogen receptor modulators (SERMs): Secondary | ICD-10-CM

## 2016-04-06 DIAGNOSIS — C50211 Malignant neoplasm of upper-inner quadrant of right female breast: Secondary | ICD-10-CM

## 2016-04-06 DIAGNOSIS — Z170 Estrogen receptor positive status [ER+]: Secondary | ICD-10-CM | POA: Diagnosis not present

## 2016-04-06 MED ORDER — PROCHLORPERAZINE MALEATE 10 MG PO TABS: ORAL_TABLET | ORAL | 0 refills | Status: DC

## 2016-04-06 MED ORDER — TAMOXIFEN CITRATE 20 MG PO TABS
20.0000 mg | ORAL_TABLET | Freq: Every day | ORAL | 4 refills | Status: DC
Start: 2016-04-06 — End: 2017-04-25

## 2016-04-06 MED ORDER — VENLAFAXINE HCL 75 MG PO TABS: 75.0000 mg | ORAL_TABLET | Freq: Every day | ORAL | 4 refills | Status: DC

## 2016-04-06 NOTE — Progress Notes (Signed)
[place]  [phone] Fax:([phone]) [phone]     ID: [name] DOB: 01/17/1965  MR#: [id number]  OMB#:[id number]  Patient Care Team: Eldred Manges, MD as PCP - General (Obstetrics and Gynecology) Chauncey Cruel, MD as Consulting Physician (Oncology) Thea Silversmith, MD (Inactive) as Consulting Physician (Radiation Oncology) Alphonsa Overall, MD as Consulting Physician (General Surgery) Sylvan Cheese, NP as Nurse Practitioner (Hematology and Oncology) Irene Limbo, MD as Consulting Physician (Plastic Surgery) PCP: Eldred Manges, MD OTHER MD: [facility]  CHIEF COMPLAINT: Estrogen receptor positive breast cancer  CURRENT TREATMENT: Tamoxifen   BREAST CANCER HISTORY: From the original intake note:  Brooke [name] had routine gynecologic follow-up with Dr. Caralee Ates office and as part of that exam she had screening bilateral mammography which showed a possible mass and calcifications in the right breast. She was referred to the [facility] where on 02/12/2015 she had a right diagnostic mammography with tomo synthesis and right breast ultrasonography. In the upper inner quadrant of the right breast there was a spiculated mass measuring approximately 1.5 cm. Posterior to this mass, approximately 5.5 cm away, there was a subtle area of pleomorphic calcifications spanning 1 cm. By physical exam the upper inner quadrant right breast mass was palpable 2-3 cm from the nipple. By ultrasound this proved to be irregular and heterogeneous and measuring approximately 2.2 cm. The right axilla was benign.  On 02/16/2015 the patient underwent biopsy of the right breast mass in question and also a more posterior area of calcifications, not seen on the original mammogram, 10 cm away from the biopsied mass. 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 Biopsy of the more posterior area of calcifications also showed invasive as well as in situ ductal carcinoma, grade 1 or 2, estrogen receptor 95% positive, with strong staining intensity, progesterone receptor 45% positive, with moderate staining intensity, with an MIB-1 of 25%, and HER-2 also negative, with a signals ratio of 1.48 and the number per cell being 2.00.  The patient's subsequent history is as detailed below  INTERVAL HISTORY: Brooke [name] returns today for follow-up of her estrogen receptor positive breast cancer. She continues on tamoxifen. She does have hot flashes from this medication, and she is on a very low dose of venlafaxine which may be helping a little. Quite aside from that she has nausea associated with the tamoxifen. This is very intermittent. It may not be related to the medication. She also complains of feeling tired all the time and depressed. She is dissatisfied with her reconstruction and because her breasts are sensitive and somewhat sore. "chills the sex drive". She tells me she stopped having periods in year ago. 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 She denies unusual headaches, visual changes, dizziness, or gait imbalance. She denies any cough or phlegm production. A detailed review of systems today was otherwise stable   PAST MEDICAL HISTORY: Past Medical History:  Diagnosis Date  . Anemia   . Anxiety   . Breast cancer of upper-inner quadrant of right female breast ([name]) 02/18/2015  . Chronic headaches   . Depression   . Fatigue   . Hot flashes   . Yeast infection     PAST SURGICAL HISTORY:  Past Surgical History:  Procedure Laterality Date  . BREAST LUMPECTOMY WITH RADIOACTIVE SEED LOCALIZATION Left 04/06/2015  . BREAST LUMPECTOMY WITH RADIOACTIVE SEED LOCALIZATION Left  04/06/2015   Procedure: LEFT BREAST LUMPECTOMY WITH RADIOACTIVE SEED LOCALIZATION;  Surgeon: Alphonsa Overall, MD;  Location: [facility] [place];  Service: General;  Laterality: Left;  . BREAST RECONSTRUCTION WITH PLACEMENT OF TISSUE EXPANDER AND FLEX HD (ACELLULAR HYDRATED DERMIS) Right 04/06/2015   Procedure: RIGHT BREAST RECONSTRUCTION WITH PLACEMENT OF TISSUE EXPANDER, POSSIBLE ACELLULAR DERMIS;  Surgeon: Irene Limbo, MD;  Location: [facility] [place];  Service: Plastics;  Laterality: Right;  . BREAST REDUCTION SURGERY Left 08/03/2015   Procedure: LEFT BREAST REDUCTION FOR SYMMETRY  (BREAST);  Surgeon: Irene Limbo, MD;  Location: [place];  Service: Plastics;  Laterality: Left;  . CHOLECYSTECTOMY    . GASTRIC BYPASS    . LIPOSUCTION WITH LIPOFILLING N/A 08/03/2015   Procedure: LIPOSUCTION WITH LIPOFILLING ;  Surgeon: Irene Limbo, MD;  Location: [facility];  Service: Plastics;  Laterality: N/A;  . MASTECTOMY W/ SENTINEL NODE BIOPSY Right   . MASTECTOMY W/ SENTINEL NODE BIOPSY Right 04/06/2015   Procedure: RIGHT MASTECTOMY WITH SENTINEL LYMPH NODE BIOPSY;  Surgeon: Alphonsa Overall, MD;  Location: [facility] [place];  Service: General;  Laterality: Right;  . REMOVAL OF BILATERAL TISSUE EXPANDERS WITH PLACEMENT OF BILATERAL BREAST IMPLANTS Right 08/03/2015   Procedure: REMOVAL OF RIGHT TISSUE EXPANDERS WITH PLACEMENT OF IMPLANT;  Surgeon: Irene Limbo, MD;  Location: [place];  Service: Plastics;  Laterality: Right;  . TUBAL LIGATION      FAMILY HISTORY Family History  Problem Relation Age of Onset  . Diabetes Mother   . Hypertension Mother   . Spinal muscular atrophy Mother   . Irregular heart beat Mother   . Anxiety disorder Mother   . Arthritis Mother   . Cancer Other     maternal great grandfather dx. unspecified type cancer  . Heart attack Maternal Grandmother   . Heart attack Maternal Uncle   . Cancer Cousin     paternal 1st cousin dx. with cancer that was typically  a childhood cancer   the patient has little information on her father's side of the family. Her mother died of complications of nonalcoholic cirrhosis of the age of 18. The patient had 4 brothers, 2 sisters. A maternal great-grandfather reportedly had cancer of some type, but she has no further information.  GYNECOLOGIC HISTORY:  No LMP recorded.  menarche age 16, first live birth age 59, the patient is [place] P2. She has periods fairly irregularly at present and they last about 3 days. She used Depo Provera last in 2000.   SOCIAL HISTORY:   Kathrynne teaches elementary school. Her husband [name] is a Customer service manager. Son avier (pronounced "X--avier") is a Environmental consultant at the [employer] in [place]. Daughter [name] 17 is at home with the patient--she graduates from high school may 2018 and will be going to [employer] state the fall of 2018. There are no grandchildren. The patient attends the [employer] in [place]: not in place    HEALTH MAINTENANCE: Social History  Substance Use Topics  . Smoking status: Never Smoker  . Smokeless tobacco: Never Used  . Alcohol use 2.0 - 2.5 oz/week    4 - 5 Standard drinks or equivalent per week     Comment: wine and beer on weekends     Colonoscopy: never   PAP: February  2017  Bone density: never   Lipid panel:  No Known Allergies  Current Outpatient Prescriptions  Medication Sig Dispense Refill  . prochlorperazine (COMPAZINE) 10 MG tablet Take one tablet three times daily as needed for nausea. 30 tablet 0  . tamoxifen (NOLVADEX) 20 MG tablet Take 1 tablet (20 mg total) by mouth daily. 90 tablet 4  . venlafaxine (EFFEXOR) 75 MG tablet Take 1 tablet (75 mg total) by mouth daily. 90 tablet 4   No current facility-administered medications for this visit.     OBJECTIVE: middle-aged African-American woman Who appears stated age 31:   04/06/16 0919  BP: 136/86  Pulse: 70  Resp: (!) 25  Temp: 97.6  F (36.4 C)     Body mass index is 32.75 kg/m.    ECOG FS:1 - Symptomatic but completely ambulatory  Sclerae unicteric, EOMs intact Oropharynx clear and moist No cervical or supraclavicular adenopathy Lungs no rales or rhonchi Heart regular rate and rhythm Abd soft, nontender, positive bowel sounds MSK no focal spinal tenderness, no upper extremity lymphedema Neuro: nonfocal, well oriented, appropriate affect Breasts: The right breast is status post mastectomy with saline reconstruction. The cosmetic result is good. There is no evidence of local recurrence. The right axilla is benign. The left breast is status post reduction mammoplasty. There are no suspicious findings. The left axilla is benign.   LAB RESULTS:  CMP     Component Value Date/Time   NA 139 07/20/2015 1508   K 4.0 07/20/2015 1508   CL 111 03/30/2015 0823   CO2 24 07/20/2015 1508   GLUCOSE 99 07/20/2015 1508   BUN 17.4 07/20/2015 1508   CREATININE 0.8 07/20/2015 1508   CALCIUM 8.3 (L) 07/20/2015 1508   PROT 6.7 07/20/2015 1508   ALBUMIN 3.5 07/20/2015 1508   AST 19 07/20/2015 1508   ALT 14 07/20/2015 1508   ALKPHOS 59 07/20/2015 1508   BILITOT <0.30 07/20/2015 1508   GFRNONAA >60 03/30/2015 0823   GFRNONAA >89 03/14/2014 1354   GFRAA >60 03/30/2015 0823   GFRAA >89 03/14/2014 1354    INo results found for: SPEP, UPEP  Lab Results  Component Value Date   WBC 6.0 07/20/2015   NEUTROABS 2.6 07/20/2015   HGB 11.2 (L) 07/20/2015   HCT 34.3 (L) 07/20/2015   MCV 94.6 07/20/2015   PLT 263 07/20/2015      Chemistry      Component Value Date/Time   NA 139 07/20/2015 1508   K 4.0 07/20/2015 1508   CL 111 03/30/2015 0823   CO2 24 07/20/2015 1508   BUN 17.4 07/20/2015 1508   CREATININE 0.8 07/20/2015 1508      Component Value Date/Time   CALCIUM 8.3 (L) 07/20/2015 1508   ALKPHOS 59 07/20/2015 1508   AST 19 07/20/2015 1508   ALT 14 07/20/2015 1508   BILITOT <0.30 07/20/2015 1508       No  results found for: LABCA2  No components found for: LABCA125  No results for input(s): INR in the last 168 hours.  Urinalysis    Component Value Date/Time   BILIRUBINUR neg 05/03/2012 0954   PROTEINUR neg 05/03/2012 0954   UROBILINOGEN 0.2 05/03/2012 0954   NITRITE positive 05/03/2012 0954   LEUKOCYTESUR Trace 05/03/2012 0954     ELIGIBLE FOR AVAILABLE RESEARCH PROTOCOL: no  STUDIES: No results found.  ASSESSMENT: 51 y.o. [place] woman s/p biopsy 02/17/2015 of 2 places in her right breast upper inner quadrant, both showing invasive ductal carcinoma,  clinically T2 NX, stage II, estrogen receptor and progesterone receptor strongly positive, HER-2/neu nonamplified, with an MIB-1 of 25%  (1) genetics testing 03/06/2015 through theBreast/Ovarian Cancer Panel offered by[facility] Laboratoriesfound no deleterious mutations in ATM, BARD1, BRCA1, BRCA2, BRIP1, CDH1, CHEK2, FANCC, MLH1, MSH2, MSH6, NBN, PALB2, PMS2, PTEN, RAD51C, RAD51D, TP53, and XRCC2. This panel also includes deletion/duplication analysis (without sequencing) for one gene, EPCAM.   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    (2) Oncotype DX score of 5 predicts an outside the breast risk of recurrence within 10 years of 5% if the patient's only systemic therapy is tamoxifen for 5 years. It also predicts no benefit from chemotherapy  (3) left breast biopsy 03/10/2015 shows an area of atypical ductal hyperplasia  (a) left lumpectomy for 03/23/2015 showed only usual ductal hyperplasia, no atypia or tumor  (4) right mastectomy with sentinel lymph node sampling 04/06/2015 shows an mpT1C pN0, stage IA invasive ductal carcinoma, grade 2 with negative margins, repeat HER-2 (2) again negative  (a) status post right saline implant placement and left reduction mammoplasty 08/03/2015 with benign pathology  (5) adjuvant radiation not indicated  (6)  tamoxifen started neoadjuvantly on 02/25/2015  PLAN: I spent approximately 30 minutes with Rodena Piety with most of that time spent discussing her complex problems. As far as breast cancer is concerned, she is a little bit behind on mammography and I have put her in for a left mammography with tomography for next week.  She has symptoms which she attributes to tamoxifen but may not be clearly related to it. My patients generally do not experience nausea from this medication. It also does not cause depression.  She has not had a period in a year so she qualifies for switch to anastrozole and today we discussed the possible toxicities, side effects and complications of that agent. After much discussion though she decided to continue on tamoxifen at least for now.  I am upping her venlafaxine to 75 mg. She knows that this is still half dose. For her depression I would normally use 150 mg but right now she does not want to "go there". I suggested she take the 75 mg for several weeks and if she does not notice some improvement call us and we will up the dose 250.  She understands that tamoxifen can cause endometrial changes and she does need to see her gynecologist on a yearly basis. She will call Dr. Caralee Ates office to arrange for that  Otherwise she will return to see me in a year. She knows to call for any problems that may develop before that.   She knows to call for any problems that may develop before the next visit.  Chauncey Cruel, MD   04/06/2016 9:42 AM Medical Oncology and Hematology [facility] [address] [place], [place] [address] Tel. [phone]    Fax. [phone]

## 2016-04-08 ENCOUNTER — Other Ambulatory Visit: Payer: Self-pay | Admitting: *Deleted

## 2016-04-08 DIAGNOSIS — C50211 Malignant neoplasm of upper-inner quadrant of right female breast: Secondary | ICD-10-CM

## 2016-04-11 ENCOUNTER — Other Ambulatory Visit: Payer: Self-pay | Admitting: *Deleted

## 2016-04-11 DIAGNOSIS — C50211 Malignant neoplasm of upper-inner quadrant of right female breast: Secondary | ICD-10-CM

## 2016-04-11 MED ORDER — VENLAFAXINE HCL 75 MG PO TABS: 75.0000 mg | ORAL_TABLET | Freq: Every day | ORAL | 0 refills | Status: DC

## 2016-04-18 ENCOUNTER — Ambulatory Visit: Payer: BC Managed Care – PPO | Admitting: Adult Health

## 2016-04-18 ENCOUNTER — Other Ambulatory Visit: Payer: BC Managed Care – PPO

## 2016-04-19 ENCOUNTER — Ambulatory Visit
Admission: RE | Admit: 2016-04-19 | Discharge: 2016-04-19 | Disposition: A | Discharge status: Home / Self Care | Payer: BC Managed Care – PPO | Source: Ambulatory Visit | Attending: Oncology | Admitting: Oncology

## 2016-04-19 DIAGNOSIS — Z170 Estrogen receptor positive status [ER+]: Principal | ICD-10-CM

## 2016-04-19 DIAGNOSIS — C50211 Malignant neoplasm of upper-inner quadrant of right female breast: Secondary | ICD-10-CM

## 2016-04-28 ENCOUNTER — Other Ambulatory Visit: Payer: BC Managed Care – PPO

## 2016-04-28 ENCOUNTER — Ambulatory Visit: Payer: BC Managed Care – PPO | Admitting: Oncology

## 2016-05-01 DIAGNOSIS — S8001XA Contusion of right knee, initial encounter: Secondary | ICD-10-CM | POA: Insufficient documentation

## 2016-06-22 ENCOUNTER — Other Ambulatory Visit (HOSPITAL_BASED_OUTPATIENT_CLINIC_OR_DEPARTMENT_OTHER): Payer: BC Managed Care – PPO

## 2016-06-22 DIAGNOSIS — C50211 Malignant neoplasm of upper-inner quadrant of right female breast: Secondary | ICD-10-CM

## 2016-06-22 LAB — CBC WITH DIFFERENTIAL/PLATELET
BASO%: 1.3 % (ref 0.0–2.0)
BASOS ABS: 0.1 10*3/uL (ref 0.0–0.1)
EOS ABS: 0.2 10*3/uL (ref 0.0–0.5)
EOS%: 3.4 % (ref 0.0–7.0)
HEMATOCRIT: 37.5 % (ref 34.8–46.6)
HGB: 12.2 g/dL (ref 11.6–15.9)
LYMPH%: 42.3 % (ref 14.0–49.7)
MCH: 30.5 pg (ref 25.1–34.0)
MCHC: 32.5 g/dL (ref 31.5–36.0)
MCV: 93.8 fL (ref 79.5–101.0)
MONO#: 0.3 10*3/uL (ref 0.1–0.9)
MONO%: 7 % (ref 0.0–14.0)
NEUT%: 46 % (ref 38.4–76.8)
NEUTROS ABS: 2.2 10*3/uL (ref 1.5–6.5)
NRBC: 0 % (ref 0–0)
PLATELETS: 305 10*3/uL (ref 145–400)
RBC: 4 10*6/uL (ref 3.70–5.45)
RDW: 14.4 % (ref 11.2–14.5)
WBC: 4.7 10*3/uL (ref 3.9–10.3)
lymph#: 2 10*3/uL (ref 0.9–3.3)

## 2016-06-22 LAB — COMPREHENSIVE METABOLIC PANEL
ALK PHOS: 109 U/L (ref 40–150)
ALT: 35 U/L (ref 0–55)
ANION GAP: 12 meq/L — AB (ref 3–11)
AST: 48 U/L — ABNORMAL HIGH (ref 5–34)
Albumin: 3.5 g/dL (ref 3.5–5.0)
BILIRUBIN TOTAL: 0.78 mg/dL (ref 0.20–1.20)
BUN: 11.8 mg/dL (ref 7.0–26.0)
CALCIUM: 9.1 mg/dL (ref 8.4–10.4)
CO2: 22 meq/L (ref 22–29)
CREATININE: 0.8 mg/dL (ref 0.6–1.1)
Chloride: 107 mEq/L (ref 98–109)
Glucose: 110 mg/dl (ref 70–140)
Potassium: 4.4 mEq/L (ref 3.5–5.1)
Sodium: 141 mEq/L (ref 136–145)
TOTAL PROTEIN: 7.1 g/dL (ref 6.4–8.3)

## 2016-07-31 ENCOUNTER — Encounter (HOSPITAL_COMMUNITY): Payer: Self-pay | Admitting: Emergency Medicine

## 2016-07-31 ENCOUNTER — Emergency Department (HOSPITAL_COMMUNITY)
Admission: EM | Admit: 2016-07-31 | Discharge: 2016-08-01 | Disposition: A | Discharge status: Home / Self Care | Payer: BC Managed Care – PPO | Attending: Emergency Medicine | Admitting: Emergency Medicine

## 2016-07-31 ENCOUNTER — Emergency Department (HOSPITAL_COMMUNITY): Payer: BC Managed Care – PPO

## 2016-07-31 DIAGNOSIS — Y929 Unspecified place or not applicable: Secondary | ICD-10-CM | POA: Diagnosis not present

## 2016-07-31 DIAGNOSIS — Y939 Activity, unspecified: Secondary | ICD-10-CM | POA: Diagnosis not present

## 2016-07-31 DIAGNOSIS — Z79899 Other long term (current) drug therapy: Secondary | ICD-10-CM | POA: Diagnosis not present

## 2016-07-31 DIAGNOSIS — S82832A Other fracture of upper and lower end of left fibula, initial encounter for closed fracture: Secondary | ICD-10-CM | POA: Insufficient documentation

## 2016-07-31 DIAGNOSIS — X58XXXA Exposure to other specified factors, initial encounter: Secondary | ICD-10-CM | POA: Insufficient documentation

## 2016-07-31 DIAGNOSIS — Y999 Unspecified external cause status: Secondary | ICD-10-CM | POA: Insufficient documentation

## 2016-07-31 DIAGNOSIS — S8992XA Unspecified injury of left lower leg, initial encounter: Secondary | ICD-10-CM | POA: Diagnosis present

## 2016-07-31 LAB — CBC WITH DIFFERENTIAL/PLATELET
BASOS PCT: 1 %
Basophils Absolute: 0 10*3/uL (ref 0.0–0.1)
EOS PCT: 2 %
Eosinophils Absolute: 0.1 10*3/uL (ref 0.0–0.7)
HEMATOCRIT: 35.4 % — AB (ref 36.0–46.0)
Hemoglobin: 11.8 g/dL — ABNORMAL LOW (ref 12.0–15.0)
Lymphocytes Relative: 40 %
Lymphs Abs: 3 10*3/uL (ref 0.7–4.0)
MCH: 30.4 pg (ref 26.0–34.0)
MCHC: 33.3 g/dL (ref 30.0–36.0)
MCV: 91.2 fL (ref 78.0–100.0)
MONO ABS: 0.4 10*3/uL (ref 0.1–1.0)
MONOS PCT: 5 %
Neutro Abs: 4.1 10*3/uL (ref 1.7–7.7)
Neutrophils Relative %: 52 %
PLATELETS: 310 10*3/uL (ref 150–400)
RBC: 3.88 MIL/uL (ref 3.87–5.11)
RDW: 14.6 % (ref 11.5–15.5)
WBC: 7.7 10*3/uL (ref 4.0–10.5)

## 2016-07-31 LAB — BASIC METABOLIC PANEL
Anion gap: 11 (ref 5–15)
BUN: 15 mg/dL (ref 6–20)
CALCIUM: 8.8 mg/dL — AB (ref 8.9–10.3)
CO2: 22 mmol/L (ref 22–32)
Chloride: 109 mmol/L (ref 101–111)
Creatinine, Ser: 0.79 mg/dL (ref 0.44–1.00)
GFR calc Af Amer: >60 mL/min (ref >60)
Glucose, Bld: 101 mg/dL — ABNORMAL HIGH (ref 65–99)
Potassium: 3.7 mmol/L (ref 3.5–5.1)
Sodium: 142 mmol/L (ref 135–145)

## 2016-07-31 MED ORDER — LIDOCAINE-PRILOCAINE 2.5-2.5 % EX CREA
TOPICAL_CREAM | Freq: Once | CUTANEOUS | Status: AC
  Administered 2016-07-31: 1 via TOPICAL
  Filled 2016-07-31: qty 5

## 2016-07-31 MED ORDER — HYDROMORPHONE HCL 1 MG/ML IJ SOLN
1.0000 mg | Freq: Once | INTRAMUSCULAR | Status: AC
  Administered 2016-07-31: 1 mg via INTRAVENOUS
  Filled 2016-07-31: qty 1

## 2016-07-31 NOTE — ED Provider Notes (Signed)
[facility] DEPT Provider Note   CSN: [id number] Arrival date & time: 07/31/16  1953     History   Chief Complaint Chief Complaint  Patient presents with  . Leg Pain    HPI Brooke [name] is a 51 y.o. female.  The history is provided by the patient. No language interpreter was used.  Leg Pain     Brooke [name] is a 51 y.o. female who presents to the Emergency Department complaining of leg pain.  She reports severe, atraumatic left leg pain that started this morning. She woke up and she was in her routine sterile health and later in the day she developed severe pain in her left anterior and posterior leg. Pain is described as cramping sensation that she cannot localize it to skin, bone, muscle. Pain is from a the knee to the ankle. It is worse with ambulation and range of motion but she does not have any weakness or numbness. No prior similar symptoms. She denies any back pain, bowel pain, fevers, chest pain, shortness of breath. She does have a history of breast cancer diagnosed one year ago and is on tamoxifen therapy. Past Medical History:  Diagnosis Date  . Anemia   . Anxiety   . Breast cancer of upper-inner quadrant of right female breast ([place]) 02/18/2015  . Chronic headaches   . Depression   . Fatigue   . Hot flashes   . Yeast infection     Patient Active Problem List   Diagnosis Date Noted  . Genetic testing 03/09/2015  . Malignant neoplasm of upper-inner quadrant of right breast in female, estrogen receptor positive ([place]) 02/18/2015  . Depression, postpartum   . Yeast infection   . Fatigue   . Chronic headaches     Past Surgical History:  Procedure Laterality Date  . BREAST LUMPECTOMY Left 2017  . BREAST LUMPECTOMY WITH RADIOACTIVE SEED LOCALIZATION Left 04/06/2015  . BREAST LUMPECTOMY WITH RADIOACTIVE SEED LOCALIZATION Left 04/06/2015   Procedure: LEFT BREAST LUMPECTOMY WITH RADIOACTIVE SEED LOCALIZATION;  Surgeon: Alphonsa Overall, MD;  Location: [facility];  Service:  General;  Laterality: Left;  . BREAST RECONSTRUCTION WITH PLACEMENT OF TISSUE EXPANDER AND FLEX HD (ACELLULAR HYDRATED DERMIS) Right 04/06/2015   Procedure: RIGHT BREAST RECONSTRUCTION WITH PLACEMENT OF TISSUE EXPANDER, POSSIBLE ACELLULAR DERMIS;  Surgeon: Irene Limbo, MD;  Location: [facility];  Service: Plastics;  Laterality: Right;  . BREAST REDUCTION SURGERY Left 08/03/2015   Procedure: LEFT BREAST REDUCTION FOR SYMMETRY  (BREAST);  Surgeon: Irene Limbo, MD;  Location: [facility];  Service: Plastics;  Laterality: Left;  . CHOLECYSTECTOMY    . GASTRIC BYPASS    . LIPOSUCTION WITH LIPOFILLING N/A 08/03/2015   Procedure: LIPOSUCTION WITH LIPOFILLING ;  Surgeon: Irene Limbo, MD;  Location: [facility];  Service: Plastics;  Laterality: N/A;  . MASTECTOMY Right 2017  . MASTECTOMY W/ SENTINEL NODE BIOPSY Right   . MASTECTOMY W/ SENTINEL NODE BIOPSY Right 04/06/2015   Procedure: RIGHT MASTECTOMY WITH SENTINEL LYMPH NODE BIOPSY;  Surgeon: Alphonsa Overall, MD;  Location: [facility];  Service: General;  Laterality: Right;  . REDUCTION MAMMAPLASTY Left 2017  . REMOVAL OF BILATERAL TISSUE EXPANDERS WITH PLACEMENT OF BILATERAL BREAST IMPLANTS Right 08/03/2015   Procedure: REMOVAL OF RIGHT TISSUE EXPANDERS WITH PLACEMENT OF IMPLANT;  Surgeon: Irene Limbo, MD;  Location: [facility];  Service: Plastics;  Laterality: Right;  . TUBAL LIGATION      OB History    Gravida Para  Term Preterm AB Living   4 2     1      SAB TAB Ectopic Multiple Live Births   1               Home Medications    Prior to Admission medications   Medication Sig Start Date End Date Taking? Authorizing Provider  ibuprofen (ADVIL,MOTRIN) 200 MG tablet Take 400 mg by mouth every 6 (six) hours as needed for moderate pain.   Yes [provider]  prochlorperazine (COMPAZINE) 10 MG tablet Take one tablet three times daily as needed for nausea. 04/06/16  Yes Magrinat, Virgie Dad, MD    tamoxifen (NOLVADEX) 20 MG tablet Take 1 tablet (20 mg total) by mouth daily. 04/06/16  Yes Magrinat, Virgie Dad, MD  venlafaxine (EFFEXOR) 75 MG tablet Take 1 tablet (75 mg total) by mouth daily. 04/11/16  Yes Magrinat, Virgie Dad, MD  oxyCODONE-acetaminophen (PERCOCET/ROXICET) 5-325 MG tablet Take 1 tablet by mouth every 6 (six) hours as needed for severe pain. 08/01/16   Quintella Reichert, MD    Family History Family History  Problem Relation Age of Onset  . Diabetes Mother   . Hypertension Mother   . Spinal muscular atrophy Mother   . Irregular heart beat Mother   . Anxiety disorder Mother   . Arthritis Mother   . Cancer Other        maternal great grandfather dx. unspecified type cancer  . Heart attack Maternal Grandmother   . Heart attack Maternal Uncle   . Cancer Cousin        paternal 1st cousin dx. with cancer that was typically a childhood cancer  . Breast cancer Neg Hx     Social History Social History  Substance Use Topics  . Smoking status: Never Smoker  . Smokeless tobacco: Never Used  . Alcohol use 2.0 - 2.5 oz/week    4 - 5 Standard drinks or equivalent per week     Comment: wine and beer on weekends     Allergies   Patient has no known allergies.   Review of Systems Review of Systems  All other systems reviewed and are negative.    Physical Exam Updated Vital Signs BP 117/61 (BP Location: Left Arm)   Pulse 76   Temp 98.2 F (36.8 C) (Oral)   Resp 18   Ht 5\' 11"  (1.803 m)   Wt 108.9 kg (240 lb)   SpO2 99%   BMI 33.47 kg/m   Physical Exam  Constitutional: She is oriented to person, place, and time. She appears well-developed and well-nourished.  HENT:  Head: Normocephalic and atraumatic.  Cardiovascular: Normal rate and regular rhythm.   No murmur heard. Pulmonary/Chest: Effort normal and breath sounds normal. No respiratory distress.  Abdominal: Soft. There is no tenderness. There is no rebound and no guarding.  Musculoskeletal:  2+ DP pulses  bilaterally. Left lower extremity is well perfused without any rashes or lesions. There is tenderness to palpation over the left anterior shin. The compartments in the left lower extremity are soft. No calf tenderness. Flexion extension intact at the ankle and knee.  Neurological: She is alert and oriented to person, place, and time.  5 out of 5 strength in bilateral lower extremities. Sensation to light touch intact in bilateral lower extremities  Skin: Skin is warm and dry.  Psychiatric: She has a normal mood and affect. Her behavior is normal.  Nursing note and vitals reviewed.    ED Treatments / Results  Labs (all labs ordered are listed, but only abnormal results are displayed) Labs Reviewed  BASIC METABOLIC PANEL - Abnormal; Notable for the following:       Result Value   Glucose, Bld 101 (*)    Calcium 8.8 (*)    All other components within normal limits  CBC WITH DIFFERENTIAL/PLATELET - Abnormal; Notable for the following:    Hemoglobin 11.8 (*)    HCT 35.4 (*)    All other components within normal limits    EKG  EKG Interpretation None       Radiology Dg Tibia/fibula Left  Result Date: 07/31/2016 CLINICAL DATA:  Left leg pain post fall. EXAM: LEFT TIBIA AND FIBULA - 2 VIEW COMPARISON:  None. FINDINGS: Minimally displaced oblique fracture of the proximal fibula with mild anterior displacement of the distal fracture fragment. Associated soft tissue swelling. IMPRESSION: Minimally displaced oblique fracture of the proximal fibula. Electronically Signed   By: Fidela Salisbury M.D.   On: 07/31/2016 22:35    Procedures Procedures (including critical care time)  Medications Ordered in ED Medications  ketorolac (TORADOL) 15 MG/ML injection 15 mg (not administered)  HYDROmorphone (DILAUDID) injection 1 mg (1 mg Intravenous Given 07/31/16 2143)  lidocaine-prilocaine (EMLA) cream (1 application Topical Given 07/31/16 2118)  HYDROmorphone (DILAUDID) injection 1 mg (1 mg  Intravenous Given 07/31/16 2310)     Initial Impression / Assessment and Plan / ED Course  I have reviewed the triage vital signs and the nursing notes.  Pertinent labs & imaging results that were available during my care of the patient were reviewed by me and considered in my medical decision making (see chart for details).   patient here for evaluation of atraumatic left circumflex showed a pain. She has significant pain on initial examination. She was treated with IV Dilaudid for pain control. Plain films demonstrate proximal fibula fracture. Unclear mechanism of injury given no trauma per patient. She has no knee or ankle tenderness and is well perfused on exam. Presentation is not consistent with compartment syndrome or acute infectious process. Discussed with Dr. Alvan Dame with orthopedics. Plan to use Ace wrap for comfort with weightbearing as tolerated.Discussed outpatient follow-up and return precautions.  Final Clinical Impressions(s) / ED Diagnoses   Final diagnoses:  Other closed fracture of proximal end of left fibula, initial encounter    New Prescriptions New Prescriptions   OXYCODONE-ACETAMINOPHEN (PERCOCET/ROXICET) 5-325 MG TABLET    Take 1 tablet by mouth every 6 (six) hours as needed for severe pain.     Quintella Reichert, MD 08/01/16 [phone]

## 2016-07-31 NOTE — ED Triage Notes (Signed)
Pt reports having left lower leg pain that started this morning causing her to fall. Pt states when attempting to walk pain increases.

## 2016-08-01 DIAGNOSIS — S82832A Other fracture of upper and lower end of left fibula, initial encounter for closed fracture: Secondary | ICD-10-CM | POA: Diagnosis not present

## 2016-08-01 MED ORDER — OXYCODONE-ACETAMINOPHEN 5-325 MG PO TABS: 1.0000 | ORAL_TABLET | Freq: Four times a day (QID) | ORAL | 0 refills | Status: DC | PRN

## 2016-08-01 MED ORDER — ONDANSETRON HCL 4 MG/2ML IJ SOLN
4.0000 mg | Freq: Once | INTRAMUSCULAR | Status: AC
  Administered 2016-08-01: 4 mg via INTRAVENOUS
  Filled 2016-08-01: qty 2

## 2016-08-01 MED ORDER — KETOROLAC TROMETHAMINE 15 MG/ML IJ SOLN
15.0000 mg | Freq: Once | INTRAMUSCULAR | Status: AC
  Administered 2016-08-01: 15 mg via INTRAVENOUS
  Filled 2016-08-01: qty 1

## 2016-08-03 ENCOUNTER — Telehealth: Payer: Self-pay | Admitting: *Deleted

## 2016-08-03 NOTE — Telephone Encounter (Signed)
This RN attempted to contact pt to follow up on symptoms - appointment with Dr Alvan Dame as well as possible needs for other interventions for call late yesterday regarding shin pain.  Note per call pm yesterday - Brooke [name] states pain occurring on top of left shin " not where the nurse showed me I have a fracture " Brooke [name] went to the ER on 7/29 post fall with new fracture noted in her left tibia.  She is awaiting a call from Dr Alvan Dame for orthopedic follow up and has placed a call to him.  Pt is not able to bear weight well but denies any swelling or redness - nor is area tender to touch.  She is using heat and cold for pain relief.  Per discussion with above and review with covering MD - Dr Yvette Rack- this RN informed pt to hold tamoxifen at present. This RN requested return call on 8/1 for follow up per visit with Dr Alvan Dame as well as update on symptoms.  Per no call from pt at 230 pm this RN attempted to reach pt and obtained identified VM- message left requesting a return call.

## 2016-08-05 ENCOUNTER — Telehealth: Payer: Self-pay

## 2016-08-05 NOTE — Telephone Encounter (Signed)
Pt went to er b/c kept falling. Went to orthopedic today and he gave her a boot. There is no blood clot. She does have a broken bone in leg which will take about 3 weeks to heal. She is asking when to restart her tamoxifen.   S/w Dr Alvy Bimler and tamoxifen will most likely be held to when boot comes off to decrease risk of clot. Will forward information to Dr Jana Hakim.

## 2016-08-31 ENCOUNTER — Encounter: Payer: Self-pay | Admitting: Emergency Medicine

## 2016-08-31 ENCOUNTER — Telehealth: Payer: Self-pay | Admitting: *Deleted

## 2016-08-31 ENCOUNTER — Ambulatory Visit (INDEPENDENT_AMBULATORY_CARE_PROVIDER_SITE_OTHER): Payer: BC Managed Care – PPO | Admitting: Emergency Medicine

## 2016-08-31 VITALS — BP 136/80 | HR 73 | Temp 99.5°F | Resp 16 | Ht 70.0 in | Wt 249.6 lb

## 2016-08-31 DIAGNOSIS — Z0001 Encounter for general adult medical examination with abnormal findings: Secondary | ICD-10-CM | POA: Diagnosis not present

## 2016-08-31 DIAGNOSIS — Z111 Encounter for screening for respiratory tuberculosis: Secondary | ICD-10-CM

## 2016-08-31 NOTE — Progress Notes (Signed)

## 2016-08-31 NOTE — Patient Instructions (Addendum)
IF you received an x-ray today, you will receive an invoice from [facility]. Please contact [facility] at [phone] with questions or concerns regarding your invoice.   IF you received labwork today, you will receive an invoice from [place]. Please contact [facility] at [phone] with questions or concerns regarding your invoice.   Our billing staff will not be able to assist you with questions regarding bills from these companies.  You will be contacted with the lab results as soon as they are available. The fastest way to get your results is to activate your My Chart account. Instructions are located on the last page of this paperwork. If you have not heard from [place] regarding the results in 2 weeks, please contact this office.      Health Maintenance, Female Adopting a healthy lifestyle and getting preventive care can go a long way to promote health and wellness. Talk with your health care provider about what schedule of regular examinations is right for you. This is a good chance for you to check in with your provider about disease prevention and staying healthy. In between checkups, there are plenty of things you can do on your own. Experts have done a lot of research about which lifestyle changes and preventive measures are most likely to keep you healthy. Ask your health care provider for more information. Weight and diet Eat a healthy diet  Be sure to include plenty of vegetables, fruits, low-fat dairy products, and lean protein.  Do not eat a lot of foods high in solid fats, added sugars, or salt.  Get regular exercise. This is one of the most important things you can do for your health. ? Most adults should exercise for at least 150 minutes each week. The exercise should increase your heart rate and make you sweat (moderate-intensity exercise). ? Most adults should also do strengthening exercises at least twice a week. This is in addition to the  moderate-intensity exercise.  Maintain a healthy weight  Body mass index (BMI) is a measurement that can be used to identify possible weight problems. It estimates body fat based on height and weight. Your health care provider can help determine your BMI and help you achieve or maintain a healthy weight.  For females 39 years of age and older: ? A BMI below 18.5 is considered underweight. ? A BMI of 18.5 to 24.9 is normal. ? A BMI of 25 to 29.9 is considered overweight. ? A BMI of 30 and above is considered obese.  Watch levels of cholesterol and blood lipids  You should start having your blood tested for lipids and cholesterol at 52 years of age, then have this test every 5 years.  You may need to have your cholesterol levels checked more often if: ? Your lipid or cholesterol levels are high. ? You are older than 52 years of age. ? You are at high risk for heart disease.  Cancer screening Lung Cancer  Lung cancer screening is recommended for adults 61-67 years old who are at high risk for lung cancer because of a history of smoking.  A yearly low-dose CT scan of the lungs is recommended for people who: ? Currently smoke. ? Have quit within the past 15 years. ? Have at least a 30-pack-year history of smoking. A pack year is smoking an average of one pack of cigarettes a day for 1 year.  Yearly screening should continue until it has been 15 years since you quit.  screening should stop if you develop a health problem that would prevent you from having lung cancer treatment.  Breast Cancer  Practice breast self-awareness. This means understanding how your breasts normally appear and feel.  It also means doing regular breast self-exams. Let your health care provider know about any changes, no matter how small.  If you are in your 20s or 30s, you should have a clinical breast exam (CBE) by a health care provider every 1-3 years as part of a regular health exam.  If you  are 40 or older, have a CBE every year. Also consider having a breast X-ray (mammogram) every year.  If you have a family history of breast cancer, talk to your health care provider about genetic screening.  If you are at high risk for breast cancer, talk to your health care provider about having an MRI and a mammogram every year.  Breast cancer gene (BRCA) assessment is recommended for women who have family members with BRCA-related cancers. BRCA-related cancers include: ? Breast. ? Ovarian. ? Tubal. ? Peritoneal cancers.  Results of the assessment will determine the need for genetic counseling and BRCA1 and BRCA2 testing.  Cervical Cancer Your health care provider may recommend that you be screened regularly for cancer of the pelvic organs (ovaries, uterus, and vagina). This screening involves a pelvic examination, including checking for microscopic changes to the surface of your cervix (Pap test). You may be encouraged to have this screening done every 3 years, beginning at age 21.  For women ages 30-65, health care providers may recommend pelvic exams and Pap testing every 3 years, or they may recommend the Pap and pelvic exam, combined with testing for human papilloma virus (HPV), every 5 years. Some types of HPV increase your risk of cervical cancer. Testing for HPV may also be done on women of any age with unclear Pap test results.  Other health care providers may not recommend any screening for nonpregnant women who are considered low risk for pelvic cancer and who do not have symptoms. Ask your health care provider if a screening pelvic exam is right for you.  If you have had past treatment for cervical cancer or a condition that could lead to cancer, you need Pap tests and screening for cancer for at least 20 years after your treatment. If Pap tests have been discontinued, your risk factors (such as having a new sexual partner) need to be reassessed to determine if screening should  resume. Some women have medical problems that increase the chance of getting cervical cancer. In these cases, your health care provider may recommend more frequent screening and Pap tests.  Colorectal Cancer  This type of cancer can be detected and often prevented.  Routine colorectal cancer screening usually begins at 52 years of age and continues through 52 years of age.  Your health care provider may recommend screening at an earlier age if you have risk factors for colon cancer.  Your health care provider may also recommend using home test kits to check for hidden blood in the stool.  A small camera at the end of a tube can be used to examine your colon directly (sigmoidoscopy or colonoscopy). This is done to check for the earliest forms of colorectal cancer.  Routine screening usually begins at age 50.  Direct examination of the colon should be repeated every 5-10 years through 52 years of age. However, you may need to be screened more often if early forms of precancerous polyps   or small growths are found.  Skin Cancer  Check your skin from head to toe regularly.  Tell your health care provider about any new moles or changes in moles, especially if there is a change in a mole's shape or color.  Also tell your health care provider if you have a mole that is larger than the size of a pencil eraser.  Always use sunscreen. Apply sunscreen liberally and repeatedly throughout the day.  Protect yourself by wearing long sleeves, pants, a wide-brimmed hat, and sunglasses whenever you are outside.  Heart disease, diabetes, and high blood pressure  High blood pressure causes heart disease and increases the risk of stroke. High blood pressure is more likely to develop in: ? People who have blood pressure in the high end of the normal range (130-139/85-89 mm Hg). ? People who are overweight or obese. ? People who are African American.  If you are 18-39 years of age, have your blood  pressure checked every 3-5 years. If you are 40 years of age or older, have your blood pressure checked every year. You should have your blood pressure measured twice-once when you are at a hospital or clinic, and once when you are not at a hospital or clinic. Record the average of the two measurements. To check your blood pressure when you are not at a hospital or clinic, you can use: ? An automated blood pressure machine at a pharmacy. ? A home blood pressure monitor.  If you are between 55 years and 79 years old, ask your health care provider if you should take aspirin to prevent strokes.  Have regular diabetes screenings. This involves taking a blood sample to check your fasting blood sugar level. ? If you are at a normal weight and have a low risk for diabetes, have this test once every three years after 52 years of age. ? If you are overweight and have a high risk for diabetes, consider being tested at a younger age or more often. Preventing infection Hepatitis B  If you have a higher risk for hepatitis B, you should be screened for this virus. You are considered at high risk for hepatitis B if: ? You were born in a country where hepatitis B is common. Ask your health care provider which countries are considered high risk. ? Your parents were born in a high-risk country, and you have not been immunized against hepatitis B (hepatitis B vaccine). ? You have HIV or AIDS. ? You use needles to inject street drugs. ? You live with someone who has hepatitis B. ? You have had sex with someone who has hepatitis B. ? You get hemodialysis treatment. ? You take certain medicines for conditions, including cancer, organ transplantation, and autoimmune conditions.  Hepatitis C  Blood testing is recommended for: ? Everyone born from 1945 through 1965. ? Anyone with known risk factors for hepatitis C.  Sexually transmitted infections (STIs)  You should be screened for sexually transmitted  infections (STIs) including gonorrhea and chlamydia if: ? You are sexually active and are younger than 52 years of age. ? You are older than 52 years of age and your health care provider tells you that you are at risk for this type of infection. ? Your sexual activity has changed since you were last screened and you are at an increased risk for chlamydia or gonorrhea. Ask your health care provider if you are at risk.  If you do not have HIV, but are at risk,   risk, it may be recommended that you take a prescription medicine daily to prevent HIV infection. This is called pre-exposure prophylaxis (PrEP). You are considered at risk if: ? You are sexually active and do not regularly use condoms or know the HIV status of your partner(s). ? You take drugs by injection. ? You are sexually active with a partner who has HIV.  Talk with your health care provider about whether you are at high risk of being infected with HIV. If you choose to begin PrEP, you should first be tested for HIV. You should then be tested every 3 months for as long as you are taking PrEP. Pregnancy  If you are premenopausal and you may become pregnant, ask your health care provider about preconception counseling.  If you may become pregnant, take 400 to 800 micrograms (mcg) of folic acid every day.  If you want to prevent pregnancy, talk to your health care provider about birth control (contraception). Osteoporosis and menopause  Osteoporosis is a disease in which the bones lose minerals and strength with aging. This can result in serious bone fractures. Your risk for osteoporosis can be identified using a bone density scan.  If you are 9 years of age or older, or if you are at risk for osteoporosis and fractures, ask your health care provider if you should be screened.  Ask your health care provider whether you should take a calcium or vitamin D supplement to lower your risk for osteoporosis.  Menopause may have certain physical  symptoms and risks.  Hormone replacement therapy may reduce some of these symptoms and risks. Talk to your health care provider about whether hormone replacement therapy is right for you. Follow these instructions at home:  Schedule regular health, dental, and eye exams.  Stay current with your immunizations.  Do not use any tobacco products including cigarettes, chewing tobacco, or electronic cigarettes.  If you are pregnant, do not drink alcohol.  If you are breastfeeding, limit how much and how often you drink alcohol.  Limit alcohol intake to no more than 1 drink per day for nonpregnant women. One drink equals 12 ounces of beer, 5 ounces of wine, or 1 ounces of hard liquor.  Do not use street drugs.  Do not share needles.  Ask your health care provider for help if you need support or information about quitting drugs.  Tell your health care provider if you often feel depressed.  Tell your health care provider if you have ever been abused or do not feel safe at home. This information is not intended to replace advice given to you by your health care provider. Make sure you discuss any questions you have with your health care provider. Document Released: 07/05/2010 Document Revised: 05/28/2015 Document Reviewed: 09/23/2014 Elsevier Interactive Patient Education  2018 [employer]. Tuberculin Skin Test Why am I having this test? Tuberculosis (TB) is a bacterial infection caused by Mycobacterium tuberculosis. Most people who are exposed to these bacteria have a strong enough defense (immune) system to prevent the bacteria from causing TB and developing symptoms. Their bodies prevent the germs from being active and making them sick (latent TB infection). However, if you have TB germs in your body and your immune system is weak, you can develop a TB infection. This can cause symptoms such as:  Night sweats.  Fever.  Weakness.  Weight loss.  A latent TB infection can also  become active later in life if your immune system becomes weakened or compromised.  You may have this test if your health care provider suspects that you have TB. You may also have this test to screen for TB if you are at risk for getting the disease. Those at increased risk include:  People who inject illegal drugs or share needles.  People with HIV or other diseases that affect immunity.  Health care workers.  People who live in high-risk communities, such as homeless shelters, nursing homes, and correctional facilities.  People who have been in contact with someone with TB.  People from countries where TB is more common.  If you are in a high-risk group, your health care provider may wish to screen for TB more often. This can help prevent the spread of the disease. Sometimes TB screening is required when starting a new job, such as becoming a Public house manager or a Pharmacist, hospital. Colleges or universities may require it of new students. What is being tested? A tuberculin skin test is the main test used to check for exposure to the bacteria that can cause TB. The test checks for antibodies to the bacteria. Antibodies are proteins that your body produces to protect you from germs and other things that can make you sick. Your health care provider will inject a solution known as PPD (purified protein derivative) under the first layer of skin on your arm. This causes a blister-like bubble to form at the site. Your health care provider will then examine the site after a number of hours have passed to see if a reaction has occurred. How do I prepare for this test? There is no preparation required for this test. What do the results mean? Your test results will be reported as either negative or positive. If the tuberculin skin test produces a negative result, it is likely that you do not have TB and have not been exposed to the TB bacteria. If you or your health care provider suspects exposure, however,  you may want to repeat the test a few weeks later. A blood test may also be used to check for TB. This is because you will not react to the tuberculin skin test until several weeks after exposure to TB bacteria. If you test positive to the tuberculin skin test, it is likely that you have been exposed to TB bacteria. The test does not distinguish between an active and a latent TB infection. A false-positive result can occur. A false-positive result for TB bacteria is incorrect because it indicates a condition or finding is present when it is not. Talk to your health care provider to discuss your results, treatment options, and if necessary, the need for more tests. It is your responsibility to obtain your test results. Ask the lab or department performing the test when and how you will get your results. Talk with your health care provider if you have any questions about your results. Talk with your health care provider to discuss your results, treatment options, and if necessary, the need for more tests. Talk with your health care provider if you have any questions about your results. This information is not intended to replace advice given to you by your health care provider. Make sure you discuss any questions you have with your health care provider. Document Released: 09/29/2004 Document Revised: 08/23/2015 Document Reviewed: 04/15/2013 Elsevier Interactive Patient Education  [employer].

## 2016-08-31 NOTE — Telephone Encounter (Signed)
Patient's physical form is in Dr Barry Brunner box when patient returns  for PPD reading in 48-72 hours.

## 2016-08-31 NOTE — Progress Notes (Signed)
Brooke [name] 51 y.o.   Chief Complaint  Patient presents with  . Annual Exam    for employment    HISTORY OF PRESENT ILLNESS: This is a 51 y.o. female here for annual exam and exam for employment.  HPI   Prior to Admission medications   Medication Sig Start Date End Date Taking? Authorizing Provider  ibuprofen (ADVIL,MOTRIN) 200 MG tablet Take 400 mg by mouth every 6 (six) hours as needed for moderate pain.   Yes [provider]  prochlorperazine (COMPAZINE) 10 MG tablet Take one tablet three times daily as needed for nausea. 04/06/16  Yes Magrinat, Virgie Dad, MD  tamoxifen (NOLVADEX) 20 MG tablet Take 1 tablet (20 mg total) by mouth daily. 04/06/16  Yes Magrinat, Virgie Dad, MD  venlafaxine (EFFEXOR) 75 MG tablet Take 1 tablet (75 mg total) by mouth daily. 04/11/16  Yes Magrinat, Virgie Dad, MD  oxyCODONE-acetaminophen (PERCOCET/ROXICET) 5-325 MG tablet Take 1 tablet by mouth every 6 (six) hours as needed for severe pain. Patient not taking: Reported on 08/31/2016 08/01/16   Quintella Reichert, MD    No Known Allergies  Patient Active Problem List   Diagnosis Date Noted  . Genetic testing 03/09/2015  . Malignant neoplasm of upper-inner quadrant of right breast in female, estrogen receptor positive ([place]) 02/18/2015  . Depression, postpartum   . Yeast infection   . Fatigue   . Chronic headaches     Past Medical History:  Diagnosis Date  . Anemia   . Anxiety   . Breast cancer of upper-inner quadrant of right female breast ([place]) 02/18/2015  . Chronic headaches   . Depression   . Fatigue   . Hot flashes   . Yeast infection     Past Surgical History:  Procedure Laterality Date  . BREAST LUMPECTOMY Left 2017  . BREAST LUMPECTOMY WITH RADIOACTIVE SEED LOCALIZATION Left 04/06/2015  . BREAST LUMPECTOMY WITH RADIOACTIVE SEED LOCALIZATION Left 04/06/2015   Procedure: LEFT BREAST LUMPECTOMY WITH RADIOACTIVE SEED LOCALIZATION;  Surgeon: Alphonsa Overall, MD;  Location: [facility];  Service:  General;  Laterality: Left;  . BREAST RECONSTRUCTION WITH PLACEMENT OF TISSUE EXPANDER AND FLEX HD (ACELLULAR HYDRATED DERMIS) Right 04/06/2015   Procedure: RIGHT BREAST RECONSTRUCTION WITH PLACEMENT OF TISSUE EXPANDER, POSSIBLE ACELLULAR DERMIS;  Surgeon: Irene Limbo, MD;  Location: [facility];  Service: Plastics;  Laterality: Right;  . BREAST REDUCTION SURGERY Left 08/03/2015   Procedure: LEFT BREAST REDUCTION FOR SYMMETRY  (BREAST);  Surgeon: Irene Limbo, MD;  Location: [facility];  Service: Plastics;  Laterality: Left;  . CHOLECYSTECTOMY    . GASTRIC BYPASS    . LIPOSUCTION WITH LIPOFILLING N/A 08/03/2015   Procedure: LIPOSUCTION WITH LIPOFILLING ;  Surgeon: Irene Limbo, MD;  Location: [facility];  Service: Plastics;  Laterality: N/A;  . MASTECTOMY Right 2017  . MASTECTOMY W/ SENTINEL NODE BIOPSY Right   . MASTECTOMY W/ SENTINEL NODE BIOPSY Right 04/06/2015   Procedure: RIGHT MASTECTOMY WITH SENTINEL LYMPH NODE BIOPSY;  Surgeon: Alphonsa Overall, MD;  Location: [facility];  Service: General;  Laterality: Right;  . REDUCTION MAMMAPLASTY Left 2017  . REMOVAL OF BILATERAL TISSUE EXPANDERS WITH PLACEMENT OF BILATERAL BREAST IMPLANTS Right 08/03/2015   Procedure: REMOVAL OF RIGHT TISSUE EXPANDERS WITH PLACEMENT OF IMPLANT;  Surgeon: Irene Limbo, MD;  Location: [facility];  Service: Plastics;  Laterality: Right;  . TUBAL LIGATION      Social History   Social History  . Marital status: Married  Spouse name: N/A  . Number of children: N/A  . Years of education: N/A   Occupational History  . Not on file.   Social History Main Topics  . Smoking status: Never Smoker  . Smokeless tobacco: Never Used  . Alcohol use 2.0 - 2.5 oz/week    4 - 5 Standard drinks or equivalent per week     Comment: wine and beer on weekends  . Drug use: No  . Sexual activity: Yes    Birth control/ protection: Surgical   Other Topics Concern  . Not on file    Social History Narrative  . No narrative on file    Family History  Problem Relation Age of Onset  . Diabetes Mother   . Hypertension Mother   . Spinal muscular atrophy Mother   . Irregular heart beat Mother   . Anxiety disorder Mother   . Arthritis Mother   . Cancer Other        maternal great grandfather dx. unspecified type cancer  . Heart attack Maternal Grandmother   . Heart attack Maternal Uncle   . Cancer Cousin        paternal 1st cousin dx. with cancer that was typically a childhood cancer  . Breast cancer Neg Hx      Review of Systems  Constitutional: Negative for chills, fever and weight loss.  HENT: Negative.   Eyes: Negative.   Respiratory: Negative.  Negative for cough and shortness of breath.   Cardiovascular: Negative.  Negative for chest pain and palpitations.  Gastrointestinal: Negative.  Negative for abdominal pain, diarrhea, nausea and vomiting.  Genitourinary: Negative.  Negative for dysuria and hematuria.  Musculoskeletal: Positive for joint pain (knees; recent left fibular Fx and right knee sprain).  Skin: Negative.  Negative for rash.  Neurological: Negative.  Negative for dizziness and headaches.  Endo/Heme/Allergies: Negative.   All other systems reviewed and are negative.  Vitals:   08/31/16 1514  BP: 136/80  Pulse: 73  Resp: 16  Temp: 99.5 F (37.5 C)  SpO2: 99%     Physical Exam  Constitutional: She is oriented to person, place, and time. She appears well-developed and well-nourished.  HENT:  Head: Normocephalic and atraumatic.  Nose: Nose normal.  Mouth/Throat: Oropharynx is clear and moist.  Eyes: Pupils are equal, round, and reactive to light. Conjunctivae and EOM are normal.  Neck: Normal range of motion. Neck supple. No JVD present. No thyromegaly present.  Cardiovascular: Normal rate, regular rhythm and normal heart sounds.   Pulmonary/Chest: Effort normal and breath sounds normal.  Abdominal: Soft. There is no  tenderness.  Musculoskeletal:  Knees: mild swelling bilaterally with some tenderness to palpation  Neurological: She is alert and oriented to person, place, and time. No sensory deficit. She exhibits normal muscle tone.  Skin: Skin is warm and dry. Capillary refill takes less than 2 seconds. No rash noted.  Psychiatric: She has a normal mood and affect.  Vitals reviewed.  Dg Tibia/fibula Left  Result Date: 07/31/2016 CLINICAL DATA:  Left leg pain post fall. EXAM: LEFT TIBIA AND FIBULA - 2 VIEW COMPARISON:  None. FINDINGS: Minimally displaced oblique fracture of the proximal fibula with mild anterior displacement of the distal fracture fragment. Associated soft tissue swelling. IMPRESSION: Minimally displaced oblique fracture of the proximal fibula. Electronically Signed   By: Fidela Salisbury M.D.   On: 07/31/2016 22:35    ASSESSMENT & PLAN: Maday was seen today for annual exam.  Diagnoses and all orders for  this visit:  Encounter for general adult medical examination with abnormal findings  PPD screening test -     TB Skin Test   Patient Instructions       IF you received an x-ray today, you will receive an invoice from [facility]. Please contact [facility] at [phone] with questions or concerns regarding your invoice.   IF you received labwork today, you will receive an invoice from [facility]. Please contact [facility] at [phone] with questions or concerns regarding your invoice.   Our billing staff will not be able to assist you with questions regarding bills from these companies.  You will be contacted with the lab results as soon as they are available. The fastest way to get your results is to activate your My Chart account. Instructions are located on the last page of this paperwork. If you have not heard from [place] regarding the results in 2 weeks, please contact this office.      Health Maintenance, Female Adopting a healthy lifestyle and  getting preventive care can go a long way to promote health and wellness. Talk with your health care provider about what schedule of regular examinations is right for you. This is a good chance for you to check in with your provider about disease prevention and staying healthy. In between checkups, there are plenty of things you can do on your own. Experts have done a lot of research about which lifestyle changes and preventive measures are most likely to keep you healthy. Ask your health care provider for more information. Weight and diet Eat a healthy diet  Be sure to include plenty of vegetables, fruits, low-fat dairy products, and lean protein.  Do not eat a lot of foods high in solid fats, added sugars, or salt.  Get regular exercise. This is one of the most important things you can do for your health. ? Most adults should exercise for at least 150 minutes each week. The exercise should increase your heart rate and make you sweat (moderate-intensity exercise). ? Most adults should also do strengthening exercises at least twice a week. This is in addition to the moderate-intensity exercise.  Maintain a healthy weight  Body mass index (BMI) is a measurement that can be used to identify possible weight problems. It estimates body fat based on height and weight. Your health care provider can help determine your BMI and help you achieve or maintain a healthy weight.  For females 98 years of age and older: ? A BMI below 18.5 is considered underweight. ? A BMI of 18.5 to 24.9 is normal. ? A BMI of 25 to 29.9 is considered overweight. ? A BMI of 30 and above is considered obese.  Watch levels of cholesterol and blood lipids  You should start having your blood tested for lipids and cholesterol at 52 years of age, then have this test every 5 years.  You may need to have your cholesterol levels checked more often if: ? Your lipid or cholesterol levels are high. ? You are older than 52 years of  age. ? You are at high risk for heart disease.  Cancer screening Lung Cancer  Lung cancer screening is recommended for adults 77-31 years old who are at high risk for lung cancer because of a history of smoking.  A yearly low-dose CT scan of the lungs is recommended for people who: ? Currently smoke. ? Have quit within the past 15 years. ? Have at least a 30-pack-year history of smoking.  A pack year is smoking an average of one pack of cigarettes a day for 1 year.  Yearly screening should continue until it has been 15 years since you quit.  Yearly screening should stop if you develop a health problem that would prevent you from having lung cancer treatment.  Breast Cancer  Practice breast self-awareness. This means understanding how your breasts normally appear and feel.  It also means doing regular breast self-exams. Let your health care provider know about any changes, no matter how small.  If you are in your 20s or 30s, you should have a clinical breast exam (CBE) by a health care provider every 1-3 years as part of a regular health exam.  If you are 54 or older, have a CBE every year. Also consider having a breast X-ray (mammogram) every year.  If you have a family history of breast cancer, talk to your health care provider about genetic screening.  If you are at high risk for breast cancer, talk to your health care provider about having an MRI and a mammogram every year.  Breast cancer gene (BRCA) assessment is recommended for women who have family members with BRCA-related cancers. BRCA-related cancers include: ? Breast. ? Ovarian. ? Tubal. ? Peritoneal cancers.  Results of the assessment will determine the need for genetic counseling and BRCA1 and BRCA2 testing.  Cervical Cancer Your health care provider may recommend that you be screened regularly for cancer of the pelvic organs (ovaries, uterus, and vagina). This screening involves a pelvic examination, including  checking for microscopic changes to the surface of your cervix (Pap test). You may be encouraged to have this screening done every 3 years, beginning at age 20.  For women ages 81-65, health care providers may recommend pelvic exams and Pap testing every 3 years, or they may recommend the Pap and pelvic exam, combined with testing for human papilloma virus (HPV), every 5 years. Some types of HPV increase your risk of cervical cancer. Testing for HPV may also be done on women of any age with unclear Pap test results.  Other health care providers may not recommend any screening for nonpregnant women who are considered low risk for pelvic cancer and who do not have symptoms. Ask your health care provider if a screening pelvic exam is right for you.  If you have had past treatment for cervical cancer or a condition that could lead to cancer, you need Pap tests and screening for cancer for at least 20 years after your treatment. If Pap tests have been discontinued, your risk factors (such as having a new sexual partner) need to be reassessed to determine if screening should resume. Some women have medical problems that increase the chance of getting cervical cancer. In these cases, your health care provider may recommend more frequent screening and Pap tests.  Colorectal Cancer  This type of cancer can be detected and often prevented.  Routine colorectal cancer screening usually begins at 52 years of age and continues through 52 years of age.  Your health care provider may recommend screening at an earlier age if you have risk factors for colon cancer.  Your health care provider may also recommend using home test kits to check for hidden blood in the stool.  A small camera at the end of a tube can be used to examine your colon directly (sigmoidoscopy or colonoscopy). This is done to check for the earliest forms of colorectal cancer.  Routine screening usually begins at age 54.  Direct examination of  the colon should be repeated every 5-10 years through 52 years of age. However, you may need to be screened more often if early forms of precancerous polyps or small growths are found.  Skin Cancer  Check your skin from head to toe regularly.  Tell your health care provider about any new moles or changes in moles, especially if there is a change in a mole's shape or color.  Also tell your health care provider if you have a mole that is larger than the size of a pencil eraser.  Always use sunscreen. Apply sunscreen liberally and repeatedly throughout the day.  Protect yourself by wearing long sleeves, pants, a wide-brimmed hat, and sunglasses whenever you are outside.  Heart disease, diabetes, and high blood pressure  High blood pressure causes heart disease and increases the risk of stroke. High blood pressure is more likely to develop in: ? People who have blood pressure in the high end of the normal range (130-139/85-89 mm Hg). ? People who are overweight or obese. ? People who are African American.  If you are 81-25 years of age, have your blood pressure checked every 3-5 years. If you are 40 years of age or older, have your blood pressure checked every year. You should have your blood pressure measured twice-once when you are at a hospital or clinic, and once when you are not at a hospital or clinic. Record the average of the two measurements. To check your blood pressure when you are not at a hospital or clinic, you can use: ? An automated blood pressure machine at a pharmacy. ? A home blood pressure monitor.  If you are between 74 years and 17 years old, ask your health care provider if you should take aspirin to prevent strokes.  Have regular diabetes screenings. This involves taking a blood sample to check your fasting blood sugar level. ? If you are at a normal weight and have a low risk for diabetes, have this test once every three years after 52 years of age. ? If you are  overweight and have a high risk for diabetes, consider being tested at a younger age or more often. Preventing infection Hepatitis B  If you have a higher risk for hepatitis B, you should be screened for this virus. You are considered at high risk for hepatitis B if: ? You were born in a country where hepatitis B is common. Ask your health care provider which countries are considered high risk. ? Your parents were born in a high-risk country, and you have not been immunized against hepatitis B (hepatitis B vaccine). ? You have HIV or AIDS. ? You use needles to inject street drugs. ? You live with someone who has hepatitis B. ? You have had sex with someone who has hepatitis B. ? You get hemodialysis treatment. ? You take certain medicines for conditions, including cancer, organ transplantation, and autoimmune conditions.  Hepatitis C  Blood testing is recommended for: ? Everyone born from [birth year] through 1966. ? Anyone with known risk factors for hepatitis C.  Sexually transmitted infections (STIs)  You should be screened for sexually transmitted infections (STIs) including gonorrhea and chlamydia if: ? You are sexually active and are younger than 52 years of age. ? You are older than 52 years of age and your health care provider tells you that you are at risk for this type of infection. ? Your sexual activity has changed since you were last screened and  you are at an increased risk for chlamydia or gonorrhea. Ask your health care provider if you are at risk.  If you do not have HIV, but are at risk, it may be recommended that you take a prescription medicine daily to prevent HIV infection. This is called pre-exposure prophylaxis (PrEP). You are considered at risk if: ? You are sexually active and do not regularly use condoms or know the HIV status of your partner(s). ? You take drugs by injection. ? You are sexually active with a partner who has HIV.  Talk with your health care provider  about whether you are at high risk of being infected with HIV. If you choose to begin PrEP, you should first be tested for HIV. You should then be tested every 3 months for as long as you are taking PrEP. Pregnancy  If you are premenopausal and you may become pregnant, ask your health care provider about preconception counseling.  If you may become pregnant, take 400 to 800 micrograms (mcg) of folic acid every day.  If you want to prevent pregnancy, talk to your health care provider about birth control (contraception). Osteoporosis and menopause  Osteoporosis is a disease in which the bones lose minerals and strength with aging. This can result in serious bone fractures. Your risk for osteoporosis can be identified using a bone density scan.  If you are 6 years of age or older, or if you are at risk for osteoporosis and fractures, ask your health care provider if you should be screened.  Ask your health care provider whether you should take a calcium or vitamin D supplement to lower your risk for osteoporosis.  Menopause may have certain physical symptoms and risks.  Hormone replacement therapy may reduce some of these symptoms and risks. Talk to your health care provider about whether hormone replacement therapy is right for you. Follow these instructions at home:  Schedule regular health, dental, and eye exams.  Stay current with your immunizations.  Do not use any tobacco products including cigarettes, chewing tobacco, or electronic cigarettes.  If you are pregnant, do not drink alcohol.  If you are breastfeeding, limit how much and how often you drink alcohol.  Limit alcohol intake to no more than 1 drink per day for nonpregnant women. One drink equals 12 ounces of beer, 5 ounces of wine, or 1 ounces of hard liquor.  Do not use street drugs.  Do not share needles.  Ask your health care provider for help if you need support or information about quitting drugs.  Tell your  health care provider if you often feel depressed.  Tell your health care provider if you have ever been abused or do not feel safe at home. This information is not intended to replace advice given to you by your health care provider. Make sure you discuss any questions you have with your health care provider. Document Released: 07/05/2010 Document Revised: 05/28/2015 Document Reviewed: 09/23/2014 Elsevier Interactive Patient Education  2018 [employer]. Tuberculin Skin Test Why am I having this test? Tuberculosis (TB) is a bacterial infection caused by Mycobacterium tuberculosis. Most people who are exposed to these bacteria have a strong enough defense (immune) system to prevent the bacteria from causing TB and developing symptoms. Their bodies prevent the germs from being active and making them sick (latent TB infection). However, if you have TB germs in your body and your immune system is weak, you can develop a TB infection. This can cause symptoms such as:  Night sweats.  Fever.  Weakness.  Weight loss.  A latent TB infection can also become active later in life if your immune system becomes weakened or compromised. You may have this test if your health care provider suspects that you have TB. You may also have this test to screen for TB if you are at risk for getting the disease. Those at increased risk include:  People who inject illegal drugs or share needles.  People with HIV or other diseases that affect immunity.  Health care workers.  People who live in high-risk communities, such as homeless shelters, nursing homes, and correctional facilities.  People who have been in contact with someone with TB.  People from countries where TB is more common.  If you are in a high-risk group, your health care provider may wish to screen for TB more often. This can help prevent the spread of the disease. Sometimes TB screening is required when starting a new job, such as becoming a  Public house manager or a Pharmacist, hospital. Colleges or universities may require it of new students. What is being tested? A tuberculin skin test is the main test used to check for exposure to the bacteria that can cause TB. The test checks for antibodies to the bacteria. Antibodies are proteins that your body produces to protect you from germs and other things that can make you sick. Your health care provider will inject a solution known as PPD (purified protein derivative) under the first layer of skin on your arm. This causes a blister-like bubble to form at the site. Your health care provider will then examine the site after a number of hours have passed to see if a reaction has occurred. How do I prepare for this test? There is no preparation required for this test. What do the results mean? Your test results will be reported as either negative or positive. If the tuberculin skin test produces a negative result, it is likely that you do not have TB and have not been exposed to the TB bacteria. If you or your health care provider suspects exposure, however, you may want to repeat the test a few weeks later. A blood test may also be used to check for TB. This is because you will not react to the tuberculin skin test until several weeks after exposure to TB bacteria. If you test positive to the tuberculin skin test, it is likely that you have been exposed to TB bacteria. The test does not distinguish between an active and a latent TB infection. A false-positive result can occur. A false-positive result for TB bacteria is incorrect because it indicates a condition or finding is present when it is not. Talk to your health care provider to discuss your results, treatment options, and if necessary, the need for more tests. It is your responsibility to obtain your test results. Ask the lab or department performing the test when and how you will get your results. Talk with your health care provider if you have any  questions about your results. Talk with your health care provider to discuss your results, treatment options, and if necessary, the need for more tests. Talk with your health care provider if you have any questions about your results. This information is not intended to replace advice given to you by your health care provider. Make sure you discuss any questions you have with your health care provider. Document Released: 09/29/2004 Document Revised: 08/23/2015 Document Reviewed: 04/15/2013 Elsevier Interactive Patient Education  2018 [employer] Inc[employer]      Agustina Caroli, MD [facility]

## 2016-09-02 ENCOUNTER — Ambulatory Visit (INDEPENDENT_AMBULATORY_CARE_PROVIDER_SITE_OTHER): Payer: BC Managed Care – PPO | Admitting: Family Medicine

## 2016-09-02 DIAGNOSIS — Z111 Encounter for screening for respiratory tuberculosis: Secondary | ICD-10-CM

## 2016-09-02 LAB — TB SKIN TEST
INDURATION: 0 mm
TB Skin Test: NEGATIVE

## 2016-09-10 NOTE — Progress Notes (Signed)
PPD/Tb skin read only; no provider exam provided.

## 2016-09-15 ENCOUNTER — Ambulatory Visit (INDEPENDENT_AMBULATORY_CARE_PROVIDER_SITE_OTHER): Payer: BC Managed Care – PPO

## 2016-09-15 ENCOUNTER — Encounter: Payer: Self-pay | Admitting: Urgent Care

## 2016-09-15 ENCOUNTER — Ambulatory Visit (INDEPENDENT_AMBULATORY_CARE_PROVIDER_SITE_OTHER): Payer: BC Managed Care – PPO | Admitting: Urgent Care

## 2016-09-15 VITALS — Ht 71.0 in | Wt 247.0 lb

## 2016-09-15 DIAGNOSIS — M1711 Unilateral primary osteoarthritis, right knee: Secondary | ICD-10-CM | POA: Diagnosis not present

## 2016-09-15 DIAGNOSIS — M7989 Other specified soft tissue disorders: Secondary | ICD-10-CM | POA: Diagnosis not present

## 2016-09-15 DIAGNOSIS — M19072 Primary osteoarthritis, left ankle and foot: Secondary | ICD-10-CM

## 2016-09-15 DIAGNOSIS — M25561 Pain in right knee: Secondary | ICD-10-CM | POA: Diagnosis not present

## 2016-09-15 DIAGNOSIS — M79672 Pain in left foot: Secondary | ICD-10-CM

## 2016-09-15 MED ORDER — CELECOXIB 100 MG PO CAPS: 100.0000 mg | ORAL_CAPSULE | Freq: Two times a day (BID) | ORAL | 1 refills | Status: DC

## 2016-09-15 NOTE — Patient Instructions (Addendum)
Celecoxib capsules  What is this medicine?  CELECOXIB (sell a KOX ib) is a non-steroidal anti-inflammatory drug (NSAID). This medicine is used to treat arthritis and ankylosing spondylitis. It may be also used for pain or painful monthly periods.  This medicine may be used for other purposes; ask your health care provider or pharmacist if you have questions.  COMMON BRAND NAME(S): Celebrex  What should I tell my health care provider before I take this medicine?  They need to know if you have any of these conditions:  -asthma  -coronary artery bypass graft (CABG) surgery within the past 2 weeks  -drink more than 3 alcohol-containing drinks a day  -heart disease or circulation problems like heart failure or leg edema (fluid retention)  -high blood pressure  -kidney disease  -liver disease  -stomach bleeding or ulcers  -an unusual or allergic reaction to celecoxib, sulfa drugs, aspirin, other NSAIDs, other medicines, foods, dyes, or preservatives  -pregnant or trying to get pregnant  -breast-feeding  How should I use this medicine?  Take this medicine by mouth with a full glass of water. Follow the directions on the prescription label. Take it with food if it upsets your stomach or if you take 400 mg at one time. Try to not lie down for at least 10 minutes after you take the medicine. Take the medicine at the same time each day. Do not take more medicine than you are told to take. Long-term, continuous use may increase the risk of heart attack or stroke.  A special MedGuide will be given to you by the pharmacist with each prescription and refill. Be sure to read this information carefully each time.  Talk to your pediatrician regarding the use of this medicine in children. Special care may be needed.  Overdosage: If you think you have taken too much of this medicine contact a poison control center or emergency room at once.  NOTE: This medicine is only for you. Do not share this medicine with others.  What if I miss  a dose?  If you miss a dose, take it as soon as you can. If it is almost time for your next dose, take only that dose. Do not take double or extra doses.  What may interact with this medicine?  Do not take this medicine with any of the following medications:  -cidofovir  -methotrexate  -other NSAIDs, medicines for pain and inflammation, like ibuprofen or naproxen  -pemetrexed  This medicine may also interact with the following medications:  -alcohol  -aspirin and aspirin-like drugs  -diuretics  -fluconazole  -lithium  -medicines for high blood pressure  -steroid medicines like prednisone or cortisone  -warfarin  This list may not describe all possible interactions. Give your health care provider a list of all the medicines, herbs, non-prescription drugs, or dietary supplements you use. Also tell them if you smoke, drink alcohol, or use illegal drugs. Some items may interact with your medicine.  What should I watch for while using this medicine?  Tell your doctor or health care professional if your pain does not get better. Talk to your doctor before taking another medicine for pain. Do not treat yourself.  This medicine does not prevent heart attack or stroke. In fact, this medicine may increase the chance of a heart attack or stroke. The chance may increase with longer use of this medicine and in people who have heart disease. If you take aspirin to prevent heart attack or stroke, talk with your   medicine. This medicine can cause ulcers and bleeding in the stomach and intestines at any time during treatment. Ulcers and bleeding can happen without warning symptoms and can cause death. What side effects may I notice from receiving this medicine? Side  effects that you should report to your doctor or health care professional as soon as possible: -allergic reactions like skin rash, itching or hives, swelling of the face, lips, or tongue -black or bloody stools, blood in the urine or vomit -blurred vision -breathing problems -chest pain -nausea, vomiting -problems with balance, talking, walking -redness, blistering, peeling or loosening of the skin, including inside the mouth -unexplained weight gain or swelling -unusually weak or tired -yellowing of eyes, skin Side effects that usually do not require medical attention (report to your doctor or health care professional if they continue or are bothersome): -constipation or diarrhea -dizziness -gas or heartburn -upset stomach This list may not describe all possible side effects. Call your doctor for medical advice about side effects. You may report side effects to FDA at 1-800-FDA-1088. Where should I keep my medicine? Keep out of the reach of children. Store at room temperature between 15 and 30 degrees C (59 and 86 degrees F). Keep container tightly closed. Throw away any unused medicine after the expiration date. NOTE: This sheet is a summary. It may not cover all possible information. If you have questions about this medicine, talk to your doctor, pharmacist, or health care provider.  2018 Elsevier/Gold Standard (2009-02-18 10:54:17)     IF you received an x-ray today, you will receive an invoice from [facility]. Please contact [facility] at [phone] with questions or concerns regarding your invoice.   IF you received labwork today, you will receive an invoice from [facility]. Please contact [facility] at [phone] with questions or concerns regarding your invoice.   Our billing staff will not be able to assist you with questions regarding bills from these companies.  You will be contacted with the lab results as soon as they are available. The fastest way  to get your results is to activate your My Chart account. Instructions are located on the last page of this paperwork. If you have not heard from [place] regarding the results in 2 weeks, please contact this office.

## 2016-09-15 NOTE — Progress Notes (Signed)
MRN: [id number] DOB: 1965-11-11  Subjective:   Brooke [name] is a 51 y.o. female presenting for chief complaint of Knee Pain (right knee pain/swelling - wears brace) and Foot Pain (left foot pain /lateral MT swelling since fx. prox fibula 07/31/2016)  Right knee - Reports ~1 month history of right knee pain, swelling. Reports throbbing type sensation over medial aspect of her right knee. Has been taking 3 Alleve 3 times daily. Denies fever, redness, trauma, falls. Patient suffered a left lower leg fracture 07/31/2016. She is seeing an orthopedist for management of her leg fracture, was advised that she had arthritis of her right knee confirmed on x-rays, last OV was 08/26/2016. Received a steroid injection to her right knee but has not experienced much improvement. She does not have f/u until another 6 weeks at the end of October 2018.   Left foot - Reports ~2 week history of worsening left foot pain and swelling. Has difficulty with walking. Was wearing a short cam-walker boot for her lower leg fracture and noticed symptoms after that.    Brooke [name] has a current medication list which includes the following prescription(s): naproxen sodium, prochlorperazine, tamoxifen, venlafaxine, ibuprofen, and oxycodone-acetaminophen. Also has No Known Allergies.  Brooke [name]  has a past medical history of Anemia; Anxiety; Breast cancer of upper-inner quadrant of right female breast ([place]) (02/18/2015); Chronic headaches; Depression; Fatigue; Hot flashes; and Yeast infection. Also  has a past surgical history that includes Gastric bypass; Tubal ligation; Cholecystectomy; Mastectomy w/ sentinel node biopsy (Right); Breast lumpectomy with radioactive seed localization (Left, 04/06/2015); Breast lumpectomy with radioactive seed localization (Left, 04/06/2015); Mastectomy w/ sentinel node biopsy (Right, 04/06/2015); Breast reconstruction with placement of tissue expander and flex hd (acellular hydrated dermis) (Right, 04/06/2015); Removal of  bilateral tissue expanders with placement of bilateral breast implants (Right, 08/03/2015); Breast reduction surgery (Left, 08/03/2015); Liposuction with lipofilling (N/A, 08/03/2015); Mastectomy (Right, 2017); Breast lumpectomy (Left, 2017); and Reduction mammaplasty (Left, 2017).  Objective:   Vitals: Ht 5\' 11"  (1.803 m)   Wt 247 lb (112 kg)   LMP 04/05/2016 (Approximate)   BMI 34.45 kg/m   Physical Exam  Constitutional: She is oriented to person, place, and time. She appears well-developed and well-nourished.  Cardiovascular: Normal rate.   Pulmonary/Chest: Effort normal.  Musculoskeletal:       Right knee: She exhibits normal range of motion, no swelling, no effusion, no ecchymosis, no deformity, no erythema, normal alignment and normal patellar mobility. Tenderness found. Medial joint line tenderness noted. No lateral joint line and no patellar tendon tenderness noted.       Left foot: There is normal range of motion, no tenderness, no bony tenderness, no swelling, normal capillary refill, no crepitus, no deformity and no laceration.  Neurological: She is alert and oriented to person, place, and time.   Dg Foot Complete Left  Result Date: 09/15/2016 CLINICAL DATA:  Left foot pain and swelling.  No known injury. EXAM: LEFT FOOT - COMPLETE 3+ VIEW COMPARISON:  None. FINDINGS: No acute fracture or malalignment. Mild degenerative changes of the first MTP joint and midfoot large plantar and Achilles enthesophytes. Mild osteopenia. Soft tissues are unremarkable. IMPRESSION: Mild degenerative changes.  No acute osseous abnormality. Electronically Signed   By: Titus Dubin M.D.   On: 09/15/2016 09:24   Assessment and Plan :   1. Left foot pain 2. Foot swelling 3. Osteoarthritis of left foot, unspecified osteoarthritis type 4. Osteoarthritis of right knee, unspecified osteoarthritis type 5. Acute pain of right knee -  Unfortunately, we do not have the OA reaction knee brace in her size  today. Patient will contact her orthopedist to set up follow up before 6 weeks from now. In the meantime, patient is to rest from her physical activity. Use Celebrex as needed for pain and inflammation. She is to stop using naproxen. Return-to-clinic precautions discussed, patient verbalized understanding.   Jaynee Eagles, PA-C [facility] [phone] 09/15/2016  8:58 AM

## 2016-09-28 IMAGING — MR MR BREAST BX W LOC DEV EA ADD LESION IMAGE BX SPEC MR GUIDE*L*
6 of 9 series · 32 of 48 positions shown · IV contrast (multihance)
Comparison: Previous exams.

ADDENDUM:
Site 1: Pathology revealed ATYPICAL DUCTAL HYPERPLASIA, USUAL DUCTAL
HYPERPLASIA, FIBROCYSTIC CHANGE of the upper outer quadrant of the
Left breast, anterior, with excision recommended.

Site 2: FIBROCYSTIC CHANGE, USUAL DUCTAL HYPERPLASIA of the upper
outer quadrant of the Left breast, posterior.
Findings are found to be concordant by Dr. Cerensu Pide.
Pathology results were discussed with the patient by telephone. The
patient reported tenderness and is doing well after the biopsy. Post
biopsy instructions and care were reviewed and questions were
answered. The patient was encouraged to call The [REDACTED] of
recent diagnosis of right breast cancer and should follow her
outlined treatment plan.
Pathology results reported by Yuju Khayana, RN on 03/11/2015.
CLINICAL DATA: Recently diagnosed right breast cancer. Patient
presents for MR guided core biopsy of 2 areas of enhancement in the
left breast.
EXAM:
MRI GUIDED CORE NEEDLE BIOPSY OF THE LEFT BREAST x2
TECHNIQUE: Multiplanar, multisequence MR imaging of the left breast was
performed both before and after administration of intravenous
contrast.
CONTRAST:  20mL MULTIHANCE GADOBENATE DIMEGLUMINE 529 MG/ML IV SOLN

[Series 2: axial pre-cm · axial · non-contrast · 1.3mm · 0.73mm/px · z∈[-72,+114]mm · 6 of 144 slices shown]
[im 1/144]
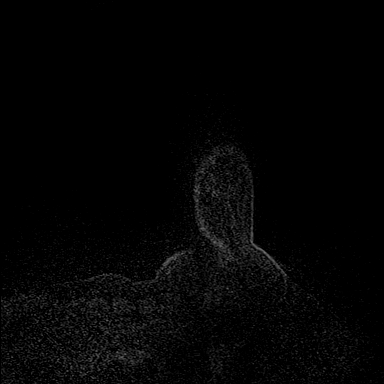
[im 29/144]
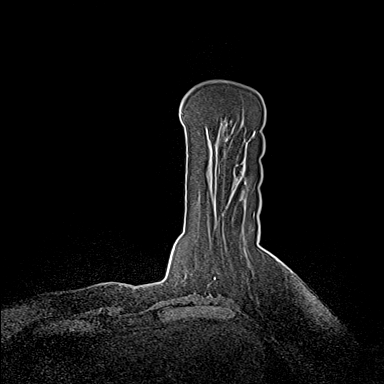
[im 58/144]
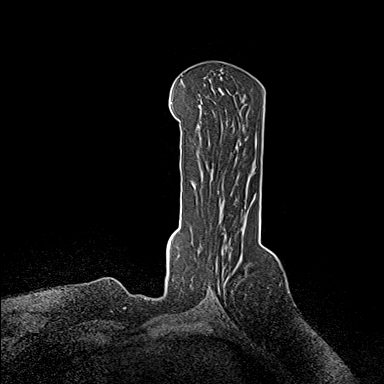
[im 86/144]
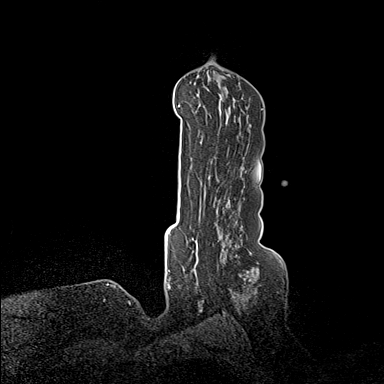
[im 115/144]
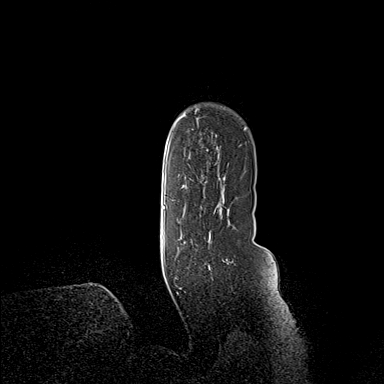
[im 144/144]
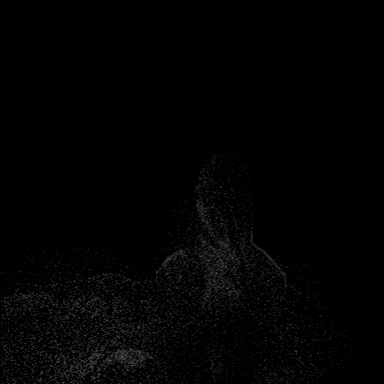

[Series 3: axial pre-cm_s2_(id) · axial · non-contrast · 1.3mm · 0.73mm/px · z∈[-72,+114]mm · 6 of 144 slices shown]
[im 1/144]
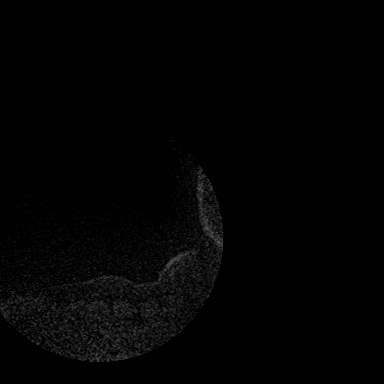
[im 29/144]
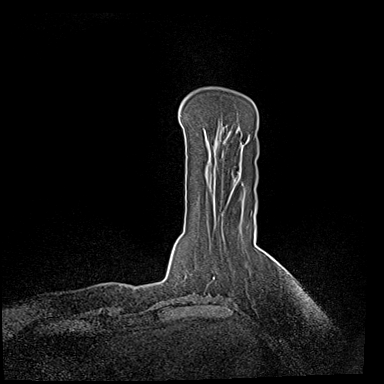
[im 58/144]
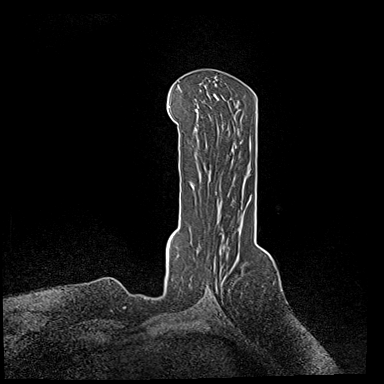
[im 86/144]
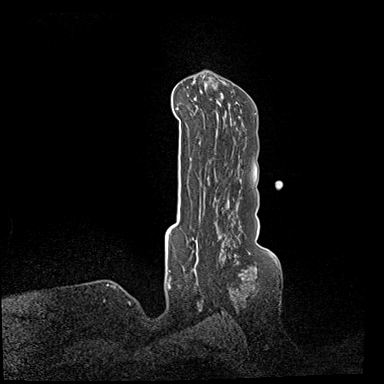
[im 115/144]
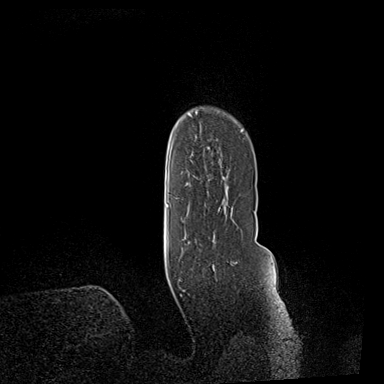
[im 144/144]
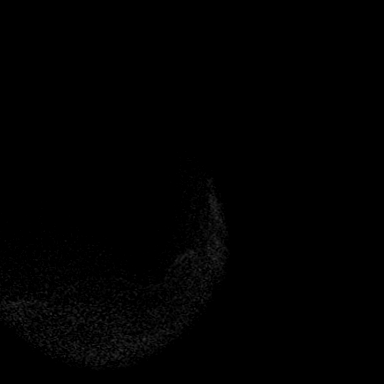

[Series 4: axial post 20 · axial · 1.3mm · 0.73mm/px · z∈[-72,+114]mm · 6 of 144 slices shown (1 of 3)]
[im 1/144]
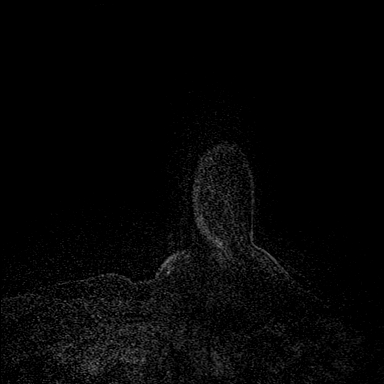
[im 29/144]
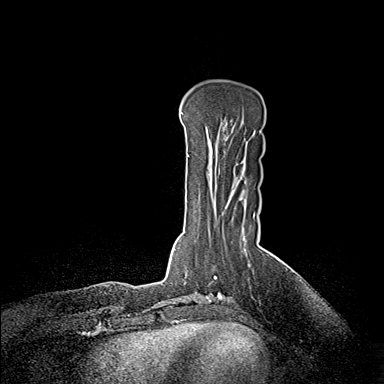
[im 58/144]
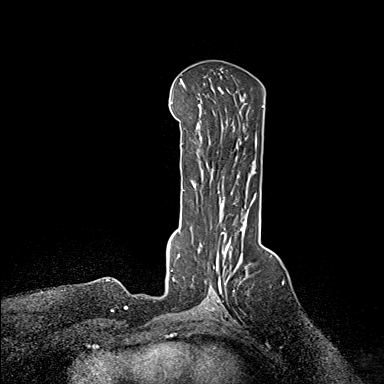
[im 86/144]
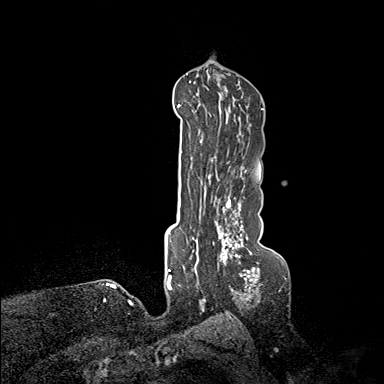
[im 115/144]
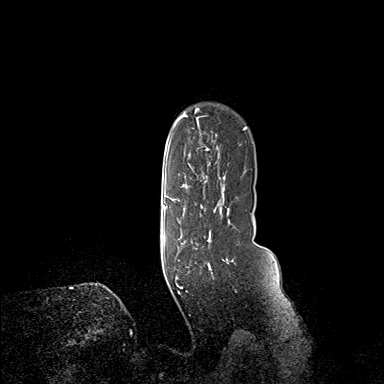
[im 144/144]
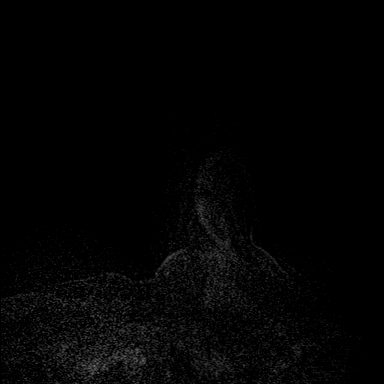

[Series 5: axial post 20 · axial · 1.3mm · 0.73mm/px · z∈[-72,+114]mm · 5 of 144 slices shown (2 of 3)]
[im 1/144]
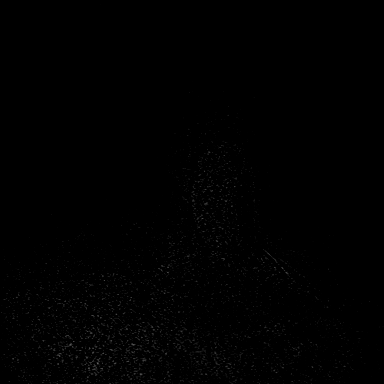
[im 36/144]
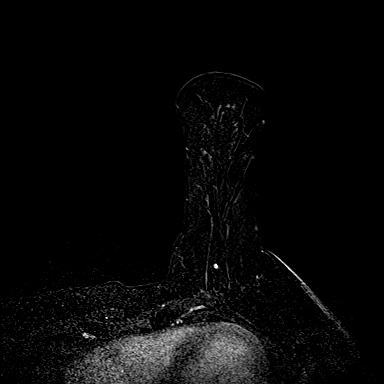
[im 72/144]
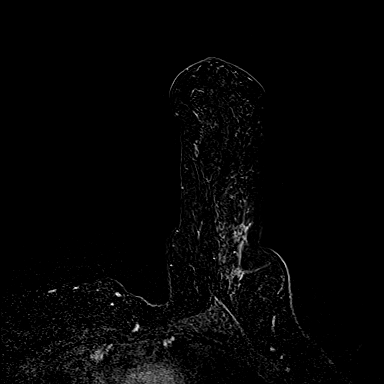
[im 108/144]
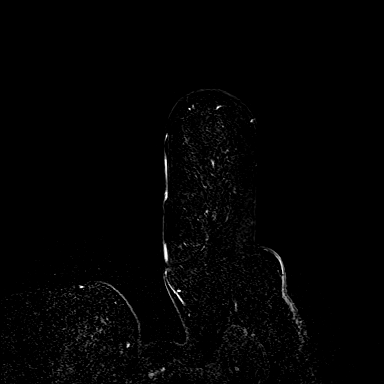
[im 144/144]
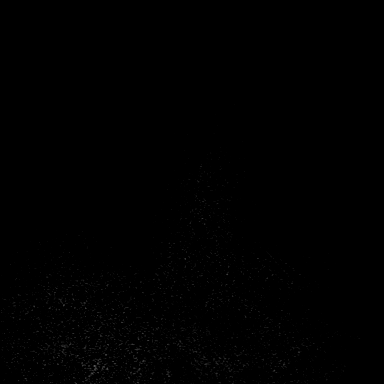

[Series 6: axial post 20 · axial · 1.3mm · 0.73mm/px · z∈[-72,+114]mm · 5 of 144 slices shown (3 of 3)]
[im 1/144]
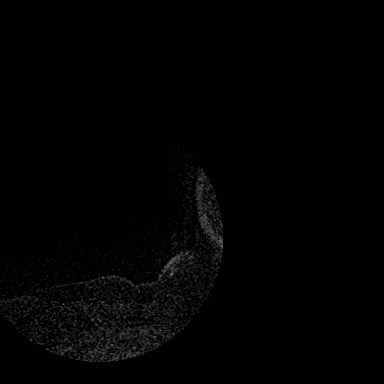
[im 36/144]
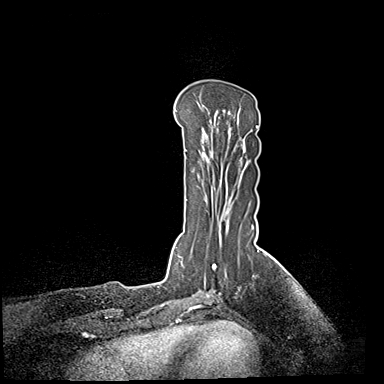
[im 72/144]
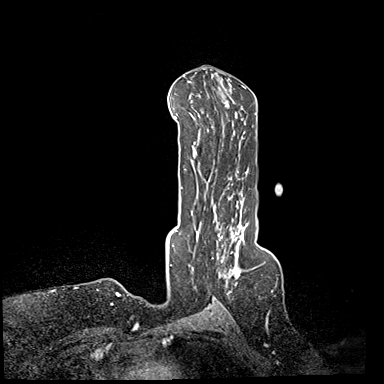
[im 108/144]
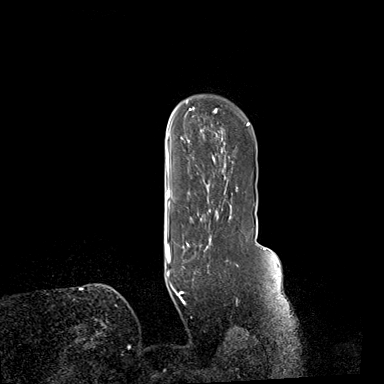
[im 144/144]
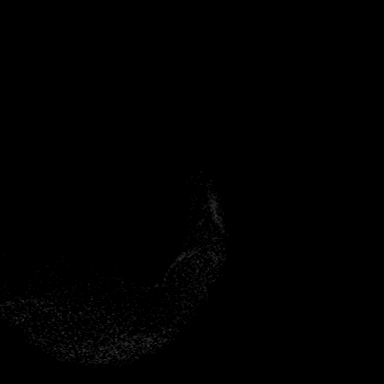

[Series 7: axial confirmation · axial · 1.3mm · 0.73mm/px · z∈[-72,+67]mm · 4 of 144 slices shown]
[im 1/144]
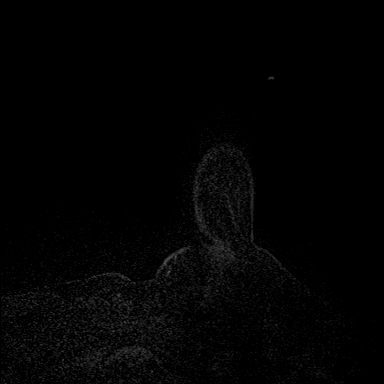
[im 36/144]
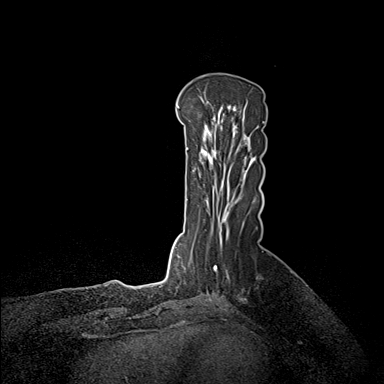
[im 72/144]
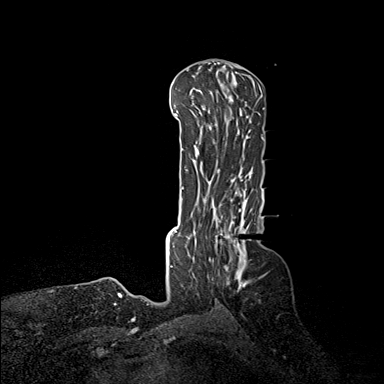
[im 108/144]
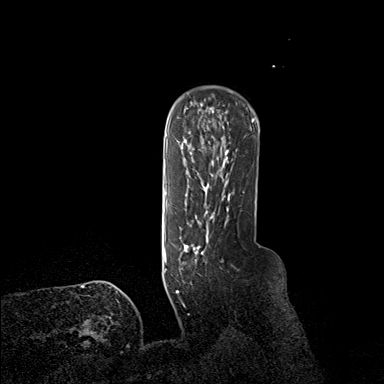

[32 of 48 positions shown; findings below may reference images not displayed]

FINDINGS: I met with the patient, and we discussed the procedure of MRI guided
biopsy, including risks, benefits, and alternatives. Specifically,
we discussed the risks of infection, bleeding, tissue injury, clip
migration, and inadequate sampling. Informed, written consent was
given. The usual time out protocol was performed immediately prior
to the procedure.

Site 1: Using sterile technique, 1% Lidocaine, MRI guidance, and a 9
gauge vacuum assisted device, biopsy was performed of anterior area
of enhancement within the upper-outer quadrant of the left breast
using a lateral approach. At the conclusion of the procedure, a
cylinder-shaped tissue marker clip was deployed into the biopsy
cavity.

Site 2: Using the same technique, biopsy was performed of the
posterior area of enhancement within the upper-outer quadrant of the
left breast using a lateral approach. At the conclusion of the
procedure a bow tie shaped tissue marker clip was deployed into the
biopsy cavity.

Follow-up 2-view mammogram was performed and dictated separately.
IMPRESSION: MRI guided biopsy of 2 areas of enhancement in the upper-outer
quadrant of the left breast. No apparent complications.

## 2017-01-26 ENCOUNTER — Encounter: Payer: Self-pay | Admitting: Family Medicine

## 2017-01-26 ENCOUNTER — Other Ambulatory Visit: Payer: Self-pay

## 2017-01-26 ENCOUNTER — Ambulatory Visit: Payer: BC Managed Care – PPO | Admitting: Family Medicine

## 2017-01-26 VITALS — BP 129/83 | HR 87 | Temp 98.6°F | Resp 17 | Ht 71.0 in | Wt 247.6 lb

## 2017-01-26 DIAGNOSIS — R6889 Other general symptoms and signs: Secondary | ICD-10-CM

## 2017-01-26 DIAGNOSIS — R519 Headache, unspecified: Secondary | ICD-10-CM

## 2017-01-26 DIAGNOSIS — J069 Acute upper respiratory infection, unspecified: Secondary | ICD-10-CM | POA: Diagnosis not present

## 2017-01-26 DIAGNOSIS — R51 Headache: Secondary | ICD-10-CM | POA: Diagnosis not present

## 2017-01-26 DIAGNOSIS — R0981 Nasal congestion: Secondary | ICD-10-CM | POA: Diagnosis not present

## 2017-01-26 LAB — POC INFLUENZA A&B (BINAX/QUICKVUE)
Influenza A, POC: NEGATIVE
Influenza B, POC: NEGATIVE

## 2017-01-26 MED ORDER — CETIRIZINE HCL 10 MG PO TABS: 10.0000 mg | ORAL_TABLET | Freq: Every day | ORAL | 6 refills | Status: DC

## 2017-01-26 MED ORDER — FLUTICASONE PROPIONATE 50 MCG/ACT NA SUSP: 2.0000 | Freq: Every day | NASAL | 6 refills | Status: DC

## 2017-01-26 MED ORDER — GUAIFENESIN ER 600 MG PO TB12: 600.0000 mg | ORAL_TABLET | Freq: Two times a day (BID) | ORAL | 1 refills | Status: DC

## 2017-01-26 MED ORDER — FLUCONAZOLE 150 MG PO TABS: 150.0000 mg | ORAL_TABLET | Freq: Once | ORAL | 0 refills | Status: AC

## 2017-01-26 MED ORDER — AMOXICILLIN-POT CLAVULANATE 875-125 MG PO TABS: 1.0000 | ORAL_TABLET | Freq: Two times a day (BID) | ORAL | 0 refills | Status: DC

## 2017-01-26 NOTE — Progress Notes (Signed)
Chief Complaint  Patient presents with  . congestion/bodyaches    x 3-4 days, st, cough, chest hurts, unable to breath due to nasal congestion, hot/cold sweats, and right eye is hurting.  No flu shot this season    HPI  Pt reports URI symptoms  Onset 4 days ago Symptoms Cough that is occasionally productive, sore throat, nasal congestion, hot and cold sweats, headache Right eye pain that is behind the eye She has not gotten [place] flu shot She states that she tried theraflu but no nasal steroids or antihistamine She has been drinking plenty of water She denies asthma  Nonsmoker She works in [employer]   Past Medical History:  Diagnosis Date  . Anemia   . Anxiety   . Breast cancer of upper-inner quadrant of right female breast ([place]) 02/18/2015  . Chronic headaches   . Depression   . Fatigue   . Hot flashes   . Yeast infection     Current Outpatient Medications  Medication Sig Dispense Refill  . amoxicillin-clavulanate (AUGMENTIN) 875-125 MG tablet Take 1 tablet by mouth 2 (two) times daily. 20 tablet 0  . cetirizine (ZYRTEC) 10 MG tablet Take 1 tablet (10 mg total) by mouth daily. 30 tablet 6  . fluconazole (DIFLUCAN) 150 MG tablet Take 1 tablet (150 mg total) by mouth once for 1 dose. For yeast vaginitis 1 tablet 0  . fluticasone (FLONASE) 50 MCG/ACT nasal spray Place 2 sprays into both nostrils daily. 16 g 6  . guaiFENesin (MUCINEX) 600 MG 12 hr tablet Take 1 tablet (600 mg total) by mouth 2 (two) times daily. 30 tablet 1  . prochlorperazine (COMPAZINE) 10 MG tablet Take one tablet three times daily as needed for nausea. (Patient not taking: Reported on 01/26/2017) 30 tablet 0  . tamoxifen (NOLVADEX) 20 MG tablet Take 1 tablet (20 mg total) by mouth daily. (Patient not taking: Reported on 01/26/2017) 90 tablet 4  . TURMERIC PO Take by mouth.     No current facility-administered medications for this visit.     Allergies: No Known Allergies  Past Surgical History:  Procedure  Laterality Date  . BREAST LUMPECTOMY Left 2017  . BREAST LUMPECTOMY WITH RADIOACTIVE SEED LOCALIZATION Left 04/06/2015  . BREAST LUMPECTOMY WITH RADIOACTIVE SEED LOCALIZATION Left 04/06/2015   Procedure: LEFT BREAST LUMPECTOMY WITH RADIOACTIVE SEED LOCALIZATION;  Surgeon: Alphonsa Overall, MD;  Location: [place];  Service: General;  Laterality: Left;  . BREAST RECONSTRUCTION WITH PLACEMENT OF TISSUE EXPANDER AND FLEX HD (ACELLULAR HYDRATED DERMIS) Right 04/06/2015   Procedure: RIGHT BREAST RECONSTRUCTION WITH PLACEMENT OF TISSUE EXPANDER, POSSIBLE ACELLULAR DERMIS;  Surgeon: Irene Limbo, MD;  Location: [place];  Service: Plastics;  Laterality: Right;  . BREAST REDUCTION SURGERY Left 08/03/2015   Procedure: LEFT BREAST REDUCTION FOR SYMMETRY  (BREAST);  Surgeon: Irene Limbo, MD;  Location: [place];  Service: Plastics;  Laterality: Left;  . CHOLECYSTECTOMY    . GASTRIC BYPASS    . LIPOSUCTION WITH LIPOFILLING N/[place] 08/03/2015   Procedure: LIPOSUCTION WITH LIPOFILLING ;  Surgeon: Irene Limbo, MD;  Location: [place];  Service: Plastics;  Laterality: N/[place];  . MASTECTOMY Right 2017  . MASTECTOMY W/ SENTINEL NODE BIOPSY Right   . MASTECTOMY W/ SENTINEL NODE BIOPSY Right 04/06/2015   Procedure: RIGHT MASTECTOMY WITH SENTINEL LYMPH NODE BIOPSY;  Surgeon: Alphonsa Overall, MD;  Location: [place];  Service: General;  Laterality: Right;  . REDUCTION MAMMAPLASTY Left 2017  . REMOVAL OF BILATERAL TISSUE EXPANDERS  WITH PLACEMENT OF BILATERAL BREAST IMPLANTS Right 08/03/2015   Procedure: REMOVAL OF RIGHT TISSUE EXPANDERS WITH PLACEMENT OF IMPLANT;  Surgeon: Irene Limbo, MD;  Location: [place];  Service: Plastics;  Laterality: Right;  . TUBAL LIGATION      Social History   Socioeconomic History  . Marital status: Married    Spouse name: None  . Number of children: None  . Years of education: None  . Highest education level: None  Social Needs  .  Financial resource strain: None  . Food insecurity - worry: None  . Food insecurity - inability: None  . Transportation needs - medical: None  . Transportation needs - non-medical: None  Occupational History  . None  Tobacco Use  . Smoking status: Never Smoker  . Smokeless tobacco: Never Used  Substance and Sexual Activity  . Alcohol use: Yes    Alcohol/week: 2.0 - 2.5 oz    Types: 4 - 5 Standard drinks or equivalent per week    Comment: wine and beer on weekends  . Drug use: No  . Sexual activity: Yes    Birth control/protection: Surgical  Other Topics Concern  . None  Social History Narrative  . None    Family History  Problem Relation Age of Onset  . Diabetes Mother   . Hypertension Mother   . Spinal muscular atrophy Mother   . Irregular heart beat Mother   . Anxiety disorder Mother   . Arthritis Mother   . Cancer Other        maternal great grandfather dx. unspecified type cancer  . Heart attack Maternal Grandmother   . Heart attack Maternal Uncle   . Cancer Cousin        paternal 1st cousin dx. with cancer that was typically [place] childhood cancer  . Breast cancer Neg Hx      ROS Review of Systems See HPI Constitution:+fevers or chills No malaise No diaphoresis Skin: No rash or itching Eyes: no blurry vision, no double vision GU: no dysuria or hematuria Neuro: no dizziness, no tremor  all others reviewed and negative   Objective: Vitals:   01/26/17 1440  BP: 129/83  Pulse: 87  Resp: 17  Temp: 98.6 F (37 C)  TempSrc: Oral  SpO2: 99%  Weight: 247 lb 9.6 oz (112.3 kg)  Height: 5\' 11"  (1.803 m)    Physical Exam  General: alert, oriented, in NAD Head: normocephalic, atraumatic, +right frontal sinus tenderness Eyes: EOM intact, no scleral icterus or conjunctival injection Ears: TM clear bilaterally Nose: +mucosa erythematous, +edematous Throat: no pharyngeal exudate or erythema Lymph: no posterior auricular, submental or cervical lymph  adenopathy Heart: normal rate, normal sinus rhythm, no murmurs Lungs: clear to auscultation bilaterally, no wheezing    Component                                 Latest Ref Rng & Units 01/26/2017  Influenza [place], POC                                    Negative Negative  Influenza B, POC                                    Negative Negative   Assessment and  Plan Brooke [name] was seen today for congestion/bodyaches.  Diagnoses and all orders for this visit:  Flu-like symptoms -     POC Influenza Brooke&B(BINAX/QUICKVUE)  Acute URI- pt not taking the right cough medications  Nasal sinus congestion- advised flonase and antihistamine  Sinus headache- supportive care with flonase and antihistamine Gave augmentin and diflucan if symptoms progress  Other orders -     fluticasone (FLONASE) 50 MCG/ACT nasal spray; Place 2 sprays into both nostrils daily. -     cetirizine (ZYRTEC) 10 MG tablet; Take 1 tablet (10 mg total) by mouth daily. -     guaiFENesin (MUCINEX) 600 MG 12 hr tablet; Take 1 tablet (600 mg total) by mouth 2 (two) times daily. -     amoxicillin-clavulanate (AUGMENTIN) 875-125 MG tablet; Take 1 tablet by mouth 2 (two) times daily. -     fluconazole (DIFLUCAN) 150 MG tablet; Take 1 tablet (150 mg total) by mouth once for 1 dose. For yeast vaginitis     Brooke [name] Brooke [name]

## 2017-01-26 NOTE — Patient Instructions (Addendum)
Take your antihistamine (zyrtec) and a nasal steroid (2 sprays each nostril in the shower) to help open up your sinuses If this does not help and you continue to have fevers then go ahead and fill the augmentin. The diflucan is if you get a yeast infection AFTER augmentin      IF you received an x-ray today, you will receive an invoice from [facility]. Please contact [facility] at [phone] with questions or concerns regarding your invoice.   IF you received labwork today, you will receive an invoice from [facility]. Please contact [facility] at [phone] with questions or concerns regarding your invoice.   Our billing staff will not be able to assist you with questions regarding bills from these companies.  You will be contacted with the lab results as soon as they are available. The fastest way to get your results is to activate your My Chart account. Instructions are located on the last page of this paperwork. If you have not heard from [place] regarding the results in 2 weeks, please contact this office.     Sinusitis, Adult Sinusitis is soreness and inflammation of your sinuses. Sinuses are hollow spaces in the bones around your face. Your sinuses are located:  Around your eyes.  In the middle of your forehead.  Behind your nose.  In your cheekbones.  Your sinuses and nasal passages are lined with a stringy fluid (mucus). Mucus normally drains out of your sinuses. When your nasal tissues become inflamed or swollen, the mucus can become trapped or blocked so air cannot flow through your sinuses. This allows bacteria, viruses, and funguses to grow, which leads to infection. Sinusitis can develop quickly and last for 7?10 days (acute) or for more than 12 weeks (chronic). Sinusitis often develops after a cold. What are the causes? This condition is caused by anything that creates swelling in the sinuses or stops mucus from draining,  including:  Allergies.  Asthma.  Bacterial or viral infection.  Abnormally shaped bones between the nasal passages.  Nasal growths that contain mucus (nasal polyps).  Narrow sinus openings.  Pollutants, such as chemicals or irritants in the air.  A foreign object stuck in the nose.  A fungal infection. This is rare.  What increases the risk? The following factors may make you more likely to develop this condition:  Having allergies or asthma.  Having had a recent cold or respiratory tract infection.  Having structural deformities or blockages in your nose or sinuses.  Having a weak immune system.  Doing a lot of swimming or diving.  Overusing nasal sprays.  Smoking.  What are the signs or symptoms? The main symptoms of this condition are pain and a feeling of pressure around the affected sinuses. Other symptoms include:  Upper toothache.  Earache.  Headache.  Bad breath.  Decreased sense of smell and taste.  A cough that may get worse at night.  Fatigue.  Fever.  Thick drainage from your nose. The drainage is often green and it may contain pus (purulent).  Stuffy nose or congestion.  Postnasal drip. This is when extra mucus collects in the throat or back of the nose.  Swelling and warmth over the affected sinuses.  Sore throat.  Sensitivity to light.  How is this diagnosed? This condition is diagnosed based on symptoms, a medical history, and a physical exam. To find out if your condition is acute or chronic, your health care provider may:  Look in your nose for signs of  nasal polyps.  Tap over the affected sinus to check for signs of infection.  View the inside of your sinuses using an imaging device that has a light attached (endoscope).  If your health care provider suspects that you have chronic sinusitis, you may also:  Be tested for allergies.  Have a sample of mucus taken from your nose (nasal culture) and checked for  bacteria.  Have a mucus sample examined to see if your sinusitis is related to an allergy.  If your sinusitis does not respond to treatment and it lasts longer than 8 weeks, you may have an MRI or CT scan to check your sinuses. These scans also help to determine how severe your infection is. In rare cases, a bone biopsy may be done to rule out more serious types of fungal sinus disease. How is this treated? Treatment for sinusitis depends on the cause and whether your condition is chronic or acute. If a virus is causing your sinusitis, your symptoms will go away on their own within 10 days. You may be given medicines to relieve your symptoms, including:  Topical nasal decongestants. They shrink swollen nasal passages and let mucus drain from your sinuses.  Antihistamines. These drugs block inflammation that is triggered by allergies. This can help to ease swelling in your nose and sinuses.  Topical nasal corticosteroids. These are nasal sprays that ease inflammation and swelling in your nose and sinuses.  Nasal saline washes. These rinses can help to get rid of thick mucus in your nose.  If your condition is caused by bacteria, you will be given an antibiotic medicine. If your condition is caused by a fungus, you will be given an antifungal medicine. Surgery may be needed to correct underlying conditions, such as narrow nasal passages. Surgery may also be needed to remove polyps. Follow these instructions at home: Medicines  Take, use, or apply over-the-counter and prescription medicines only as told by your health care provider. These may include nasal sprays.  If you were prescribed an antibiotic medicine, take it as told by your health care provider. Do not stop taking the antibiotic even if you start to feel better. Hydrate and Humidify  Drink enough water to keep your urine clear or pale yellow. Staying hydrated will help to thin your mucus.  Use a cool mist humidifier to keep the  humidity level in your home above 50%.  Inhale steam for 10-15 minutes, 3-4 times a day or as told by your health care provider. You can do this in the bathroom while a hot shower is running.  Limit your exposure to cool or dry air. Rest  Rest as much as possible.  Sleep with your head raised (elevated).  Make sure to get enough sleep each night. General instructions  Apply a warm, moist washcloth to your face 3-4 times a day or as told by your health care provider. This will help with discomfort.  Wash your hands often with soap and water to reduce your exposure to viruses and other germs. If soap and water are not available, use hand sanitizer.  Do not smoke. Avoid being around people who are smoking (secondhand smoke).  Keep all follow-up visits as told by your health care provider. This is important. Contact a health care provider if:  You have a fever.  Your symptoms get worse.  Your symptoms do not improve within 10 days. Get help right away if:  You have a severe headache.  You have persistent vomiting.  You have pain or swelling around your face or eyes.  You have vision problems.  You develop confusion.  Your neck is stiff.  You have trouble breathing. This information is not intended to replace advice given to you by your health care provider. Make sure you discuss any questions you have with your health care provider. Document Released: 12/20/2004 Document Revised: 08/16/2015 Document Reviewed: 10/15/2014 Elsevier Interactive Patient Education  [employer].

## 2017-02-28 ENCOUNTER — Ambulatory Visit: Payer: Self-pay | Admitting: *Deleted

## 2017-02-28 NOTE — Telephone Encounter (Signed)
Called in stating she fell yesterday morning hitting her head on the tub.   The floor was slippery and she did not have on shoes.   Denies loss of consciousness when it occurred.   Denies all symptoms except for a mild headache and soreness and a knot on the top left side of her head.      I went over the home care advice with her.  I went over all the s/s to look for and to call us back should any of these occur.   She asked about taking Aleve.   I told her it was ok.     She verbalized understanding and agreed to this plan of care.   Reason for Disposition . Scalp swelling, bruise or pain  Answer Assessment - Initial Assessment Questions 1. MECHANISM: "How did the injury happen?" For falls, ask: "What height did you fall from?" and "What surface did you fall against?"      Floor was slippery and I hit a wet spot.   I did not have any shoes on.  I hit it against the tub I think. 2. ONSET: "When did the injury happen?" (Minutes or hours ago)      Yesterday morning. 3. NEUROLOGIC SYMPTOMS: "Was there any loss of consciousness?" "Are there any other neurological symptoms?"      Did not loss consciousness.  I have a slight headache on the side of my head.   I hit it on the top left side. 4. MENTAL STATUS: "Does the person know who he is, who you are, and where he is?"      Alert and oriented.   Appropriate in answers 5. LOCATION: "What part of the head was hit?"      Top left side of head 6. SCALP APPEARANCE: "What does the scalp look like? Is it bleeding now?" If so, ask: "Is it difficult to stop?"      No open cuts 7. SIZE: For cuts, bruises, or swelling, ask: "How large is it?" (e.g., inches or centimeters)      I feel a knot.   I think it's bruised. 8. PAIN: "Is there any pain?" If so, ask: "How bad is it?"  (e.g., Scale 1-10; or mild, moderate, severe)     When I touch it it hurts. 9. TETANUS: For any breaks in the skin, ask: "When was the last tetanus booster?"     No breaks in skin 10.  OTHER SYMPTOMS: "Do you have any other symptoms?" (e.g., neck pain, vomiting)       No neck pain.   No nausea 11. PREGNANCY: "Is there any chance you are pregnant?" "When was your last menstrual period?"       No  Protocols used: HEAD INJURY-A-AH

## 2017-03-03 ENCOUNTER — Other Ambulatory Visit: Payer: Self-pay | Admitting: Oncology

## 2017-03-03 DIAGNOSIS — Z853 Personal history of malignant neoplasm of breast: Secondary | ICD-10-CM

## 2017-04-24 ENCOUNTER — Ambulatory Visit
Admission: RE | Admit: 2017-04-24 | Discharge: 2017-04-24 | Disposition: A | Discharge status: Home / Self Care | Source: Ambulatory Visit | Attending: Oncology | Admitting: Oncology

## 2017-04-24 DIAGNOSIS — Z853 Personal history of malignant neoplasm of breast: Secondary | ICD-10-CM

## 2017-04-24 NOTE — Progress Notes (Signed)
[place]  [phone]) [phone] Fax:(336) [phone]     ID: Brooke [name] DOB: 08/25/1965  MR#: [id number]  ENI#:[id number]  Patient Care Team: Patient, No Pcp Per as PCP - General (General Practice) Shashank Kwasnik, Virgie Dad, MD as Consulting Physician (Oncology) Thea Silversmith, MD (Inactive) as Consulting Physician (Radiation Oncology) Alphonsa Overall, MD as Consulting Physician (General Surgery) Sylvan Cheese, NP as Nurse Practitioner (Hematology and Oncology) Irene Limbo, MD as Consulting Physician (Plastic Surgery) PCP: Patient, No Pcp Per OTHER MD: [facility]  CHIEF COMPLAINT: Estrogen receptor positive breast cancer  CURRENT TREATMENT: Tamoxifen   BREAST CANCER HISTORY: From the original intake note:  Brooke [name] had routine gynecologic follow-up with Dr. Caralee Ates office and as part of that exam she had screening bilateral mammography which showed a possible mass and calcifications in the right breast. She was referred to the [facility] where on 02/12/2015 she had a right diagnostic mammography with tomo synthesis and right breast ultrasonography. In the upper inner quadrant of the right breast there was a spiculated mass measuring approximately 1.5 cm. Posterior to this mass, approximately 5.5 cm away, there was a subtle area of pleomorphic calcifications spanning 1 cm. By physical exam the upper inner quadrant right breast mass was palpable 2-3 cm from the nipple. By ultrasound this proved to be irregular and heterogeneous and measuring approximately 2.2 cm. The right axilla was benign.  On 02/16/2015 the patient underwent biopsy of the right breast mass in question and also a more posterior area of calcifications, not seen on the original mammogram, 10 cm away from the biopsied mass. 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 Biopsy of the more posterior area of calcifications also showed invasive as well as in situ ductal carcinoma, grade 1 or 2, estrogen receptor 95% positive, with strong staining intensity, progesterone receptor 45% positive, with moderate staining intensity, with an MIB-1 of 25%, and HER-2 also negative, with a signals ratio of 1.48 and the number per cell being 2.00.  The patient's subsequent history is as detailed below  INTERVAL HISTORY: [name] returns today for follow-up of her estrogen receptor positive breast cancer. She continues on tamoxifen, with good tolerance. She has hot flashes that are on and off all day and she also has night sweats. She denies increase in vaginal discharge. She was previously on venlafaxine for her hot flashes, which didn't work. She hasn't tried gabapentin.  Since her last visit, she underwent a screening mammogram at [facility] on 04/24/2017 with results of: Breast density category B. No mammographic evidence of malignancy.     REVIEW OF SYSTEMS: [name] reports that for exercise, she uses a pedometer and averages over 10,000 steps. She denies unusual headaches, visual changes, nausea, vomiting, or dizziness. There has been no unusual cough, phlegm production, or pleurisy. This been no change in bowel or bladder habits. She denies unexplained fatigue or unexplained weight loss, bleeding, rash, or fever. A detailed review of systems was otherwise stable.     PAST MEDICAL HISTORY: Past Medical History:  Diagnosis Date  . Anemia   . Anxiety   . Breast cancer of upper-inner quadrant of right female breast ([place]) 02/18/2015  . Chronic headaches   . Depression   . Fatigue   . Hot flashes   .  Yeast infection     PAST SURGICAL HISTORY: Past Surgical History:  Procedure Laterality Date  . BREAST LUMPECTOMY Left 2017  . BREAST LUMPECTOMY WITH  RADIOACTIVE SEED LOCALIZATION Left 04/06/2015  . BREAST LUMPECTOMY WITH RADIOACTIVE SEED LOCALIZATION Left 04/06/2015   Procedure: LEFT BREAST LUMPECTOMY WITH RADIOACTIVE SEED LOCALIZATION;  Surgeon: Alphonsa Overall, MD;  Location: [facility];  Service: General;  Laterality: Left;  . BREAST RECONSTRUCTION WITH PLACEMENT OF TISSUE EXPANDER AND FLEX HD (ACELLULAR HYDRATED DERMIS) Right 04/06/2015   Procedure: RIGHT BREAST RECONSTRUCTION WITH PLACEMENT OF TISSUE EXPANDER, POSSIBLE ACELLULAR DERMIS;  Surgeon: Irene Limbo, MD;  Location: [facility];  Service: Plastics;  Laterality: Right;  . BREAST REDUCTION SURGERY Left 08/03/2015   Procedure: LEFT BREAST REDUCTION FOR SYMMETRY  (BREAST);  Surgeon: Irene Limbo, MD;  Location: [facility];  Service: Plastics;  Laterality: Left;  . CHOLECYSTECTOMY    . GASTRIC BYPASS    . LIPOSUCTION WITH LIPOFILLING N/A 08/03/2015   Procedure: LIPOSUCTION WITH LIPOFILLING ;  Surgeon: Irene Limbo, MD;  Location: [facility];  Service: Plastics;  Laterality: N/A;  . MASTECTOMY Right 2017  . MASTECTOMY W/ SENTINEL NODE BIOPSY Right   . MASTECTOMY W/ SENTINEL NODE BIOPSY Right 04/06/2015   Procedure: RIGHT MASTECTOMY WITH SENTINEL LYMPH NODE BIOPSY;  Surgeon: Alphonsa Overall, MD;  Location: [facility];  Service: General;  Laterality: Right;  . REDUCTION MAMMAPLASTY Left 2017  . REMOVAL OF BILATERAL TISSUE EXPANDERS WITH PLACEMENT OF BILATERAL BREAST IMPLANTS Right 08/03/2015   Procedure: REMOVAL OF RIGHT TISSUE EXPANDERS WITH PLACEMENT OF IMPLANT;  Surgeon: Irene Limbo, MD;  Location: [facility];  Service: Plastics;  Laterality: Right;  . TUBAL LIGATION      FAMILY HISTORY Family History  Problem Relation Age of Onset  . Diabetes Mother   . Hypertension Mother   . Spinal muscular atrophy Mother   . Irregular heart beat Mother   . Anxiety disorder Mother   . Arthritis Mother   . Cancer Other        maternal great grandfather dx.  unspecified type cancer  . Heart attack Maternal Grandmother   . Heart attack Maternal Uncle   . Cancer Cousin        paternal 1st cousin dx. with cancer that was typically a childhood cancer  . Breast cancer Neg Hx    the patient has little information on her father's side of the family. Her mother died of complications of nonalcoholic cirrhosis of the age of 9. The patient had 4 brothers, 2 sisters. A maternal great-grandfather reportedly had cancer of some type, but she has no further information.  GYNECOLOGIC HISTORY:  Patient's last menstrual period was 04/05/2016 (approximate).  menarche age 6, first live birth age 20, the patient is [place] P2. She has periods fairly irregularly at present and they last about 3 days. She used Depo Provera last in 2000.   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 Her husband [name] is a Customer service manager. Son Phillips Odor (pronounced "X--avier") is a Environmental consultant at [employer] in [place]. Daughter [name], is now attending [employer]. There are no grandchildren. The patient attends the [employer] in [place]: not in place    HEALTH MAINTENANCE: Social History   Tobacco Use  . Smoking status: Never Smoker  . Smokeless tobacco: Never Used  Substance Use Topics  . Alcohol use:  Yes    Alcohol/week: 2.0 - 2.5 oz    Types: 4 - 5 Standard drinks or equivalent per week    Comment: wine and beer on weekends  . Drug use: No     Colonoscopy: never   PAP: February 2017  Bone density: never   Lipid panel:  No Known Allergies  Current Outpatient Medications  Medication Sig Dispense Refill  . amoxicillin-clavulanate (AUGMENTIN) 875-125 MG tablet Take 1 tablet by mouth 2 (two) times daily. 20 tablet 0  . cetirizine (ZYRTEC) 10 MG tablet Take 1 tablet (10 mg total) by mouth daily. 30 tablet 6  . fluticasone (FLONASE) 50 MCG/ACT  nasal spray Place 2 sprays into both nostrils daily. 16 g 6  . guaiFENesin (MUCINEX) 600 MG 12 hr tablet Take 1 tablet (600 mg total) by mouth 2 (two) times daily. 30 tablet 1  . prochlorperazine (COMPAZINE) 10 MG tablet Take one tablet three times daily as needed for nausea. (Patient not taking: Reported on 01/26/2017) 30 tablet 0  . tamoxifen (NOLVADEX) 20 MG tablet Take 1 tablet (20 mg total) by mouth daily. (Patient not taking: Reported on 01/26/2017) 90 tablet 4  . TURMERIC PO Take by mouth.     No current facility-administered medications for this visit.     OBJECTIVE: middle-aged African-American woman in no acute distress Vitals:   04/25/17 1315  BP: 124/73  Pulse: 65  Resp: 20  Temp: 98.8 F (37.1 C)  SpO2: 100%     Body mass index is 35.43 kg/m.    ECOG FS:0 - Asymptomatic  Sclerae unicteric, pupils round and equal Oropharynx clear and moist No cervical or supraclavicular adenopathy Lungs no rales or rhonchi Heart regular rate and rhythm Abd soft, nontender, positive bowel sounds MSK no focal spinal tenderness, no upper extremity lymphedema Neuro: nonfocal, well oriented, appropriate affect Breasts: The right breast is status post mastectomy.  She has undergone saline implant reconstruction.  There is no evidence of local recurrence.  The left breast is status post reduction mammoplasty.  Both axillae are benign.    LAB RESULTS:  CMP     Component Value Date/Time   NA 142 07/31/2016 2114   NA 141 06/22/2016 0858   K 3.7 07/31/2016 2114   K 4.4 06/22/2016 0858   CL 109 07/31/2016 2114   CO2 22 07/31/2016 2114   CO2 22 06/22/2016 0858   GLUCOSE 101 (H) 07/31/2016 2114   GLUCOSE 110 06/22/2016 0858   BUN 15 07/31/2016 2114   BUN 11.8 06/22/2016 0858   CREATININE 0.79 07/31/2016 2114   CREATININE 0.8 06/22/2016 0858   CALCIUM 8.8 (L) 07/31/2016 2114   CALCIUM 9.1 06/22/2016 0858   PROT 7.1 06/22/2016 0858   ALBUMIN 3.5 06/22/2016 0858   AST 48 (H) 06/22/2016  0858   ALT 35 06/22/2016 0858   ALKPHOS 109 06/22/2016 0858   BILITOT 0.78 06/22/2016 0858   GFRNONAA >60 07/31/2016 2114   GFRNONAA >89 03/14/2014 1354   GFRAA >60 07/31/2016 2114   GFRAA >89 03/14/2014 1354    INo results found for: SPEP, UPEP  Lab Results  Component Value Date   WBC 4.6 04/25/2017   NEUTROABS 2.3 04/25/2017   HGB 11.4 (L) 04/25/2017   HCT 35.0 04/25/2017   MCV 92.8 04/25/2017   PLT 263 04/25/2017      Chemistry      Component Value Date/Time   NA 142 07/31/2016 2114   NA 141 06/22/2016 0858   K 3.7 07/31/2016  2114   K 4.4 06/22/2016 0858   CL 109 07/31/2016 2114   CO2 22 07/31/2016 2114   CO2 22 06/22/2016 0858   BUN 15 07/31/2016 2114   BUN 11.8 06/22/2016 0858   CREATININE 0.79 07/31/2016 2114   CREATININE 0.8 06/22/2016 0858      Component Value Date/Time   CALCIUM 8.8 (L) 07/31/2016 2114   CALCIUM 9.1 06/22/2016 0858   ALKPHOS 109 06/22/2016 0858   AST 48 (H) 06/22/2016 0858   ALT 35 06/22/2016 0858   BILITOT 0.78 06/22/2016 0858       No results found for: LABCA2  No components found for: LABCA125  No results for input(s): INR in the last 168 hours.  Urinalysis    Component Value Date/Time   BILIRUBINUR neg 05/03/2012 0954   PROTEINUR neg 05/03/2012 0954   UROBILINOGEN 0.2 05/03/2012 0954   NITRITE positive 05/03/2012 0954   LEUKOCYTESUR Trace 05/03/2012 0954     ELIGIBLE FOR AVAILABLE RESEARCH PROTOCOL: no  STUDIES: Mm Screening Breast Tomo Uni L  Result Date: 04/25/2017 CLINICAL DATA:  Screening. EXAM: DIGITAL SCREENING UNILATERAL LEFT MAMMOGRAM WITH CAD AND TOMO COMPARISON:  Previous exam(s). ACR Breast Density Category b: There are scattered areas of fibroglandular density. FINDINGS: There are no findings suspicious for malignancy. Stable areas of postsurgical scarring. Images were processed with CAD. IMPRESSION: No mammographic evidence of malignancy. A result letter of this screening mammogram will be mailed directly  to the patient. RECOMMENDATION: Screening mammogram in one year. (Code:SM-B-01Y) BI-RADS CATEGORY  2: Benign. Electronically Signed   By: Lajean Manes M.D.   On: 04/25/2017 12:12    ASSESSMENT: 52 y.o. [place] woman s/p biopsy 02/17/2015 of 2 places in her right breast upper inner quadrant, both showing invasive ductal carcinoma, clinically T2 NX, stage II, estrogen receptor and progesterone receptor strongly positive, HER-2/neu nonamplified, with an MIB-1 of 25%  (1) genetics testing 03/06/2015 through theBreast/Ovarian Cancer Panel offered by[facility] Laboratoriesfound no deleterious mutations in ATM, BARD1, BRCA1, BRCA2, BRIP1, CDH1, CHEK2, FANCC, MLH1, MSH2, MSH6, NBN, PALB2, PMS2, PTEN, RAD51C, RAD51D, TP53, and XRCC2. This panel also includes deletion/duplication analysis (without sequencing) for one gene, EPCAM.   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    (2) Oncotype DX score of 5 predicts an outside the breast risk of recurrence within 10 years of 5% if the patient's only systemic therapy is tamoxifen for 5 years. It also predicts no benefit from chemotherapy  (3) left breast biopsy 03/10/2015 shows an area of atypical ductal hyperplasia  (a) left lumpectomy for 03/23/2015 showed only usual ductal hyperplasia, no atypia or tumor  (4) right mastectomy with sentinel lymph node sampling 04/06/2015 shows an mpT1C pN0, stage IA invasive ductal carcinoma, grade 2 with negative margins, repeat HER-2 (2) again negative  (a) status post right saline implant placement and left reduction mammoplasty 08/03/2015 with benign pathology  (5) adjuvant radiation not indicated  (6) tamoxifen started neoadjuvantly on 02/25/2015  PLAN: Floree is now 2 years out from definitive surgery for her breast cancer with no evidence of disease recurrence.  This is very favorable.  She is tolerating tamoxifen well and the plan  is to continue that for a total of 5 years.  She is having problems with hot flashes and night sweats.  She did not do well with venlafaxine.  We discussed gabapentin and TTS 1 patches.  She has a good understanding of the  possible toxicity side effects and complications of these agents.  After much discussion she decided she would like to give the gabapentin a try and I am putting in that prescription for her.  She knows to call for any problems that may develop before next visit which will be in 1 year.  Elayne Gruver, Virgie Dad, MD  04/25/17 1:39 PM Medical Oncology and Hematology [facility] [address] [place], [place] [address] Tel. [phone]    Fax. [phone]    This document serves as a record of services personally performed by Lurline Del, MD. It was created on his behalf by Steva Colder, a trained medical scribe. The creation of this record is based on the scribe's personal observations and the provider's statements to them.   I have reviewed the above documentation for accuracy and completeness, and I agree with the above.

## 2017-04-25 ENCOUNTER — Inpatient Hospital Stay: Payer: BC Managed Care – PPO

## 2017-04-25 ENCOUNTER — Inpatient Hospital Stay: Payer: BC Managed Care – PPO | Attending: Oncology | Admitting: Oncology

## 2017-04-25 ENCOUNTER — Telehealth: Payer: Self-pay | Admitting: Oncology

## 2017-04-25 VITALS — BP 124/73 | HR 65 | Temp 98.8°F | Resp 20 | Ht 71.0 in | Wt 254.0 lb

## 2017-04-25 DIAGNOSIS — Z79810 Long term (current) use of selective estrogen receptor modulators (SERMs): Secondary | ICD-10-CM | POA: Diagnosis not present

## 2017-04-25 DIAGNOSIS — C50211 Malignant neoplasm of upper-inner quadrant of right female breast: Secondary | ICD-10-CM | POA: Diagnosis present

## 2017-04-25 DIAGNOSIS — Z9011 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: Secondary | ICD-10-CM | POA: Insufficient documentation

## 2017-04-25 DIAGNOSIS — Z170 Estrogen receptor positive status [ER+]: Secondary | ICD-10-CM | POA: Diagnosis not present

## 2017-04-25 LAB — COMPREHENSIVE METABOLIC PANEL
ALK PHOS: 83 U/L (ref 40–150)
ALT: 28 U/L (ref 0–55)
ANION GAP: 6 (ref 3–11)
AST: 23 U/L (ref 5–34)
Albumin: 3.5 g/dL (ref 3.5–5.0)
BUN: 11 mg/dL (ref 7–26)
CO2: 25 mmol/L (ref 22–29)
CREATININE: 0.95 mg/dL (ref 0.60–1.10)
Calcium: 8.7 mg/dL (ref 8.4–10.4)
Chloride: 107 mmol/L (ref 98–109)
Glucose, Bld: 96 mg/dL (ref 70–140)
Potassium: 4.4 mmol/L (ref 3.5–5.1)
SODIUM: 138 mmol/L (ref 136–145)
TOTAL PROTEIN: 6.8 g/dL (ref 6.4–8.3)
Total Bilirubin: 0.3 mg/dL (ref 0.2–1.2)

## 2017-04-25 LAB — CBC WITH DIFFERENTIAL/PLATELET
BASOS ABS: 0.1 10*3/uL (ref 0.0–0.1)
Basophils Relative: 1 %
EOS ABS: 0.2 10*3/uL (ref 0.0–0.5)
Eosinophils Relative: 4 %
HCT: 35 % (ref 34.8–46.6)
HEMOGLOBIN: 11.4 g/dL — AB (ref 11.6–15.9)
Lymphocytes Relative: 38 %
Lymphs Abs: 1.7 10*3/uL (ref 0.9–3.3)
MCH: 30.2 pg (ref 25.1–34.0)
MCHC: 32.6 g/dL (ref 31.5–36.0)
MCV: 92.8 fL (ref 79.5–101.0)
MONOS PCT: 8 %
Monocytes Absolute: 0.3 10*3/uL (ref 0.1–0.9)
NEUTROS ABS: 2.3 10*3/uL (ref 1.5–6.5)
NEUTROS PCT: 49 %
Platelets: 263 10*3/uL (ref 145–400)
RBC: 3.77 MIL/uL (ref 3.70–5.45)
RDW: 13.8 % (ref 11.2–14.5)
WBC: 4.6 10*3/uL (ref 3.9–10.3)

## 2017-04-25 MED ORDER — GABAPENTIN 300 MG PO CAPS: 300.0000 mg | ORAL_CAPSULE | Freq: Every day | ORAL | 4 refills | Status: DC

## 2017-04-25 MED ORDER — VALACYCLOVIR HCL 500 MG PO TABS: 500.0000 mg | ORAL_TABLET | Freq: Every day | ORAL | 4 refills | Status: DC

## 2017-04-25 MED ORDER — TAMOXIFEN CITRATE 20 MG PO TABS: 20.0000 mg | ORAL_TABLET | Freq: Every day | ORAL | 4 refills | Status: DC

## 2017-04-25 NOTE — Telephone Encounter (Signed)
Gave patient AVS and calendar of upcoming May 2020 appointments.

## 2017-05-29 ENCOUNTER — Encounter: Payer: Self-pay | Admitting: Family Medicine

## 2017-07-28 ENCOUNTER — Other Ambulatory Visit: Payer: Self-pay

## 2017-07-28 ENCOUNTER — Ambulatory Visit (INDEPENDENT_AMBULATORY_CARE_PROVIDER_SITE_OTHER): Payer: BC Managed Care – PPO | Admitting: Nurse Practitioner

## 2017-07-28 ENCOUNTER — Encounter: Payer: Self-pay | Admitting: Nurse Practitioner

## 2017-07-28 VITALS — BP 139/97 | HR 60 | Temp 97.9°F | Ht 71.0 in | Wt 234.3 lb

## 2017-07-28 DIAGNOSIS — Z01419 Encounter for gynecological examination (general) (routine) without abnormal findings: Secondary | ICD-10-CM

## 2017-07-28 DIAGNOSIS — Z1151 Encounter for screening for human papillomavirus (HPV): Secondary | ICD-10-CM | POA: Diagnosis not present

## 2017-07-28 DIAGNOSIS — Z124 Encounter for screening for malignant neoplasm of cervix: Secondary | ICD-10-CM | POA: Diagnosis not present

## 2017-07-28 NOTE — Progress Notes (Signed)
New GYN/PAP.  Last Mammo 04/2017 has breast Cancer.  Last PAP 2017.

## 2017-07-28 NOTE — Progress Notes (Signed)
GYNECOLOGY ANNUAL PREVENTATIVE CARE ENCOUNTER NOTE  Subjective:   Brooke [name] is a 52 y.o. G35P0010 female here for a routine annual gynecologic exam.  Current complaints: she needs a pap smear.  In April had follow up with her oncologist and has had a mammogram this year.   Denies abnormal vaginal bleeding, discharge, pelvic pain, problems with intercourse or other gynecologic concerns.    Gynecologic History Patient's last menstrual period was 04/05/2016 (approximate). Contraception: tubal ligation Last Pap: 01-23-2012. Results were: normal Last mammogram: 04-25-2017. Results were: normal  Obstetric History OB History  Gravida Para Term Preterm AB Living  4 2     1     SAB TAB Ectopic Multiple Live Births  1            # Outcome Date GA Lbr Len/2nd Weight Sex Delivery Anes PTL Lv  4 Gravida           3 SAB           2 Para           1 Para             Past Medical History:  Diagnosis Date  . Anemia   . Anxiety   . Breast cancer of upper-inner quadrant of right female breast ([facility]) 02/18/2015  . Chronic headaches   . Depression   . Fatigue   . Hot flashes   . Yeast infection     Past Surgical History:  Procedure Laterality Date  . BREAST LUMPECTOMY Left 2017  . BREAST LUMPECTOMY WITH RADIOACTIVE SEED LOCALIZATION Left 04/06/2015  . BREAST LUMPECTOMY WITH RADIOACTIVE SEED LOCALIZATION Left 04/06/2015   Procedure: LEFT BREAST LUMPECTOMY WITH RADIOACTIVE SEED LOCALIZATION;  Surgeon: Alphonsa Overall, MD;  Location: [facility];  Service: General;  Laterality: Left;  . BREAST RECONSTRUCTION WITH PLACEMENT OF TISSUE EXPANDER AND FLEX HD (ACELLULAR HYDRATED DERMIS) Right 04/06/2015   Procedure: RIGHT BREAST RECONSTRUCTION WITH PLACEMENT OF TISSUE EXPANDER, POSSIBLE ACELLULAR DERMIS;  Surgeon: Irene Limbo, MD;  Location: [facility];  Service: Plastics;  Laterality: Right;  . BREAST REDUCTION SURGERY Left 08/03/2015   Procedure: LEFT BREAST REDUCTION FOR SYMMETRY  (BREAST);  Surgeon: Irene Limbo, MD;  Location: [facility];  Service: Plastics;  Laterality: Left;  . CHOLECYSTECTOMY    . GASTRIC BYPASS    . LIPOSUCTION WITH LIPOFILLING N/A 08/03/2015   Procedure: LIPOSUCTION WITH LIPOFILLING ;  Surgeon: Irene Limbo, MD;  Location: [facility];  Service: Plastics;  Laterality: N/A;  . MASTECTOMY Right 2017  . MASTECTOMY W/ SENTINEL NODE BIOPSY Right   . MASTECTOMY W/ SENTINEL NODE BIOPSY Right 04/06/2015   Procedure: RIGHT MASTECTOMY WITH SENTINEL LYMPH NODE BIOPSY;  Surgeon: Alphonsa Overall, MD;  Location: [facility];  Service: General;  Laterality: Right;  . REDUCTION MAMMAPLASTY Left 2017  . REMOVAL OF BILATERAL TISSUE EXPANDERS WITH PLACEMENT OF BILATERAL BREAST IMPLANTS Right 08/03/2015   Procedure: REMOVAL OF RIGHT TISSUE EXPANDERS WITH PLACEMENT OF IMPLANT;  Surgeon: Irene Limbo, MD;  Location: [facility];  Service: Plastics;  Laterality: Right;  . TUBAL LIGATION      Current Outpatient Medications on File Prior to Visit  Medication Sig Dispense Refill  . gabapentin (NEURONTIN) 300 MG capsule Take 1 capsule (300 mg total) by mouth at bedtime. 90 capsule 4  . methocarbamol (ROBAXIN) 500 MG tablet Take by mouth.    . phentermine 37.5 MG capsule TAKE 1 CAPSULE BY MOUTH EVERY DAY FOR  14 DAYS.  0  . tamoxifen (NOLVADEX) 20 MG tablet Take 1 tablet (20 mg total) by mouth daily. 90 tablet 4  . valACYclovir (VALTREX) 500 MG tablet Take 1 tablet (500 mg total) by mouth daily. 90 tablet 4   No current facility-administered medications on file prior to visit.     No Known Allergies  Social History   Socioeconomic History  . Marital status: Married    Spouse name: Not on file  . Number of children: Not on file  . Years of education: Not on file  . Highest education level: Not on file  Occupational History  . Not on file  Social Needs  . Financial resource strain: Not on file  . Food insecurity:    Worry: Not on file     Inability: Not on file  . Transportation needs:    Medical: Not on file    Non-medical: Not on file  Tobacco Use  . Smoking status: Never Smoker  . Smokeless tobacco: Never Used  Substance and Sexual Activity  . Alcohol use: Yes    Alcohol/week: 2.4 - 3.0 oz    Types: 4 - 5 Standard drinks or equivalent per week    Comment: wine and beer on weekends  . Drug use: No  . Sexual activity: Yes    Birth control/protection: Surgical  Lifestyle  . Physical activity:    Days per week: Not on file    Minutes per session: Not on file  . Stress: Not on file  Relationships  . Social connections:    Talks on phone: Not on file    Gets together: Not on file    Attends religious service: Not on file    Active member of club or organization: Not on file    Attends meetings of clubs or organizations: Not on file    Relationship status: Not on file  . Intimate partner violence:    Fear of current or ex partner: Not on file    Emotionally abused: Not on file    Physically abused: Not on file    Forced sexual activity: Not on file  Other Topics Concern  . Not on file  Social History Narrative  . Not on file    Family History  Problem Relation Age of Onset  . Diabetes Mother   . Hypertension Mother   . Spinal muscular atrophy Mother   . Irregular heart beat Mother   . Anxiety disorder Mother   . Arthritis Mother   . Cancer Other        maternal great grandfather dx. unspecified type cancer  . Heart attack Maternal Grandmother   . Heart attack Maternal Uncle   . Cancer Cousin        paternal 1st cousin dx. with cancer that was typically a childhood cancer  . Breast cancer Neg Hx     The following portions of the patient's history were reviewed and updated as appropriate: allergies, current medications, past family history, past medical history, past social history, past surgical history and problem list.  Review of Systems Pertinent items noted in HPI and remainder of  comprehensive ROS otherwise negative.   Objective:  BP (!) 139/97 (BP Location: Left Arm, Cuff Size: Large)   Pulse 60   Temp 97.9 F (36.6 C)   Ht 5\' 11"  (1.803 m)   Wt 234 lb 4.8 oz (106.3 kg)   LMP 04/05/2016 (Approximate)   BMI 32.68 kg/m  CONSTITUTIONAL: Well-developed, well-nourished female  in no acute distress.  HENT:  Normocephalic, atraumatic, External right and left ear normal.  EYES: Conjunctivae and EOM are normal. Pupils are equal, round. No scleral icterus.  NECK: Normal range of motion, supple, no masses.  Normal thyroid.  SKIN: Skin is warm and dry. No rash noted. Not diaphoretic. No erythema. No pallor. NEUROLOGIC: Alert and oriented to person, place, and time. Normal muscle tone coordination. No cranial nerve deficit noted. PSYCHIATRIC: Normal mood and affect. Normal behavior. Normal judgment and thought content. CARDIOVASCULAR: Normal heart rate noted, regular rhythm RESPIRATORY: Clear to auscultation bilaterally. Effort and breath sounds normal, no problems with respiration noted. BREASTS: Client declined exam ABDOMEN: Soft, normal bowel sounds, no distention noted.  No tenderness, rebound or guarding.  PELVIC: Normal appearing external genitalia; normal appearing vaginal mucosa and cervix.  No abnormal discharge noted.  Pap smear obtained.  Exam limited by habitus - unable to size uterus - no masses palpated. MUSCULOSKELETAL: Normal range of motion. No tenderness.  No cyanosis, clubbing, or edema.   Assessment and Plan:  1. Encounter for annual routine gynecological examination  - Cytology - PAP - CT VIRTUAL COLONOSCOPY SCREENING; Future  Will follow up results of pap smear and manage accordingly. Routine preventative health maintenance measures emphasized. Please refer to After Visit Summary for other counseling recommendations.    Earlie Server, RN, MSN, NP-BC Nurse Practitioner, [place] for [facility]

## 2017-08-01 LAB — CYTOLOGY - PAP
Diagnosis: NEGATIVE
HPV: NOT DETECTED

## 2017-12-05 ENCOUNTER — Ambulatory Visit: Payer: Self-pay

## 2017-12-05 NOTE — Telephone Encounter (Signed)
Pt c/o mild dizziness for the past 2 days. Pt also having nausea and diarrhea for the past 2 days. Pt denies dry mouth and pt stated that she voided this morning. Pt stated that she is tolerating water and keeping it down. Pt denies any weakness or numbness to the face, arms or legs.  Standing makes the dizziness worse. Care advice given and pt verbalized understanding. No office visit openings until Friday. Pt wanting to be seen today. Checked schedule of all the providers and found appointment for tomorrow morning. Pt refused appointment. Advised pt to go to Urgent Care to be evaluated.   Reason for Disposition . [1] MILD dizziness (e.g., walking normally) AND [2] has NOT been evaluated by physician for this  (Exception: dizziness caused by heat exposure, sudden standing, or poor fluid intake)  Answer Assessment - Initial Assessment Questions 1. DESCRIPTION: "Describe your dizziness."     Loses balance and feels lightheaded, tired 2. LIGHTHEADED: "Do you feel lightheaded?" (e.g., somewhat faint, woozy, weak upon standing)     yes 3. VERTIGO: "Do you feel like either you or the room is spinning or tilting?" (i.e. vertigo)     no 4. SEVERITY: "How bad is it?"  "Do you feel like you are going to faint?" "Can you stand and walk?"   - MILD - walking normally   - MODERATE - interferes with normal activities (e.g., work, school)    - SEVERE - unable to stand, requires support to walk, feels like passing out now.      mild 5. ONSET:  "When did the dizziness begin?"     2 days ago 6. AGGRAVATING FACTORS: "Does anything make it worse?" (e.g., standing, change in head position)     standing 7. HEART RATE: "Can you tell me your heart rate?" "How many beats in 15 seconds?"  (Note: not all patients can do this)       Pt doesn't know how 8. CAUSE: "What do you think is causing the dizziness?"     Maybe nausea or diarrhea 9. RECURRENT SYMPTOM: "Have you had dizziness before?" If so, ask: "When was the  last time?" "What happened that time?"     no 10. OTHER SYMPTOMS: "Do you have any other symptoms?" (e.g., fever, chest pain, vomiting, diarrhea, bleeding)       Diarrhea and nausea 11. PREGNANCY: "Is there any chance you are pregnant?" "When was your last menstrual period?"       n/a  Protocols used: DIZZINESS [facility]

## 2017-12-05 NOTE — Telephone Encounter (Signed)
Noted  

## 2017-12-28 ENCOUNTER — Telehealth: Payer: Self-pay | Admitting: Oncology

## 2017-12-28 NOTE — Telephone Encounter (Signed)
Tried calling patient to reschedule appointment per 12/6 voicemail log.

## 2018-05-03 ENCOUNTER — Telehealth: Payer: Self-pay | Admitting: Oncology

## 2018-05-03 NOTE — Telephone Encounter (Signed)
Spoke with patient re: appt on 05/07/18 to let her know it has been changed to virtual mtg.  Sent email with step-by-step instructions how to download and access mtg.

## 2018-05-04 NOTE — Progress Notes (Signed)
Brooke [name]  [phone] [phone]     ID: Brooke [name] DOB: [birth year]  MR#: [id number]  [facility]#:[id number]  Patient Care Team: Patient, No Pcp Per as PCP - General (General Practice) Audley Hinojos, Virgie Dad, MD as Consulting Physician (Oncology) Thea Silversmith, MD as Consulting Physician (Radiation Oncology) Alphonsa Overall, MD as Consulting Physician (General Surgery) Sylvan Cheese, NP as Nurse Practitioner (Hematology and Oncology) Irene Limbo, MD as Consulting Physician (Plastic Surgery) PCP: Patient, No Pcp Per OTHER MD: [facility]  I connected with Brooke [name] on _0 @ at  1:30 PM EDT by video enabled telemedicine visit and verified that I am speaking with the correct person using two identifiers.   I discussed the limitations, risks, security and privacy concerns of performing an evaluation and management service by telemedicine and the availability of in-person appointments. I also discussed with the patient that there may be a patient responsible charge related to this service. The patient expressed understanding and agreed to proceed.   Other persons participating in the visit and their role in the encounter: Wilburn Mylar, scribe   Patient's location: home  Provider's location: [place]: Estrogen receptor positive breast cancer  CURRENT TREATMENT: Tamoxifen   INTERVAL HISTORY: [name] returns today for follow-up of her estrogen receptor positive breast cancer.   She continues on tamoxifen, with good tolerance. She reports hot flashes, easily bruising, and pain in her "upper stomach." She notes the hot flashes are so bad that she wakes up soaked in sweat.   Since her last visit, she has not undergone any additional studies.    REVIEW OF SYSTEMS: Nilsa reports doing okay overall. She states she is working from home due to the pandemic, but she notes she is not handling it  very well. She reports depression and stress. She exercises at home. The patient denies unusual headaches, visual changes, nausea, vomiting, stiff neck, dizziness, or gait imbalance. There has been no cough, phlegm production, or pleurisy, no chest pain or pressure, and no change in bowel or bladder habits. The patient denies fever, rash, bleeding, unexplained fatigue or unexplained weight loss. A detailed review of systems was otherwise entirely negative.   BREAST CANCER HISTORY: From the original intake note:  Brooke [name] had routine gynecologic follow-up with Dr. Caralee Ates office and as part of that exam she had screening bilateral mammography which showed a possible mass and calcifications in the right breast. She was referred to the [facility] where on 02/12/2015 she had a right diagnostic mammography with tomo synthesis and right breast ultrasonography. In the upper inner quadrant of the right breast there was a spiculated mass measuring approximately 1.5 cm. Posterior to this mass, approximately 5.5 cm away, there was a subtle area of pleomorphic calcifications spanning 1 cm. By physical exam the upper inner quadrant right breast mass was palpable 2-3 cm from the nipple. By ultrasound this proved to be irregular and heterogeneous and measuring approximately 2.2 cm. The right axilla was benign.  On 02/16/2015 the patient underwent biopsy of the right breast mass in question and also a more posterior area of calcifications, not seen on the original mammogram, 10 cm away from the biopsied mass. 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 Biopsy  of the more posterior area of calcifications also showed invasive as well as in situ ductal carcinoma, grade 1 or 2, estrogen  receptor 95% positive, with strong staining intensity, progesterone receptor 45% positive, with moderate staining intensity, with an MIB-1 of 25%, and HER-2 also negative, with a signals ratio of 1.48 and the number per cell being 2.00.  The patient's subsequent history is as detailed below.   PAST MEDICAL HISTORY: Past Medical History:  Diagnosis Date  . Anemia   . Anxiety   . Breast cancer of upper-inner quadrant of right female breast ([place]) 02/18/2015  . Chronic headaches   . Depression   . Fatigue   . Hot flashes   . Yeast infection     PAST SURGICAL HISTORY: Past Surgical History:  Procedure Laterality Date  . BREAST LUMPECTOMY Left 2017  . BREAST LUMPECTOMY WITH RADIOACTIVE SEED LOCALIZATION Left 04/06/2015  . BREAST LUMPECTOMY WITH RADIOACTIVE SEED LOCALIZATION Left 04/06/2015   Procedure: LEFT BREAST LUMPECTOMY WITH RADIOACTIVE SEED LOCALIZATION;  Surgeon: Alphonsa Overall, MD;  Location: [facility];  Service: General;  Laterality: Left;  . BREAST RECONSTRUCTION WITH PLACEMENT OF TISSUE EXPANDER AND FLEX HD (ACELLULAR HYDRATED DERMIS) Right 04/06/2015   Procedure: RIGHT BREAST RECONSTRUCTION WITH PLACEMENT OF TISSUE EXPANDER, POSSIBLE ACELLULAR DERMIS;  Surgeon: Irene Limbo, MD;  Location: [facility];  Service: Plastics;  Laterality: Right;  . BREAST REDUCTION SURGERY Left 08/03/2015   Procedure: LEFT BREAST REDUCTION FOR SYMMETRY  (BREAST);  Surgeon: Irene Limbo, MD;  Location: [facility];  Service: Plastics;  Laterality: Left;  . CHOLECYSTECTOMY    . GASTRIC BYPASS    . LIPOSUCTION WITH LIPOFILLING N/A 08/03/2015   Procedure: LIPOSUCTION WITH LIPOFILLING ;  Surgeon: Irene Limbo, MD;  Location: [facility];  Service: Plastics;  Laterality: N/A;  . MASTECTOMY Right 2017  . MASTECTOMY W/ SENTINEL NODE BIOPSY Right   . MASTECTOMY W/ SENTINEL NODE BIOPSY Right 04/06/2015   Procedure: RIGHT MASTECTOMY WITH SENTINEL LYMPH NODE BIOPSY;  Surgeon: Alphonsa Overall, MD;  Location: [facility];  Service: General;  Laterality: Right;  . REDUCTION MAMMAPLASTY Left 2017  . REMOVAL OF BILATERAL TISSUE EXPANDERS WITH PLACEMENT OF BILATERAL BREAST IMPLANTS Right 08/03/2015   Procedure: REMOVAL OF RIGHT TISSUE EXPANDERS WITH PLACEMENT OF IMPLANT;  Surgeon: Irene Limbo, MD;  Location: [facility];  Service: Plastics;  Laterality: Right;  . TUBAL LIGATION      FAMILY HISTORY Family History  Problem Relation Age of Onset  . Diabetes Mother   . Hypertension Mother   . Spinal muscular atrophy Mother   . Irregular heart beat Mother   . Anxiety disorder Mother   . Arthritis Mother   . Cancer Other        maternal great grandfather dx. unspecified type cancer  . Heart attack Maternal Grandmother   . Heart attack Maternal Uncle   . Cancer Cousin        paternal 1st cousin dx. with cancer that was typically a childhood cancer  . Breast cancer Neg Hx    the patient has little information on her father's side of the family. Her mother died of complications of nonalcoholic cirrhosis of the age of 81. The patient had 4 brothers, 2 sisters. A maternal great-grandfather reportedly had cancer of some type, but she has no further information.  GYNECOLOGIC HISTORY:  Patient's last menstrual period was 04/05/2016 (approximate).  menarche age 58, first live birth age 41, the patient is [place] P2. She  has periods fairly irregularly at present and they last about 3 days. She used Depo Provera last in 2000.   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 Her husband [name] is a Customer service manager. Son Phillips Odor (pronounced "X--avier") is a Environmental consultant at [employer] in [place]. Daughter [name], is now attending [employer]. There are no grandchildren. The patient attends the [employer] church in [place]: not in place    HEALTH MAINTENANCE:  Social History   Tobacco Use  . Smoking status: Never Smoker  . Smokeless tobacco: Never Used  Substance Use Topics  . Alcohol use: Yes    Alcohol/week: 4.0 - 5.0 standard drinks    Types: 4 - 5 Standard drinks or equivalent per week    Comment: wine and beer on weekends  . Drug use: No     Colonoscopy: never   PAP: 07/28/2017, negative  Bone density: never   Lipid panel:  No Known Allergies  Current Outpatient Medications  Medication Sig Dispense Refill  . gabapentin (NEURONTIN) 300 MG capsule Take 1 capsule (300 mg total) by mouth at bedtime. 90 capsule 4  . methocarbamol (ROBAXIN) 500 MG tablet Take by mouth.    . phentermine 37.5 MG capsule TAKE 1 CAPSULE BY MOUTH EVERY DAY FOR 14 DAYS.  0  . tamoxifen (NOLVADEX) 20 MG tablet Take 1 tablet (20 mg total) by mouth daily. 90 tablet 4  . valACYclovir (VALTREX) 500 MG tablet Take 1 tablet (500 mg total) by mouth daily. 90 tablet 4   No current facility-administered medications for this visit.     OBJECTIVE: middle-aged African-American woman who appears stated age There were no vitals filed for this visit.   There is no height or weight on file to calculate BMI.    ECOG FS:0 - Asymptomatic   LAB RESULTS:  CMP     Component Value Date/Time   NA 138 04/25/2017 1248   NA 141 06/22/2016 0858   K 4.4 04/25/2017 1248   K 4.4 06/22/2016 0858   CL 107 04/25/2017 1248   CO2 25 04/25/2017 1248   CO2 22 06/22/2016 0858   GLUCOSE 96 04/25/2017 1248   GLUCOSE 110 06/22/2016 0858   BUN 11 04/25/2017 1248   BUN 11.8 06/22/2016 0858   CREATININE 0.95 04/25/2017 1248   CREATININE 0.8 06/22/2016 0858   CALCIUM 8.7 04/25/2017 1248   CALCIUM 9.1 06/22/2016 0858   PROT 6.8 04/25/2017 1248   PROT 7.1 06/22/2016 0858   ALBUMIN 3.5 04/25/2017 1248   ALBUMIN 3.5 06/22/2016 0858   AST 23 04/25/2017 1248   AST 48 (H) 06/22/2016 0858   ALT 28 04/25/2017 1248   ALT 35 06/22/2016 0858   ALKPHOS 83 04/25/2017 1248   ALKPHOS 109  06/22/2016 0858   BILITOT 0.3 04/25/2017 1248   BILITOT 0.78 06/22/2016 0858   GFRNONAA >60 04/25/2017 1248   GFRNONAA >89 03/14/2014 1354   GFRAA >60 04/25/2017 1248   GFRAA >89 03/14/2014 1354    INo results found for: SPEP, UPEP  Lab Results  Component Value Date   WBC 4.6 04/25/2017   NEUTROABS 2.3 04/25/2017   HGB 11.4 (L) 04/25/2017   HCT 35.0 04/25/2017   MCV 92.8 04/25/2017   PLT 263 04/25/2017      Chemistry      Component Value Date/Time   NA 138 04/25/2017 1248   NA 141 06/22/2016  0858   K 4.4 04/25/2017 1248   K 4.4 06/22/2016 0858   CL 107 04/25/2017 1248   CO2 25 04/25/2017 1248   CO2 22 06/22/2016 0858   BUN 11 04/25/2017 1248   BUN 11.8 06/22/2016 0858   CREATININE 0.95 04/25/2017 1248   CREATININE 0.8 06/22/2016 0858      Component Value Date/Time   CALCIUM 8.7 04/25/2017 1248   CALCIUM 9.1 06/22/2016 0858   ALKPHOS 83 04/25/2017 1248   ALKPHOS 109 06/22/2016 0858   AST 23 04/25/2017 1248   AST 48 (H) 06/22/2016 0858   ALT 28 04/25/2017 1248   ALT 35 06/22/2016 0858   BILITOT 0.3 04/25/2017 1248   BILITOT 0.78 06/22/2016 0858       No results found for: LABCA2  No components found for: LABCA125  No results for input(s): INR in the last 168 hours.  Urinalysis    Component Value Date/Time   BILIRUBINUR neg 05/03/2012 0954   PROTEINUR neg 05/03/2012 0954   UROBILINOGEN 0.2 05/03/2012 0954   NITRITE positive 05/03/2012 0954   LEUKOCYTESUR Trace 05/03/2012 0954     ELIGIBLE FOR AVAILABLE RESEARCH PROTOCOL: no  STUDIES: No results found.  ASSESSMENT: 53 y.o. [place] woman s/p biopsy 02/17/2015 of 2 places in her right breast upper inner quadrant, both showing invasive ductal carcinoma, clinically T2 NX, stage II, estrogen receptor and progesterone receptor strongly positive, HER-2/neu nonamplified, with an MIB-1 of 25%  (1) genetics testing 03/06/2015 through theBreast/Ovarian Cancer Panel offered by[facility] Laboratoriesfound  no deleterious mutations in ATM, BARD1, BRCA1, BRCA2, BRIP1, CDH1, CHEK2, FANCC, MLH1, MSH2, MSH6, NBN, PALB2, PMS2, PTEN, RAD51C, RAD51D, TP53, and XRCC2. This panel also includes deletion/duplication analysis (without sequencing) for one gene, EPCAM.   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    (2) Oncotype DX score of 5 predicts an outside the breast risk of recurrence within 10 years of 5% if the patient's only systemic therapy is tamoxifen for 5 years. It also predicts no benefit from chemotherapy  (3) left breast biopsy 03/10/2015 shows an area of atypical ductal hyperplasia  (a) left lumpectomy for 03/23/2015 showed only usual ductal hyperplasia, no atypia or tumor  (4) right mastectomy with sentinel lymph node sampling 04/06/2015 shows an mpT1C pN0, stage IA invasive ductal carcinoma, grade 2 with negative margins, repeat HER-2 (2) again negative  (a) status post right saline implant placement and left reduction mammoplasty 08/03/2015 with benign pathology  (5) adjuvant radiation not indicated  (6) tamoxifen started neoadjuvantly on 02/25/2015  PLAN: Ahnyla is now 3 years out from definitive surgery for breast cancer with no evidence of disease recurrence.  This is very favorable.  She is tolerating tamoxifen moderately well.  She is still having significant hot flashes.  She is also complaining of feeling depressed, being cooped up at home, and stress from all the [employer].  We are going to add venlafaxine 75 mg to see if this is helpful in both those problems.  If not we will increase the dose to 150 mg  I complemented her on her good exercise program  We are adding omeprazole for her reflux symptoms.  I am concerned that she is having bruising on the inside of her thighs.  This is unlikely to be casual minimal trauma.  I am going to ask her to stop by next week just to make sure her  platelet count and clotting parameters  are adequate   Otherwise she will have mammography early June and see me again late June  She knows to call for any other issue that may develop before the next visit.  Erik Nessel, Virgie Dad, MD  05/04/18 12:05 PM Medical Oncology and Hematology [facility] [address] [place], [place] [address] Tel. [phone]    Fax. [phone]    I, Wilburn Mylar, am acting as scribe for Dr. Virgie Dad. Fleurette Woolbright.  I, Lurline Del MD, have reviewed the above documentation for accuracy and completeness, and I agree with the above.

## 2018-05-07 ENCOUNTER — Other Ambulatory Visit

## 2018-05-07 ENCOUNTER — Inpatient Hospital Stay: Payer: BC Managed Care – PPO | Attending: Oncology | Admitting: Oncology

## 2018-05-07 DIAGNOSIS — Z170 Estrogen receptor positive status [ER+]: Secondary | ICD-10-CM | POA: Diagnosis not present

## 2018-05-07 DIAGNOSIS — Z9011 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: Secondary | ICD-10-CM | POA: Diagnosis not present

## 2018-05-07 DIAGNOSIS — Z79899 Other long term (current) drug therapy: Secondary | ICD-10-CM

## 2018-05-07 DIAGNOSIS — C50211 Malignant neoplasm of upper-inner quadrant of right female breast: Secondary | ICD-10-CM | POA: Insufficient documentation

## 2018-05-07 DIAGNOSIS — Z79810 Long term (current) use of selective estrogen receptor modulators (SERMs): Secondary | ICD-10-CM | POA: Diagnosis not present

## 2018-05-07 MED ORDER — VENLAFAXINE HCL ER 75 MG PO CP24: 75.0000 mg | ORAL_CAPSULE | Freq: Every day | ORAL | 4 refills | Status: DC

## 2018-05-07 MED ORDER — OMEPRAZOLE 40 MG PO CPDR: 40.0000 mg | DELAYED_RELEASE_CAPSULE | Freq: Every day | ORAL | 2 refills | Status: DC

## 2018-05-08 ENCOUNTER — Other Ambulatory Visit: Payer: Self-pay | Admitting: Oncology

## 2018-05-09 ENCOUNTER — Other Ambulatory Visit: Payer: Self-pay | Admitting: Oncology

## 2018-05-09 DIAGNOSIS — Z1231 Encounter for screening mammogram for malignant neoplasm of breast: Secondary | ICD-10-CM

## 2018-05-14 ENCOUNTER — Inpatient Hospital Stay: Payer: BC Managed Care – PPO

## 2018-05-16 ENCOUNTER — Telehealth: Payer: Self-pay | Admitting: Oncology

## 2018-05-16 NOTE — Telephone Encounter (Signed)
Patient called back to schedule lab appt. Confirmed date and time

## 2018-05-16 NOTE — Telephone Encounter (Signed)
Called patient per sch msg to see when she would like to reschedule lab from 5/11 or if she would like to wait until her 6/15 appt. No answer, left msg with call back number.

## 2018-05-23 ENCOUNTER — Other Ambulatory Visit: Payer: Self-pay

## 2018-05-23 ENCOUNTER — Inpatient Hospital Stay: Payer: BC Managed Care – PPO

## 2018-05-23 DIAGNOSIS — Z170 Estrogen receptor positive status [ER+]: Secondary | ICD-10-CM

## 2018-05-23 DIAGNOSIS — C50211 Malignant neoplasm of upper-inner quadrant of right female breast: Secondary | ICD-10-CM

## 2018-05-23 DIAGNOSIS — Z79810 Long term (current) use of selective estrogen receptor modulators (SERMs): Secondary | ICD-10-CM | POA: Diagnosis not present

## 2018-05-23 DIAGNOSIS — Z9011 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: Secondary | ICD-10-CM | POA: Diagnosis not present

## 2018-05-23 LAB — COMPREHENSIVE METABOLIC PANEL
ALT: 23 U/L (ref 0–44)
AST: 26 U/L (ref 15–41)
Albumin: 4 g/dL (ref 3.5–5.0)
Alkaline Phosphatase: 73 U/L (ref 38–126)
Anion gap: 9 (ref 5–15)
BUN: 13 mg/dL (ref 6–20)
CO2: 25 mmol/L (ref 22–32)
Calcium: 9.1 mg/dL (ref 8.9–10.3)
Chloride: 106 mmol/L (ref 98–111)
Creatinine, Ser: 0.84 mg/dL (ref 0.44–1.00)
GFR calc Af Amer: >60 mL/min (ref >60)
GFR calc non Af Amer: >60 mL/min (ref >60)
Glucose, Bld: 103 mg/dL — ABNORMAL HIGH (ref 70–99)
Potassium: 4 mmol/L (ref 3.5–5.1)
Sodium: 140 mmol/L (ref 135–145)
Total Bilirubin: 0.4 mg/dL (ref 0.3–1.2)
Total Protein: 7.2 g/dL (ref 6.5–8.1)

## 2018-05-23 LAB — CBC WITH DIFFERENTIAL/PLATELET
Abs Immature Granulocytes: 0.01 10*3/uL (ref 0.00–0.07)
Basophils Absolute: 0.1 10*3/uL (ref 0.0–0.1)
Basophils Relative: 1 %
Eosinophils Absolute: 0.4 10*3/uL (ref 0.0–0.5)
Eosinophils Relative: 7 %
HCT: 36.6 % (ref 36.0–46.0)
Hemoglobin: 11.8 g/dL — ABNORMAL LOW (ref 12.0–15.0)
Immature Granulocytes: 0 %
Lymphocytes Relative: 34 %
Lymphs Abs: 1.7 10*3/uL (ref 0.7–4.0)
MCH: 31.3 pg (ref 26.0–34.0)
MCHC: 32.2 g/dL (ref 30.0–36.0)
MCV: 97.1 fL (ref 80.0–100.0)
Monocytes Absolute: 0.5 10*3/uL (ref 0.1–1.0)
Monocytes Relative: 10 %
Neutro Abs: 2.4 10*3/uL (ref 1.7–7.7)
Neutrophils Relative %: 48 %
Platelets: 335 10*3/uL (ref 150–400)
RBC: 3.77 MIL/uL — ABNORMAL LOW (ref 3.87–5.11)
RDW: 15.1 % (ref 11.5–15.5)
WBC: 4.9 10*3/uL (ref 4.0–10.5)
nRBC: 0 % (ref 0.0–0.2)

## 2018-05-23 LAB — APTT: aPTT: 24 seconds (ref 24–36)

## 2018-05-23 LAB — PROTIME-INR
INR: 0.9 (ref 0.8–1.2)
Prothrombin Time: 12.4 seconds (ref 11.4–15.2)

## 2018-05-23 LAB — SAVE SMEAR(SSMR), FOR PROVIDER SLIDE REVIEW

## 2018-06-17 NOTE — Progress Notes (Signed)
[place]  [phone] [phone]     ID: Brooke [name] DOB: January 06, 1965  MR#: [id number]  IRS#:[id number]  Patient Care Team: Brooke [name] as PCP - General (General Practice) Brooke [name], Brooke Dad, MD as Consulting Physician (Oncology) Brooke Silversmith, MD as Consulting Physician (Radiation Oncology) Brooke Overall, MD as Consulting Physician (General Surgery) Brooke Cheese, NP as Nurse Practitioner (Hematology and Oncology) Brooke Limbo, MD as Consulting Physician (Plastic Surgery) PCP: Brooke [name] OTHER MD: [name]   CHIEF COMPLAINT: Estrogen receptor positive breast cancer  CURRENT TREATMENT: Tamoxifen   INTERVAL HISTORY: Brooke [name] was seen today for follow-up and treatment of her estrogen receptor positive breast cancer.   She continues on tamoxifen. She is having hot flashes with extreme diaphoresis, particularly. She took gabapentin at night, but she did not try venlafaxine.   She is scheduled for unilateral left screening mammography 07/13/2018.  Since her last visit here, has not undergone any additional studies.     REVIEW OF SYSTEMS: Brooke [name] states that she has been fatigued. She also notes bruising very easily around her stomach. She says she has lost some weight, but she has been working out; she likes to walk and do cardio. She has also been watching what she eats. She does note some depression because of the lack of social activities due to COVID-19. She is at home with her daughter and her husband. The patient denies unusual headaches, visual changes, nausea, vomiting, or dizziness. There has been no unusual cough, phlegm production, or pleurisy. This been no change in bowel or bladder habits. The patient denies rash, or fever. A detailed review of systems was otherwise noncontributory.   Wt Readings from Last 3 Encounters:  06/18/18 215 lb 6.4 oz (97.7 kg)  07/28/17 234 lb 4.8 oz (106.3 kg)   04/25/17 254 lb (115.2 kg)    BREAST CANCER HISTORY: From the original intake note:  Brooke [name] had routine gynecologic follow-up with Dr. Caralee [name] office and as part of that exam she had screening bilateral mammography which showed a possible mass and calcifications in the right breast. She was referred to the [facility] where on 02/12/2015 she had a right diagnostic mammography with tomo synthesis and right breast ultrasonography. In the upper inner quadrant of the right breast there was a spiculated mass measuring approximately 1.5 cm. Posterior to this mass, approximately 5.5 cm away, there was a subtle area of pleomorphic calcifications spanning 1 cm. By physical exam the upper inner quadrant right breast mass was palpable 2-3 cm from the nipple. By ultrasound this proved to be irregular and heterogeneous and measuring approximately 2.2 cm. The right axilla was benign.  On 02/16/2015 the patient underwent biopsy of the right breast mass in question and also a more posterior area of calcifications, not seen on the original mammogram, 10 cm away from the biopsied mass. Biopsy of the mass confirmed an invasive ductal carcinoma, grade 1, estrogen receptor 95% positive, progesterone receptor 95% positive, both with strong staining intensity, with an MIB-1 of 25%, and no HER-2 implication, the signals ratio being 1.43 and the number [name] cell 2.00. Biopsy of the more posterior area of calcifications also showed invasive as well as in situ ductal carcinoma, grade 1 or 2, estrogen receptor 95% positive, with strong staining intensity, progesterone receptor 45% positive, with moderate staining intensity, with an MIB-1 of 25%, and HER-2 also negative, with a signals ratio of 1.48 and the number [name] cell being 2.00.  The patient's subsequent history is as detailed below.   PAST MEDICAL HISTORY: Past Medical History:  Diagnosis Date  . Anemia   . Anxiety   . Breast cancer of upper-inner quadrant of right  female breast ([place]) 02/18/2015  . Chronic headaches   . Depression   . Fatigue   . Hot flashes   . Yeast infection     PAST SURGICAL HISTORY: Past Surgical History:  Procedure Laterality Date  . BREAST LUMPECTOMY Left 2017  . BREAST LUMPECTOMY WITH RADIOACTIVE SEED LOCALIZATION Left 04/06/2015  . BREAST LUMPECTOMY WITH RADIOACTIVE SEED LOCALIZATION Left 04/06/2015   Procedure: LEFT BREAST LUMPECTOMY WITH RADIOACTIVE SEED LOCALIZATION;  Surgeon: Brooke Overall, MD;  Location: [facility];  Service: General;  Laterality: Left;  . BREAST RECONSTRUCTION WITH PLACEMENT OF TISSUE EXPANDER AND FLEX HD (ACELLULAR HYDRATED DERMIS) Right 04/06/2015   Procedure: RIGHT BREAST RECONSTRUCTION WITH PLACEMENT OF TISSUE EXPANDER, POSSIBLE ACELLULAR DERMIS;  Surgeon: Brooke Limbo, MD;  Location: [facility];  Service: Plastics;  Laterality: Right;  . BREAST REDUCTION SURGERY Left 08/03/2015   Procedure: LEFT BREAST REDUCTION FOR SYMMETRY  (BREAST);  Surgeon: Brooke Limbo, MD;  Location: [facility];  Service: Plastics;  Laterality: Left;  . CHOLECYSTECTOMY    . GASTRIC BYPASS    . LIPOSUCTION WITH LIPOFILLING N/A 08/03/2015   Procedure: LIPOSUCTION WITH LIPOFILLING ;  Surgeon: Brooke Limbo, MD;  Location: [facility];  Service: Plastics;  Laterality: N/A;  . MASTECTOMY Right 2017  . MASTECTOMY W/ SENTINEL NODE BIOPSY Right   . MASTECTOMY W/ SENTINEL NODE BIOPSY Right 04/06/2015   Procedure: RIGHT MASTECTOMY WITH SENTINEL LYMPH NODE BIOPSY;  Surgeon: Brooke Overall, MD;  Location: [facility];  Service: General;  Laterality: Right;  . REDUCTION MAMMAPLASTY Left 2017  . REMOVAL OF BILATERAL TISSUE EXPANDERS WITH PLACEMENT OF BILATERAL BREAST IMPLANTS Right 08/03/2015   Procedure: REMOVAL OF RIGHT TISSUE EXPANDERS WITH PLACEMENT OF IMPLANT;  Surgeon: Brooke Limbo, MD;  Location: [facility];  Service: Plastics;  Laterality: Right;  . TUBAL LIGATION      FAMILY HISTORY Family  History  Problem Relation Age of Onset  . Diabetes Mother   . Hypertension Mother   . Spinal muscular atrophy Mother   . Irregular heart beat Mother   . Anxiety disorder Mother   . Arthritis Mother   . Cancer Other        maternal great grandfather dx. unspecified type cancer  . Heart attack Maternal Grandmother   . Heart attack Maternal Uncle   . Cancer Cousin        paternal 1st cousin dx. with cancer that was typically a childhood cancer  . Breast cancer Neg Hx    the patient has little information on her father's side of the family. Her mother died of complications of nonalcoholic cirrhosis of the age of 59. The patient had 4 brothers, 2 sisters. A maternal great-grandfather reportedly had cancer of some type, but she has no further information.   GYNECOLOGIC HISTORY:  Patient's last menstrual period was 04/05/2016 (approximate).  menarche age 34, first live birth age 61, the patient is [place] P2. She has periods fairly irregularly at present and they last about 3 days. She used Depo Provera last in 2000.    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 Her husband Brooke [name] is a Customer service manager. Son Brooke [name] (pronounced "X--avier") is a Environmental consultant at [employer] in [place].  [name], is now attending [employer]. There are no grandchildren. The patient attends the [employer] church in [place]: not in place    HEALTH MAINTENANCE: Social History   Tobacco Use  . Smoking status: Never Smoker  . Smokeless tobacco: Never Used  Substance Use Topics  . Alcohol use: Yes    Alcohol/week: 4.0 - 5.0 standard drinks    Types: 4 - 5 Standard drinks or equivalent [name] week    Comment: wine and beer on weekends  . Drug use: No     Colonoscopy: never   PAP: 07/28/2017, negative  Bone density: never   Lipid panel:  No Known Allergies  Current Outpatient  Medications  Medication Sig Dispense Refill  . gabapentin (NEURONTIN) 300 MG capsule Take 1 capsule (300 mg total) by mouth at bedtime. 90 capsule 4  . tamoxifen (NOLVADEX) 20 MG tablet Take 1 tablet (20 mg total) by mouth daily. 90 tablet 4  . valACYclovir (VALTREX) 500 MG tablet TAKE 1 TABLET BY MOUTH EVERY DAY 90 tablet 4  . venlafaxine XR (EFFEXOR-XR) 75 MG 24 hr capsule Take 1 capsule (75 mg total) by mouth daily with breakfast. 90 capsule 4   No current facility-administered medications for this visit.     OBJECTIVE: middle-aged African-American woman in no acute distress Vitals:   06/18/18 1528  BP: (!) 143/89  Pulse: 69  Resp: 18  Temp: 98 F (36.7 C)  SpO2: 100%     Body mass index is 30.04 kg/m.    ECOG FS:0 - Asymptomatic  Sclerae unicteric, EOMs intact Wearing mask No cervical or supraclavicular adenopathy Lungs no rales or rhonchi Heart regular rate and rhythm Abd soft, nontender, positive bowel sounds MSK no focal spinal tenderness, no upper extremity lymphedema Neuro: nonfocal, well oriented, appropriate affect Breasts: The right breast is status post mastectomy and reconstruction.  There is no evidence of local recurrence.  Left breast is status post reduction mammoplasty.  Both axillae are benign.  LAB RESULTS:  CMP     Component Value Date/Time   NA 140 05/23/2018 0912   NA 141 06/22/2016 0858   K 4.0 05/23/2018 0912   K 4.4 06/22/2016 0858   CL 106 05/23/2018 0912   CO2 25 05/23/2018 0912   CO2 22 06/22/2016 0858   GLUCOSE 103 (H) 05/23/2018 0912   GLUCOSE 110 06/22/2016 0858   BUN 13 05/23/2018 0912   BUN 11.8 06/22/2016 0858   CREATININE 0.84 05/23/2018 0912   CREATININE 0.8 06/22/2016 0858   CALCIUM 9.1 05/23/2018 0912   CALCIUM 9.1 06/22/2016 0858   PROT 7.2 05/23/2018 0912   PROT 7.1 06/22/2016 0858   ALBUMIN 4.0 05/23/2018 0912   ALBUMIN 3.5 06/22/2016 0858   AST 26 05/23/2018 0912   AST 48 (H) 06/22/2016 0858   ALT 23 05/23/2018 0912    ALT 35 06/22/2016 0858   ALKPHOS 73 05/23/2018 0912   ALKPHOS 109 06/22/2016 0858   BILITOT 0.4 05/23/2018 0912   BILITOT 0.78 06/22/2016 0858   GFRNONAA >60 05/23/2018 0912   GFRNONAA >89 03/14/2014 1354   GFRAA >60 05/23/2018 0912   GFRAA >89 03/14/2014 1354    INo results found for: SPEP, UPEP  Lab Results  Component Value Date   WBC 4.9 05/23/2018   NEUTROABS 2.4 05/23/2018   HGB 11.8 (L) 05/23/2018   HCT 36.6 05/23/2018   MCV 97.1 05/23/2018   PLT 335 05/23/2018  Chemistry      Component Value Date/Time   NA 140 05/23/2018 0912   NA 141 06/22/2016 0858   K 4.0 05/23/2018 0912   K 4.4 06/22/2016 0858   CL 106 05/23/2018 0912   CO2 25 05/23/2018 0912   CO2 22 06/22/2016 0858   BUN 13 05/23/2018 0912   BUN 11.8 06/22/2016 0858   CREATININE 0.84 05/23/2018 0912   CREATININE 0.8 06/22/2016 0858      Component Value Date/Time   CALCIUM 9.1 05/23/2018 0912   CALCIUM 9.1 06/22/2016 0858   ALKPHOS 73 05/23/2018 0912   ALKPHOS 109 06/22/2016 0858   AST 26 05/23/2018 0912   AST 48 (H) 06/22/2016 0858   ALT 23 05/23/2018 0912   ALT 35 06/22/2016 0858   BILITOT 0.4 05/23/2018 0912   BILITOT 0.78 06/22/2016 0858       No results found for: LABCA2  No components found for: LABCA125  No results for input(s): INR in the last 168 hours.  Urinalysis    Component Value Date/Time   BILIRUBINUR neg 05/03/2012 0954   PROTEINUR neg 05/03/2012 0954   UROBILINOGEN 0.2 05/03/2012 0954   NITRITE positive 05/03/2012 0954   LEUKOCYTESUR Trace 05/03/2012 0954     ELIGIBLE FOR AVAILABLE RESEARCH PROTOCOL: no  STUDIES: No results found.  ASSESSMENT: 53 y.o. Brooke [name] woman s/p biopsy 02/17/2015 of 2 places in her right breast upper inner quadrant, both showing invasive ductal carcinoma, clinically T2 NX, stage II, estrogen receptor and progesterone receptor strongly positive, HER-2/neu nonamplified, with an MIB-1 of 25%  (1) genetics testing 03/06/2015  through theBreast/Ovarian Cancer Panel offered by[facility]x Laboratoriesfound no deleterious mutations in ATM, BARD1, BRCA1, BRCA2, BRIP1, CDH1, CHEK2, FANCC, MLH1, MSH2, MSH6, NBN, PALB2, PMS2, PTEN, RAD51C, RAD51D, TP53, and XRCC2. This panel also includes deletion/duplication analysis (without sequencing) for one gene, EPCAM.   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    (2) Oncotype DX score of 5 predicts an outside the breast risk of recurrence within 10 years of 5% if the patient's only systemic therapy is tamoxifen for 5 years. It also predicts no benefit from chemotherapy  (3) left breast biopsy 03/10/2015 shows an area of atypical ductal hyperplasia  (a) left lumpectomy for 03/23/2015 showed only usual ductal hyperplasia, no atypia or tumor  (4) right mastectomy with sentinel lymph node sampling 04/06/2015 shows an mpT1C pN0, stage IA invasive ductal carcinoma, grade 2 with negative margins, repeat HER-2 (2) again negative  (a) status post right saline implant placement and left reduction mammoplasty 08/03/2015 with benign pathology  (5) adjuvant radiation not indicated  (6) tamoxifen started neoadjuvantly on 02/25/2015  PLAN: Lakeria is now a little over 3 years out from definitive surgery for breast cancer with no evidence of disease recurrence.  This is very favorable.  She is tolerating tamoxifen moderately well.  The main issue is hot flashes and vasomotor symptoms.  She is already on gabapentin at bedtime but not is working some.  She never did start venlafaxine.  Going to go with venlafaxine 75 mg daily.  That should help both the hot flashes and the mild feeling of depression she is having because of being cooped up at home.  After a month or so if she wishes she will be able to increase 250 mg but before doing that she should call us and confirm  I reassured her that from a breast cancer point of  view she is doing very well  She knows to call for any other issue that may develop before her next visit here which will be in 1 year. Charmon Thorson, Brooke Dad, MD  06/18/18 4:11 PM Medical Oncology and Hematology [facility] [address] [place], [place] [address] Tel. [phone]    Fax. [phone]   I, Jacqualyn Posey am acting as a Education administrator for Chauncey Cruel, MD.   I, Lurline Del MD, have reviewed the above documentation for accuracy and completeness, and I agree with the above.

## 2018-06-18 ENCOUNTER — Other Ambulatory Visit: Payer: Self-pay

## 2018-06-18 ENCOUNTER — Inpatient Hospital Stay (HOSPITAL_BASED_OUTPATIENT_CLINIC_OR_DEPARTMENT_OTHER): Payer: BC Managed Care – PPO | Admitting: Oncology

## 2018-06-18 ENCOUNTER — Inpatient Hospital Stay: Payer: BC Managed Care – PPO | Attending: Oncology

## 2018-06-18 VITALS — BP 143/89 | HR 69 | Temp 98.0°F | Resp 18 | Wt 215.4 lb

## 2018-06-18 DIAGNOSIS — Z170 Estrogen receptor positive status [ER+]: Secondary | ICD-10-CM | POA: Insufficient documentation

## 2018-06-18 DIAGNOSIS — C50211 Malignant neoplasm of upper-inner quadrant of right female breast: Secondary | ICD-10-CM

## 2018-06-18 DIAGNOSIS — Z9011 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: Secondary | ICD-10-CM

## 2018-06-18 DIAGNOSIS — Z79810 Long term (current) use of selective estrogen receptor modulators (SERMs): Secondary | ICD-10-CM | POA: Diagnosis not present

## 2018-06-18 DIAGNOSIS — F329 Major depressive disorder, single episode, unspecified: Secondary | ICD-10-CM | POA: Insufficient documentation

## 2018-06-18 DIAGNOSIS — Z79899 Other long term (current) drug therapy: Secondary | ICD-10-CM | POA: Insufficient documentation

## 2018-06-18 DIAGNOSIS — R232 Flushing: Secondary | ICD-10-CM | POA: Diagnosis not present

## 2018-06-18 MED ORDER — GABAPENTIN 300 MG PO CAPS: 300.0000 mg | ORAL_CAPSULE | Freq: Every day | ORAL | 4 refills | Status: DC

## 2018-06-18 MED ORDER — TAMOXIFEN CITRATE 20 MG PO TABS: 20.0000 mg | ORAL_TABLET | Freq: Every day | ORAL | 4 refills | Status: DC

## 2018-06-18 MED ORDER — VENLAFAXINE HCL ER 75 MG PO CP24: 75.0000 mg | ORAL_CAPSULE | Freq: Every day | ORAL | 4 refills | Status: DC

## 2018-07-13 ENCOUNTER — Ambulatory Visit
Admission: RE | Admit: 2018-07-13 | Discharge: 2018-07-13 | Disposition: A | Discharge status: Home / Self Care | Payer: BC Managed Care – PPO | Source: Ambulatory Visit | Attending: Oncology | Admitting: Oncology

## 2018-07-13 DIAGNOSIS — Z1231 Encounter for screening mammogram for malignant neoplasm of breast: Secondary | ICD-10-CM

## 2018-10-22 ENCOUNTER — Ambulatory Visit: Payer: BC Managed Care – PPO | Admitting: Family Medicine

## 2018-10-22 ENCOUNTER — Other Ambulatory Visit: Payer: Self-pay

## 2018-10-22 ENCOUNTER — Ambulatory Visit (INDEPENDENT_AMBULATORY_CARE_PROVIDER_SITE_OTHER): Payer: BC Managed Care – PPO

## 2018-10-22 ENCOUNTER — Encounter: Payer: Self-pay | Admitting: Family Medicine

## 2018-10-22 VITALS — BP 162/103 | HR 69 | Temp 98.2°F | Ht 71.0 in | Wt 219.0 lb

## 2018-10-22 DIAGNOSIS — M25511 Pain in right shoulder: Secondary | ICD-10-CM

## 2018-10-22 MED ORDER — TIZANIDINE HCL 2 MG PO CAPS: 2.0000 mg | ORAL_CAPSULE | Freq: Three times a day (TID) | ORAL | 0 refills | Status: DC

## 2018-10-22 MED ORDER — MELOXICAM 7.5 MG PO TABS: 7.5000 mg | ORAL_TABLET | Freq: Every day | ORAL | 3 refills | Status: DC

## 2018-10-22 NOTE — Progress Notes (Signed)
Established Patient Office Visit  Subjective:  Patient ID: Brooke [name], female    DOB: 05/20/1965  Age: 53 y.o. MRN: [id number]  CC:  Chief Complaint  Patient presents with  . Shoulder Pain    Pt stated --rt shoulder having tightness, sore for about 1 week. Also have bruises both legs    HPI Brooke [name] presents for   Pain in the right shoulder that feels like it is in the shoulder blade but radiates down the right arm Onset 1 week The pain is down the arm and feels like it is in the neck and in the arm She denies any improvement with advil and BC powder She reports that she also feels like there is something in her throat SHE REPORTS THAT THE PAIN STARTED UNPROVOKED AND IS CONSTANT.  She has bruising on her legs and thighs She reports that her Oncologist checked her blood work and they were normal.   Past Medical History:  Diagnosis Date  . Anemia   . Anxiety   . Breast cancer of upper-inner quadrant of right female breast ([facility]) 02/18/2015  . Chronic headaches   . Depression   . Fatigue   . Hot flashes   . Yeast infection     Past Surgical History:  Procedure Laterality Date  . BREAST LUMPECTOMY Left 2017  . BREAST LUMPECTOMY WITH RADIOACTIVE SEED LOCALIZATION Left 04/06/2015  . BREAST LUMPECTOMY WITH RADIOACTIVE SEED LOCALIZATION Left 04/06/2015   Procedure: LEFT BREAST LUMPECTOMY WITH RADIOACTIVE SEED LOCALIZATION;  Surgeon: Alphonsa Overall, MD;  Location: [facility];  Service: General;  Laterality: Left;  . BREAST RECONSTRUCTION WITH PLACEMENT OF TISSUE EXPANDER AND FLEX HD (ACELLULAR HYDRATED DERMIS) Right 04/06/2015   Procedure: RIGHT BREAST RECONSTRUCTION WITH PLACEMENT OF TISSUE EXPANDER, POSSIBLE ACELLULAR DERMIS;  Surgeon: Irene Limbo, MD;  Location: [facility];  Service: Plastics;  Laterality: Right;  . BREAST REDUCTION SURGERY Left 08/03/2015   Procedure: LEFT BREAST REDUCTION FOR SYMMETRY  (BREAST);  Surgeon: Irene Limbo, MD;  Location: [facility];   Service: Plastics;  Laterality: Left;  . CHOLECYSTECTOMY    . GASTRIC BYPASS    . LIPOSUCTION WITH LIPOFILLING N/A 08/03/2015   Procedure: LIPOSUCTION WITH LIPOFILLING ;  Surgeon: Irene Limbo, MD;  Location: [facility];  Service: Plastics;  Laterality: N/A;  . MASTECTOMY Right 2017  . MASTECTOMY W/ SENTINEL NODE BIOPSY Right   . MASTECTOMY W/ SENTINEL NODE BIOPSY Right 04/06/2015   Procedure: RIGHT MASTECTOMY WITH SENTINEL LYMPH NODE BIOPSY;  Surgeon: Alphonsa Overall, MD;  Location: [facility];  Service: General;  Laterality: Right;  . REDUCTION MAMMAPLASTY Left 2017  . REMOVAL OF BILATERAL TISSUE EXPANDERS WITH PLACEMENT OF BILATERAL BREAST IMPLANTS Right 08/03/2015   Procedure: REMOVAL OF RIGHT TISSUE EXPANDERS WITH PLACEMENT OF IMPLANT;  Surgeon: Irene Limbo, MD;  Location: [facility];  Service: Plastics;  Laterality: Right;  . TUBAL LIGATION      Family History  Problem Relation Age of Onset  . Diabetes Mother   . Hypertension Mother   . Spinal muscular atrophy Mother   . Irregular heart beat Mother   . Anxiety disorder Mother   . Arthritis Mother   . Cancer Other        maternal great grandfather dx. unspecified type cancer  . Heart attack Maternal Grandmother   . Heart attack Maternal Uncle   . Cancer Cousin        paternal 1st cousin dx. with cancer that was  typically a childhood cancer  . Breast cancer Neg Hx     Social History   Socioeconomic History  . Marital status: Married    Spouse name: Not on file  . Number of children: Not on file  . Years of education: Not on file  . Highest education level: Not on file  Occupational History  . Not on file  Social Needs  . Financial resource strain: Not on file  . Food insecurity    Worry: Not on file    Inability: Not on file  . Transportation needs    Medical: Not on file    Non-medical: Not on file  Tobacco Use  . Smoking status: Never Smoker  . Smokeless tobacco: Never Used   Substance and Sexual Activity  . Alcohol use: Yes    Alcohol/week: 4.0 - 5.0 standard drinks    Types: 4 - 5 Standard drinks or equivalent per week    Comment: wine and beer on weekends  . Drug use: No  . Sexual activity: Yes    Birth control/protection: Surgical  Lifestyle  . Physical activity    Days per week: Not on file    Minutes per session: Not on file  . Stress: Not on file  Relationships  . Social Herbalist on phone: Not on file    Gets together: Not on file    Attends religious service: Not on file    Active member of club or organization: Not on file    Attends meetings of clubs or organizations: Not on file    Relationship status: Not on file  . Intimate partner violence    Fear of current or ex partner: Not on file    Emotionally abused: Not on file    Physically abused: Not on file    Forced sexual activity: Not on file  Other Topics Concern  . Not on file  Social History Narrative  . Not on file    Outpatient Medications Prior to Visit  Medication Sig Dispense Refill  . gabapentin (NEURONTIN) 300 MG capsule Take 1 capsule (300 mg total) by mouth at bedtime. 90 capsule 4  . tamoxifen (NOLVADEX) 20 MG tablet Take 1 tablet (20 mg total) by mouth daily. 90 tablet 4  . valACYclovir (VALTREX) 500 MG tablet TAKE 1 TABLET BY MOUTH EVERY DAY 90 tablet 4  . venlafaxine XR (EFFEXOR-XR) 75 MG 24 hr capsule Take 1 capsule (75 mg total) by mouth daily with breakfast. 90 capsule 4   No facility-administered medications prior to visit.     No Known Allergies  ROS Review of Systems Review of Systems  Constitutional: Negative for activity change, appetite change, chills and fever.  HENT: Negative for congestion, nosebleeds, trouble swallowing and voice change.   Respiratory: Negative for cough, shortness of breath and wheezing.   Gastrointestinal: Negative for diarrhea, nausea and vomiting.  Genitourinary: Negative for difficulty urinating, dysuria, flank  pain and hematuria.  Musculoskeletal: Negative for back pain, joint swelling and neck pain.  Neurological: Negative for dizziness, speech difficulty, light-headedness and numbness.  See HPI. All other review of systems negative.     Objective:     BP (!) 162/103 (BP Location: Right Arm, Patient Position: Sitting, Cuff Size: Normal)   Pulse 69   Temp 98.2 F (36.8 C)   Ht 5' 11" (1.803 m)   Wt 219 lb (99.3 kg)   LMP 04/05/2016 (Approximate)   SpO2 98%   BMI 30.54 kg/m  Wt Readings from Last 3 Encounters:  10/22/18 219 lb (99.3 kg)  06/18/18 215 lb 6.4 oz (97.7 kg)  07/28/17 234 lb 4.8 oz (106.3 kg)   BP Readings from Last 3 Encounters:  10/22/18 (!) 162/103  06/18/18 (!) 143/89  07/28/17 (!) 139/97    Physical Exam Constitutional:      Appearance: Normal appearance.  Cardiovascular:     Rate and Rhythm: Normal rate and regular rhythm.     Pulses: Normal pulses.  Pulmonary:     Effort: Pulmonary effort is normal. No respiratory distress.     Breath sounds: Normal breath sounds. No stridor. No wheezing, rhonchi or rales.  Chest:     Chest wall: No tenderness.  Musculoskeletal:     Right shoulder: She exhibits decreased range of motion, pain and spasm. She exhibits no tenderness, no bony tenderness, no swelling, no effusion, no crepitus, no deformity, no laceration, normal pulse and normal strength.     Left shoulder: Normal. She exhibits normal range of motion, no bony tenderness, no pain and no spasm.     Right upper arm: Normal. She exhibits no tenderness and no bony tenderness.  Neurological:     Mental Status: She is alert.  Psychiatric:        Mood and Affect: Mood normal.        Behavior: Behavior normal.        Thought Content: Thought content normal.        Judgment: Judgment normal.    EXAM: RIGHT SHOULDER - 2+ VIEW  COMPARISON:  None.  FINDINGS: Mild degenerative changes of the acromioclavicular joint are seen. No acute fracture or dislocation is  noted. No soft tissue abnormality is seen.  IMPRESSION: Degenerative change of the acromioclavicular joint without acute bony abnormality.   Electronically Signed   By: Inez Catalina M.D.   On: 10/22/2018 11:01  Health Maintenance Due  Topic Date Due  . COLONOSCOPY  03/05/2014  . INFLUENZA VACCINE  08/04/2018    There are no preventive care reminders to display for this patient.  Lab Results  Component Value Date   TSH 0.600 03/14/2014   Lab Results  Component Value Date   WBC 4.9 05/23/2018   HGB 11.8 (L) 05/23/2018   HCT 36.6 05/23/2018   MCV 97.1 05/23/2018   PLT 335 05/23/2018   Lab Results  Component Value Date   NA 140 05/23/2018   K 4.0 05/23/2018   CHLORIDE 107 06/22/2016   CO2 25 05/23/2018   GLUCOSE 103 (H) 05/23/2018   BUN 13 05/23/2018   CREATININE 0.84 05/23/2018   BILITOT 0.4 05/23/2018   ALKPHOS 73 05/23/2018   AST 26 05/23/2018   ALT 23 05/23/2018   PROT 7.2 05/23/2018   ALBUMIN 4.0 05/23/2018   CALCIUM 9.1 05/23/2018   ANIONGAP 9 05/23/2018   EGFR >90 06/22/2016   No results found for: CHOL No results found for: HDL No results found for: LDLCALC No results found for: TRIG No results found for: CHOLHDL Lab Results  Component Value Date   HGBA1C 6.2 (H) 02/02/2012      Assessment & Plan:   Problem List Items Addressed This Visit    None    Visit Diagnoses    Acute pain of right shoulder    -  Primary   Relevant Orders   DG Shoulder Right (Completed)   Arthralgia of right acromioclavicular joint       Relevant Orders   DG Shoulder Right (Completed)  Will treat muscle spasms with zanaflex Advised meloxicam with food for inflammation 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 tizanidine (ZANAFLEX) 2 MG capsule    Sig: Take 1 capsule (2 mg total) by  mouth 3 (three) times daily.    Dispense:  30 capsule    Refill:  0  . meloxicam (MOBIC) 7.5 MG tablet    Sig: Take 1 tablet (7.5 mg total) by mouth daily. With food    Dispense:  30 tablet    Refill:  3    Follow-up: Return if symptoms worsen or fail to improve.    Forrest Moron, MD

## 2018-10-22 NOTE — Patient Instructions (Addendum)
If you have lab work done today you will be contacted with your lab results within the next 2 weeks.  If you have not heard from [place] then please contact us. The fastest way to get your results is to register for My Chart.   IF you received an x-ray today, you will receive an invoice from [facility]. Please contact [facility] at [phone] with questions or concerns regarding your invoice.   IF you received labwork today, you will receive an invoice from [facility]. Please contact [facility] at [phone] with questions or concerns regarding your invoice.   Our billing staff will not be able to assist you with questions regarding bills from these companies.  You will be contacted with the lab results as soon as they are available. The fastest way to get your results is to activate your My Chart account. Instructions are located on the last page of this paperwork. If you have not heard from [place] regarding the results in 2 weeks, please contact this office.      Shoulder Pain Many things can cause shoulder pain, including:  An injury to the shoulder.  Overuse of the shoulder.  Arthritis. The source of the pain can be:  Inflammation.  An injury to the shoulder joint.  An injury to a tendon, ligament, or bone. Follow these instructions at home: Pay attention to changes in your symptoms. Let your health care provider know about them. Follow these instructions to relieve your pain. If you have a sling:  Wear the sling as told by your health care provider. Remove it only as told by your health care provider.  Loosen the sling if your fingers tingle, become numb, or turn cold and blue.  Keep the sling clean.  If the sling is not waterproof: ? Do not let it get wet. Remove it to shower or bathe.  Move your arm as little as possible, but keep your hand moving to prevent swelling. Managing pain, stiffness, and swelling   If directed, put ice on the painful  area: ? Put ice in a plastic bag. ? Place a towel between your skin and the bag. ? Leave the ice on for 20 minutes, 2-3 times per day. Stop applying ice if it does not help with the pain.  Squeeze a soft ball or a foam pad as much as possible. This helps to keep the shoulder from swelling. It also helps to strengthen the arm. General instructions  Take over-the-counter and prescription medicines only as told by your health care provider.  Keep all follow-up visits as told by your health care provider. This is important. Contact a health care provider if:  Your pain gets worse.  Your pain is not relieved with medicines.  New pain develops in your arm, hand, or fingers. Get help right away if:  Your arm, hand, or fingers: ? Tingle. ? Become numb. ? Become swollen. ? Become painful. ? Turn white or blue. Summary  Shoulder pain can be caused by an injury, overuse, or arthritis.  Pay attention to changes in your symptoms. Let your health care provider know about them.  This condition may be treated with a sling, ice, and pain medicines.  Contact your health care provider if the pain gets worse or new pain develops. Get help right away if your arm, hand, or fingers tingle or become numb, swollen, or painful.  Keep all follow-up visits as told by your health care provider. This is important. This  information is not intended to replace advice given to you by your health care provider. Make sure you discuss any questions you have with your health care provider. Document Released: 09/29/2004 Document Revised: 07/04/2017 Document Reviewed: 07/04/2017 Elsevier Patient Education  2020 [employer].

## 2018-11-21 ENCOUNTER — Ambulatory Visit: Payer: BC Managed Care – PPO | Admitting: Family Medicine

## 2018-11-30 ENCOUNTER — Ambulatory Visit: Payer: BC Managed Care – PPO | Admitting: Family Medicine

## 2019-04-12 ENCOUNTER — Telehealth (HOSPITAL_COMMUNITY): Payer: Self-pay | Admitting: Licensed Clinical Social Worker

## 2019-04-12 NOTE — Telephone Encounter (Signed)
See phone call 

## 2019-05-06 ENCOUNTER — Telehealth (HOSPITAL_COMMUNITY): Payer: Self-pay | Admitting: Licensed Clinical Social Worker

## 2019-05-06 ENCOUNTER — Other Ambulatory Visit: Payer: Self-pay

## 2019-05-06 ENCOUNTER — Ambulatory Visit (HOSPITAL_COMMUNITY): Payer: BC Managed Care – PPO | Admitting: Licensed Clinical Social Worker

## 2019-05-06 NOTE — Telephone Encounter (Signed)
Patient did not present for her individual webex counseling session. Sent reminder email. Pt did not join. Brooke [name], LCAS

## 2019-05-22 ENCOUNTER — Other Ambulatory Visit: Payer: Self-pay

## 2019-05-22 ENCOUNTER — Ambulatory Visit (INDEPENDENT_AMBULATORY_CARE_PROVIDER_SITE_OTHER): Payer: BC Managed Care – PPO | Admitting: Licensed Clinical Social Worker

## 2019-05-22 ENCOUNTER — Encounter (HOSPITAL_COMMUNITY): Payer: Self-pay | Admitting: Licensed Clinical Social Worker

## 2019-05-22 DIAGNOSIS — F1020 Alcohol dependence, uncomplicated: Secondary | ICD-10-CM

## 2019-05-22 DIAGNOSIS — F331 Major depressive disorder, recurrent, moderate: Secondary | ICD-10-CM | POA: Diagnosis not present
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
## 2019-06-17 ENCOUNTER — Ambulatory Visit (HOSPITAL_COMMUNITY): Payer: BC Managed Care – PPO | Admitting: Licensed Clinical Social Worker

## 2019-06-17 ENCOUNTER — Other Ambulatory Visit: Payer: Self-pay

## 2019-06-26 ENCOUNTER — Other Ambulatory Visit: Payer: Self-pay

## 2019-06-26 ENCOUNTER — Encounter (HOSPITAL_COMMUNITY): Payer: Self-pay | Admitting: Psychiatry

## 2019-06-26 ENCOUNTER — Telehealth (INDEPENDENT_AMBULATORY_CARE_PROVIDER_SITE_OTHER): Payer: BC Managed Care – PPO | Admitting: Psychiatry

## 2019-06-26 DIAGNOSIS — F1010 Alcohol abuse, uncomplicated: Secondary | ICD-10-CM

## 2019-06-26 DIAGNOSIS — F330 Major depressive disorder, recurrent, mild: Secondary | ICD-10-CM | POA: Insufficient documentation

## 2019-06-26 DIAGNOSIS — F411 Generalized anxiety disorder: Secondary | ICD-10-CM | POA: Diagnosis not present

## 2019-06-26 DIAGNOSIS — F331 Major depressive disorder, recurrent, moderate: Secondary | ICD-10-CM

## 2019-06-26 HISTORY — DX: Alcohol abuse, uncomplicated: F10.10

## 2019-06-26 MED ORDER — TRAZODONE HCL 50 MG PO TABS: 50.0000 mg | ORAL_TABLET | Freq: Every evening | ORAL | 0 refills | Status: DC | PRN

## 2019-06-26 MED ORDER — NALTREXONE HCL 50 MG PO TABS: 50.0000 mg | ORAL_TABLET | Freq: Every day | ORAL | 2 refills | Status: DC

## 2019-06-26 MED ORDER — VENLAFAXINE HCL ER 150 MG PO CP24: 150.0000 mg | ORAL_CAPSULE | Freq: Every day | ORAL | 1 refills | Status: DC
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
## 2019-07-27 ENCOUNTER — Other Ambulatory Visit (HOSPITAL_COMMUNITY): Payer: Self-pay | Admitting: Psychiatry

## 2019-08-08 ENCOUNTER — Telehealth (INDEPENDENT_AMBULATORY_CARE_PROVIDER_SITE_OTHER): Payer: BC Managed Care – PPO | Admitting: Psychiatry

## 2019-08-08 ENCOUNTER — Other Ambulatory Visit: Payer: Self-pay

## 2019-08-08 DIAGNOSIS — J209 Acute bronchitis, unspecified: Secondary | ICD-10-CM | POA: Insufficient documentation

## 2019-08-08 DIAGNOSIS — F411 Generalized anxiety disorder: Secondary | ICD-10-CM

## 2019-08-08 DIAGNOSIS — F1010 Alcohol abuse, uncomplicated: Secondary | ICD-10-CM

## 2019-08-08 DIAGNOSIS — F330 Major depressive disorder, recurrent, mild: Secondary | ICD-10-CM

## 2019-08-08 MED ORDER — NALTREXONE HCL 50 MG PO TABS: 50.0000 mg | ORAL_TABLET | Freq: Every day | ORAL | 5 refills | Status: AC

## 2019-08-08 NOTE — Progress Notes (Signed)
[name] OP Progress Note  08/08/2019 8:17 AM Brooke [name]  MRN:  [id number] Interview was conducted using videoconferencing application and I verified that I was speaking with the correct person using two identifiers. I discussed the limitations of evaluation and management by telemedicine and  the availability of in person appointments. Patient expressed understanding and agreed to proceed. Patient location - home; physician - home office.  Chief Complaint: "I feel better".  HPI: 54 yo married AAF who has initially called our offices asking for treatment for alcohol abuse. She had initial assessment with [facility] on 05/22/19 and referred out for therapy (she could not participate in CD-IOP because of work hours). Edell reported that her alcohol intake has increased over past months and she now drinks beer daily (4 on average) but 12 pack on weekends. She admits that she has lost control over how much she drinks. She denies having hx of alcohol withdrawal symptoms (tremor, diaphoresis, nausea, seizures, DTs).  She has a bout of postpartum depression 20 years ago after birth of her daughter. She was not prescribed medications at that time. In 2016 she lost her mother and year later was diagnosed with rt sided breast cancer. These two stressors contributed to her mood decline - she was briefly prescribed a combination of sertraline and clonazepam but does not remember if these were helpful - did not stay on them long. She feels that relationship/marrital issues and work stress (works in [employer] school) are contributing to anxiety and depressed mood. She reports chronic fatigue, insomnia (initial and middle - sleep only few hours), anhedonia, excessive worrying, poor concentration, tearful at times. She is not suicidal and never was in the past. She was never under care of a psychiatrist and was never psychiatrically hospitalized. There is no hx of mania or psychosis. She has been taking  venlafaxine 75 mg for a few years - she believes for headaches. She has never used any sleep aids. WE have increased venlafaxine to 150 mg and added trazodone 50 mg for insomnia. Trazodone is quite helpful but she finds it difficult to "get going" in AM. Mood has improved. She decided to try changing school - had few interviews so far. Alcohol use decline and she finds naltrexone helpful. Individual therapy started.  Visit Diagnosis:    ICD-10-CM   1. Alcohol use disorder, mild, abuse  F10.10   2. GAD (generalized anxiety disorder)  F41.1   3. Major depressive disorder, recurrent episode, mild (HCC)  F33.0     Past Psychiatric History: Please see intake H&P.  Past Medical History:  Past Medical History:  Diagnosis Date  . Anemia   . Anxiety   . Breast cancer of upper-inner quadrant of right female breast ([facility]) 02/18/2015  . Chronic headaches   . Depression   . Fatigue   . Hot flashes   . Yeast infection     Past Surgical History:  Procedure Laterality Date  . BREAST LUMPECTOMY Left 2017  . BREAST LUMPECTOMY WITH RADIOACTIVE SEED LOCALIZATION Left 04/06/2015  . BREAST LUMPECTOMY WITH RADIOACTIVE SEED LOCALIZATION Left 04/06/2015   Procedure: LEFT BREAST LUMPECTOMY WITH RADIOACTIVE SEED LOCALIZATION;  Surgeon: Alphonsa Overall, MD;  Location: [facility];  Service: General;  Laterality: Left;  . BREAST RECONSTRUCTION WITH PLACEMENT OF TISSUE EXPANDER AND FLEX HD (ACELLULAR HYDRATED DERMIS) Right 04/06/2015   Procedure: RIGHT BREAST RECONSTRUCTION WITH PLACEMENT OF TISSUE EXPANDER, POSSIBLE ACELLULAR DERMIS;  Surgeon: Irene Limbo, MD;  Location: [facility];  Service: Plastics;  Laterality: Right;  . BREAST REDUCTION SURGERY Left 08/03/2015   Procedure: LEFT BREAST REDUCTION FOR SYMMETRY  (BREAST);  Surgeon: Irene Limbo, MD;  Location: [facility];  Service: Plastics;  Laterality: Left;  . CHOLECYSTECTOMY    . GASTRIC BYPASS    . LIPOSUCTION WITH LIPOFILLING N/A 08/03/2015    Procedure: LIPOSUCTION WITH LIPOFILLING ;  Surgeon: Irene Limbo, MD;  Location: [facility];  Service: Plastics;  Laterality: N/A;  . MASTECTOMY Right 2017  . MASTECTOMY W/ SENTINEL NODE BIOPSY Right   . MASTECTOMY W/ SENTINEL NODE BIOPSY Right 04/06/2015   Procedure: RIGHT MASTECTOMY WITH SENTINEL LYMPH NODE BIOPSY;  Surgeon: Alphonsa Overall, MD;  Location: [facility];  Service: General;  Laterality: Right;  . REDUCTION MAMMAPLASTY Left 2017  . REMOVAL OF BILATERAL TISSUE EXPANDERS WITH PLACEMENT OF BILATERAL BREAST IMPLANTS Right 08/03/2015   Procedure: REMOVAL OF RIGHT TISSUE EXPANDERS WITH PLACEMENT OF IMPLANT;  Surgeon: Irene Limbo, MD;  Location: [facility];  Service: Plastics;  Laterality: Right;  . TUBAL LIGATION      Family Psychiatric History: Reviewed.  Family History:  Family History  Problem Relation Age of Onset  . Diabetes Mother   . Hypertension Mother   . Spinal muscular atrophy Mother   . Irregular heart beat Mother   . Anxiety disorder Mother   . Arthritis Mother   . Cancer Other        maternal great grandfather dx. unspecified type cancer  . Heart attack Maternal Grandmother   . Heart attack Maternal Uncle   . Cancer Cousin        paternal 1st cousin dx. with cancer that was typically a childhood cancer  . Breast cancer Neg Hx     Social History:  Social History   Socioeconomic History  . Marital status: Married    Spouse name: Not on file  . Number of children: 2  . Years of education: Not on file  . Highest education level: Not on file  Occupational History  . Not on file  Tobacco Use  . Smoking status: Never Smoker  . Smokeless tobacco: Never Used  Substance and Sexual Activity  . Alcohol use: Yes    Alcohol/week: 4.0 - 5.0 standard drinks    Types: 4 - 5 Standard drinks or equivalent per week    Comment: wine and beer on weekends  . Drug use: No  . Sexual activity: Yes    Birth control/protection: Surgical   Other Topics Concern  . Not on file  Social History Narrative  . Not on file   Social Determinants of Health   Financial Resource Strain:   . Difficulty of Paying Living Expenses:   Food Insecurity:   . Worried About Charity fundraiser in the Last Year:   . Arboriculturist in the Last Year:   Transportation Needs:   . Film/video editor (Medical):   Marland Kitchen Lack of Transportation (Non-Medical):   Physical Activity:   . Days of Exercise per Week:   . Minutes of Exercise per Session:   Stress:   . Feeling of Stress :   Social Connections:   . Frequency of Communication with Friends and Family:   . Frequency of Social Gatherings with Friends and Family:   . Attends Religious Services:   . Active Member of Clubs or Organizations:   . Attends Archivist Meetings:   Marland Kitchen Marital Status:     Allergies: No Known  Allergies  Metabolic Disorder Labs: Lab Results  Component Value Date   HGBA1C 6.2 (H) 02/02/2012   MPG 131 (H) 02/02/2012   No results found for: PROLACTIN No results found for: CHOL, TRIG, HDL, CHOLHDL, VLDL, LDLCALC Lab Results  Component Value Date   TSH 0.600 03/14/2014   TSH 0.821 02/02/2012    Therapeutic Level Labs: No results found for: LITHIUM No results found for: VALPROATE No components found for:  CBMZ  Current Medications: Current Outpatient Medications  Medication Sig Dispense Refill  . gabapentin (NEURONTIN) 300 MG capsule Take 1 capsule (300 mg total) by mouth at bedtime. 90 capsule 4  . meloxicam (MOBIC) 7.5 MG tablet Take 1 tablet (7.5 mg total) by mouth daily. With food 30 tablet 3  . naltrexone (DEPADE) 50 MG tablet Take 1 tablet (50 mg total) by mouth daily. 30 tablet 5  . tamoxifen (NOLVADEX) 20 MG tablet Take 1 tablet (20 mg total) by mouth daily. 90 tablet 4  . tizanidine (ZANAFLEX) 2 MG capsule Take 1 capsule (2 mg total) by mouth 3 (three) times daily. 30 capsule 0  . traZODone (DESYREL) 50 MG tablet TAKE 1 TABLET BY MOUTH  AT BEDTIME AS NEEDED FOR SLEEP 90 tablet 1  . valACYclovir (VALTREX) 500 MG tablet TAKE 1 TABLET BY MOUTH EVERY DAY 90 tablet 4  . venlafaxine XR (EFFEXOR-XR) 150 MG 24 hr capsule TAKE 1 CAPSULE (150 MG TOTAL) BY MOUTH DAILY WITH BREAKFAST. 90 capsule 1   No current facility-administered medications for this visit.      Psychiatric Specialty Exam: Review of Systems  Constitutional: Positive for fatigue.  Psychiatric/Behavioral: Positive for sleep disturbance. The patient is nervous/anxious.   All other systems reviewed and are negative.   Last menstrual period 04/05/2016.There is no height or weight on file to calculate BMI.  General Appearance: Casual and Well Groomed  Eye Contact:  Good  Speech:  Clear and Coherent  Volume:  Normal  Mood:  "Better".  Affect:  Full Range  Thought Process:  Goal Directed  Orientation:  Full (Time, Place, and Person)  Thought Content: Logical   Suicidal Thoughts:  No  Homicidal Thoughts:  No  Memory:  Immediate;   Good Recent;   Good Remote;   Good  Judgement:  Good  Insight:  Fair  Psychomotor Activity:  Normal  Concentration:  Concentration: Good  Recall:  Good  Fund of Knowledge: Good  Language: Good  Akathisia:  Negative  Handed:  Right  AIMS (if indicated): not done  Assets:  Communication Skills Desire for Improvement Financial Resources/Insurance Housing Resilience Talents/Skills Vocational/Educational  ADL's:  Intact  Cognition: WNL  Sleep:  Fair   Screenings: PHQ2-9     Office Visit from 10/22/2018 in Primary Care at [facility] from 01/26/2017 in Primary Care at [facility] from 09/15/2016 in Primary Care at [facility] from 08/31/2016 in Primary Care at [facility]  PHQ-2 Total Score 0 0 0 0       Assessment and Plan: 54 yo married AAF who has initially called our offices asking for treatment for alcohol abuse. She had initial assessment with [facility] on 05/22/19 and referred out for  therapy (she could not participate in CD-IOP because of work hours). Brooke [name] reported that her alcohol intake has increased over past months and she now drinks beer daily (4 on average) but 12 pack on weekends. She admits that she has lost control over how much she drinks. She denies having  hx of alcohol withdrawal symptoms (tremor, diaphoresis, nausea, seizures, DTs).  She has a bout of postpartum depression 20 years ago after birth of her daughter. She was not prescribed medications at that time. In 2016 she lost her mother and year later was diagnosed with rt sided breast cancer. These two stressors contributed to her mood decline - she was briefly prescribed a combination of sertraline and clonazepam but does not remember if these were helpful - did not stay on them long. She feels that relationship/marrital issues and work stress (works in [employer] school) are contributing to anxiety and depressed mood. She reports chronic fatigue, insomnia (initial and middle - sleep only few hours), anhedonia, excessive worrying, poor concentration, tearful at times. She is not suicidal and never was in the past. She was never under care of a psychiatrist and was never psychiatrically hospitalized. There is no hx of mania or psychosis. She has been taking venlafaxine 75 mg for a few years - she believes for headaches. She has never used any sleep aids. WE have increased venlafaxine to 150 mg and added trazodone 50 mg for insomnia. Trazodone is quite helpful but she finds it difficult to "get going" in AM. Mood has improved. She decided to try changing school - had few interviews so far. Alcohol use decline and she finds naltrexone helpful. Individual therapy started.  Dx: Alcohol use disorder; MDD recurrent mild; GAD  Plan: We will continue venlafaxine XR 150 mg, decrease trazodone to 25 mg insomnia and naltrexone 50 mg for alcohol craving. She is on tamoxifen which may be contributing to chronic fatigue. Next  appointment in 2 months. The plan was discussed with patient who had an opportunity to ask questions and these were all answered. I spend 15 minutes in videoconferencing with the patient.     Stephanie Acre, MD 08/08/2019, 8:17 AM

## 2019-10-10 ENCOUNTER — Other Ambulatory Visit: Payer: Self-pay | Admitting: Physician Assistant

## 2019-10-10 DIAGNOSIS — Z1231 Encounter for screening mammogram for malignant neoplasm of breast: Secondary | ICD-10-CM

## 2019-10-17 ENCOUNTER — Telehealth (HOSPITAL_COMMUNITY): Payer: BC Managed Care – PPO | Admitting: Psychiatry

## 2019-11-05 ENCOUNTER — Ambulatory Visit
Admission: RE | Admit: 2019-11-05 | Discharge: 2019-11-05 | Disposition: A | Discharge status: Home / Self Care | Payer: BC Managed Care – PPO | Source: Ambulatory Visit | Attending: Physician Assistant | Admitting: Physician Assistant

## 2019-11-05 ENCOUNTER — Other Ambulatory Visit: Payer: Self-pay

## 2019-11-05 DIAGNOSIS — Z1231 Encounter for screening mammogram for malignant neoplasm of breast: Secondary | ICD-10-CM

## 2019-11-05 HISTORY — DX: Malignant neoplasm of unspecified site of unspecified female breast: C50.919

## 2019-11-14 ENCOUNTER — Other Ambulatory Visit: Payer: Self-pay | Admitting: Oncology

## 2019-11-14 ENCOUNTER — Telehealth: Payer: Self-pay | Admitting: Oncology

## 2019-11-14 NOTE — Telephone Encounter (Signed)
Scheduled appt per 11/11 sch msg - pt is aware of appt date and time   

## 2019-11-22 ENCOUNTER — Inpatient Hospital Stay: Payer: BC Managed Care – PPO | Attending: Oncology | Admitting: Oncology

## 2019-11-22 ENCOUNTER — Other Ambulatory Visit: Payer: Self-pay | Admitting: Oncology

## 2019-11-22 ENCOUNTER — Inpatient Hospital Stay: Payer: BC Managed Care – PPO

## 2019-11-22 ENCOUNTER — Other Ambulatory Visit: Payer: Self-pay

## 2019-11-22 VITALS — BP 147/77 | HR 82 | Temp 98.0°F | Resp 18 | Ht 71.0 in | Wt 224.7 lb

## 2019-11-22 DIAGNOSIS — Z9011 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: Secondary | ICD-10-CM | POA: Diagnosis not present

## 2019-11-22 DIAGNOSIS — Z170 Estrogen receptor positive status [ER+]: Secondary | ICD-10-CM

## 2019-11-22 DIAGNOSIS — F32A Depression, unspecified: Secondary | ICD-10-CM | POA: Insufficient documentation

## 2019-11-22 DIAGNOSIS — Z79899 Other long term (current) drug therapy: Secondary | ICD-10-CM | POA: Insufficient documentation

## 2019-11-22 DIAGNOSIS — Z79810 Long term (current) use of selective estrogen receptor modulators (SERMs): Secondary | ICD-10-CM | POA: Insufficient documentation

## 2019-11-22 DIAGNOSIS — Z9884 Bariatric surgery status: Secondary | ICD-10-CM | POA: Insufficient documentation

## 2019-11-22 DIAGNOSIS — F419 Anxiety disorder, unspecified: Secondary | ICD-10-CM | POA: Insufficient documentation

## 2019-11-22 DIAGNOSIS — C50211 Malignant neoplasm of upper-inner quadrant of right female breast: Secondary | ICD-10-CM | POA: Diagnosis present

## 2019-11-22 LAB — CBC WITH DIFFERENTIAL/PLATELET
Abs Immature Granulocytes: 0.02 10*3/uL (ref 0.00–0.07)
Basophils Absolute: 0.1 10*3/uL (ref 0.0–0.1)
Basophils Relative: 1 %
Eosinophils Absolute: 0.1 10*3/uL (ref 0.0–0.5)
Eosinophils Relative: 2 %
HCT: 34.4 % — ABNORMAL LOW (ref 36.0–46.0)
Hemoglobin: 11.2 g/dL — ABNORMAL LOW (ref 12.0–15.0)
Immature Granulocytes: 0 %
Lymphocytes Relative: 47 %
Lymphs Abs: 3 10*3/uL (ref 0.7–4.0)
MCH: 30.9 pg (ref 26.0–34.0)
MCHC: 32.6 g/dL (ref 30.0–36.0)
MCV: 95 fL (ref 80.0–100.0)
Monocytes Absolute: 0.6 10*3/uL (ref 0.1–1.0)
Monocytes Relative: 9 %
Neutro Abs: 2.7 10*3/uL (ref 1.7–7.7)
Neutrophils Relative %: 41 %
Platelets: 253 10*3/uL (ref 150–400)
RBC: 3.62 MIL/uL — ABNORMAL LOW (ref 3.87–5.11)
RDW: 15.2 % (ref 11.5–15.5)
WBC: 6.5 10*3/uL (ref 4.0–10.5)
nRBC: 0 % (ref 0.0–0.2)

## 2019-11-22 LAB — SAVE SMEAR(SSMR), FOR PROVIDER SLIDE REVIEW

## 2019-11-22 LAB — COMPREHENSIVE METABOLIC PANEL
ALT: 50 U/L — ABNORMAL HIGH (ref 0–44)
AST: 56 U/L — ABNORMAL HIGH (ref 15–41)
Albumin: 3.9 g/dL (ref 3.5–5.0)
Alkaline Phosphatase: 68 U/L (ref 38–126)
Anion gap: 10 (ref 5–15)
BUN: 18 mg/dL (ref 6–20)
CO2: 26 mmol/L (ref 22–32)
Calcium: 8.8 mg/dL — ABNORMAL LOW (ref 8.9–10.3)
Chloride: 101 mmol/L (ref 98–111)
Creatinine, Ser: 0.95 mg/dL (ref 0.44–1.00)
GFR, Estimated: >60 mL/min (ref >60)
Glucose, Bld: 102 mg/dL — ABNORMAL HIGH (ref 70–99)
Potassium: 3.4 mmol/L — ABNORMAL LOW (ref 3.5–5.1)
Sodium: 137 mmol/L (ref 135–145)
Total Bilirubin: 0.7 mg/dL (ref 0.3–1.2)
Total Protein: 7.2 g/dL (ref 6.5–8.1)

## 2019-11-22 LAB — DIC (DISSEMINATED INTRAVASCULAR COAGULATION)PANEL
D-Dimer, Quant: 0.35 ug/mL-FEU (ref 0.00–0.50)
Fibrinogen: 273 mg/dL (ref 210–475)
INR: 1 (ref 0.8–1.2)
Platelets: 282 10*3/uL (ref 150–400)
Prothrombin Time: 12.5 seconds (ref 11.4–15.2)
Smear Review: NONE SEEN
aPTT: 21 seconds — ABNORMAL LOW (ref 24–36)

## 2019-11-22 MED ORDER — VENLAFAXINE HCL ER 150 MG PO CP24: 150.0000 mg | ORAL_CAPSULE | Freq: Every day | ORAL | 4 refills | Status: DC

## 2019-11-22 NOTE — Progress Notes (Signed)
Brooke [name]  [phone] Fax:([phone]) [phone]     ID: Brooke [name] DOB: Oct 09, 1965  MR#: [id number]  ZTI#:[id number]  Patient Care Team: [name], NP as PCP - General Abdur Hoglund, Virgie Dad, MD as Consulting Physician (Oncology) Thea Silversmith, MD as Consulting Physician (Radiation Oncology) Alphonsa Overall, MD as Consulting Physician (General Surgery) Sylvan Cheese, NP as Nurse Practitioner (Hematology and Oncology) Irene Limbo, MD as Consulting Physician (Plastic Surgery) OTHER MD: [facility]   CHIEF COMPLAINT: Estrogen receptor positive breast cancer  CURRENT TREATMENT: Completing 5 years of tamoxifen   INTERVAL HISTORY: Brooke [name] was seen today for follow-up of her estrogen receptor positive breast cancer.   She continues on tamoxifen.  She has moderate hot flashes.  She does not have vaginal discharge.  Instead she has some vaginal dryness issues.  She has had no other complications from this.  Since her last visit, she underwent left screening mammography with tomography at [facility] on 11/05/2019 showing: breast density category B; no evidence of malignancy.    REVIEW OF SYSTEMS: Mackie tells me she has been having bruising for a month.  It is both on the inside and outside of her legs and arms.  She is not aware of any trauma.  She is taking meloxicam, which potentially could cause this.  She is not on any other blood thinner.  She tells me she lost about 50 pounds thanks to strenuous exercise but now is not exercising regularly and has gained about 8 pounds back.  Aside from this detailed review of systems today was stable   COVID 19 VACCINATION STATUS: Status post [place] x2, no booster as of 11/23/2019  Wt Readings from Last 3 Encounters:  11/22/19 224 lb 11.2 oz (101.9 kg)  10/22/18 219 lb (99.3 kg)  06/18/18 215 lb 6.4 oz (97.7 kg)    BREAST CANCER HISTORY: From the original intake note:  [name] had  routine gynecologic follow-up with Dr. Caralee Ates office and as part of that exam she had screening bilateral mammography which showed a possible mass and calcifications in the right breast. She was referred to the [facility] where on 02/12/2015 she had a right diagnostic mammography with tomo synthesis and right breast ultrasonography. In the upper inner quadrant of the right breast there was a spiculated mass measuring approximately 1.5 cm. Posterior to this mass, approximately 5.5 cm away, there was a subtle area of pleomorphic calcifications spanning 1 cm. By physical exam the upper inner quadrant right breast mass was palpable 2-3 cm from the nipple. By ultrasound this proved to be irregular and heterogeneous and measuring approximately 2.2 cm. The right axilla was benign.  On 02/16/2015 the patient underwent biopsy of the right breast mass in question and also a more posterior area of calcifications, not seen on the original mammogram, 10 cm away from the biopsied mass. 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 Biopsy of the more posterior area of calcifications also showed invasive as well as in situ ductal carcinoma, grade 1 or 2, estrogen receptor 95% positive, with strong staining intensity, progesterone receptor 45% positive, with moderate staining intensity, with an MIB-1 of 25%, and HER-2 also negative, with a signals ratio of 1.48 and the number per cell being 2.00.  The patient's  subsequent history is as detailed below.   PAST MEDICAL HISTORY: Past Medical History:  Diagnosis Date  . Anemia   . Anxiety   . Breast cancer ([place])   . Breast cancer of upper-inner quadrant of right female breast ([place]) 02/18/2015  . Chronic headaches   . Depression   .  Fatigue   . Hot flashes   . Yeast infection     PAST SURGICAL HISTORY: Past Surgical History:  Procedure Laterality Date  . BREAST LUMPECTOMY Left 2017  . BREAST LUMPECTOMY WITH RADIOACTIVE SEED LOCALIZATION Left 04/06/2015  . BREAST LUMPECTOMY WITH RADIOACTIVE SEED LOCALIZATION Left 04/06/2015   Procedure: LEFT BREAST LUMPECTOMY WITH RADIOACTIVE SEED LOCALIZATION;  Surgeon: Alphonsa Overall, MD;  Location: [facility];  Service: General;  Laterality: Left;  . BREAST RECONSTRUCTION WITH PLACEMENT OF TISSUE EXPANDER AND FLEX HD (ACELLULAR HYDRATED DERMIS) Right 04/06/2015   Procedure: RIGHT BREAST RECONSTRUCTION WITH PLACEMENT OF TISSUE EXPANDER, POSSIBLE ACELLULAR DERMIS;  Surgeon: Irene Limbo, MD;  Location: [facility];  Service: Plastics;  Laterality: Right;  . BREAST REDUCTION SURGERY Left 08/03/2015   Procedure: LEFT BREAST REDUCTION FOR SYMMETRY  (BREAST);  Surgeon: Irene Limbo, MD;  Location: [facility];  Service: Plastics;  Laterality: Left;  . CHOLECYSTECTOMY    . GASTRIC BYPASS    . LIPOSUCTION WITH LIPOFILLING N/A 08/03/2015   Procedure: LIPOSUCTION WITH LIPOFILLING ;  Surgeon: Irene Limbo, MD;  Location: [facility];  Service: Plastics;  Laterality: N/A;  . MASTECTOMY Right 2017  . MASTECTOMY W/ SENTINEL NODE BIOPSY Right   . MASTECTOMY W/ SENTINEL NODE BIOPSY Right 04/06/2015   Procedure: RIGHT MASTECTOMY WITH SENTINEL LYMPH NODE BIOPSY;  Surgeon: Alphonsa Overall, MD;  Location: [facility];  Service: General;  Laterality: Right;  . REDUCTION MAMMAPLASTY Left 2017  . REMOVAL OF BILATERAL TISSUE EXPANDERS WITH PLACEMENT OF BILATERAL BREAST IMPLANTS Right 08/03/2015   Procedure: REMOVAL OF RIGHT TISSUE EXPANDERS WITH PLACEMENT OF IMPLANT;  Surgeon: Irene Limbo, MD;  Location: [facility];  Service: Plastics;  Laterality: Right;  . TUBAL LIGATION      FAMILY HISTORY Family History  Problem Relation Age of Onset  . Diabetes Mother   .  Hypertension Mother   . Spinal muscular atrophy Mother   . Irregular heart beat Mother   . Anxiety disorder Mother   . Arthritis Mother   . Cancer Other        maternal great grandfather dx. unspecified type cancer  . Heart attack Maternal Grandmother   . Heart attack Maternal Uncle   . Cancer Cousin        paternal 1st cousin dx. with cancer that was typically a childhood cancer  . Breast cancer Neg Hx    the patient has little information on her father's side of the family. Her mother died of complications of nonalcoholic cirrhosis of the age of 31. The patient had 4 brothers, 2 sisters. A maternal great-grandfather reportedly had cancer of some type, but she has no further information.   GYNECOLOGIC HISTORY:  Patient's last menstrual period was 04/05/2016 (approximate).  menarche age 23, first live birth age 43, the patient is [place] P2. She has periods fairly irregularly at present and they last about 3 days. She used Depo Provera last in 2000.    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 Her husband [name] is a Customer service manager. Son Phillips Odor (pronounced "X--avier") is a Environmental consultant at [employer] in [place]. Daughter [name], is now attending [employer]. There are 1 grandchild.  The patient attends the [employer] in [place]: In the absence of any documentation to the contrary, the patient's spouse is their HCPOA.    HEALTH MAINTENANCE: Social History   Tobacco Use  . Smoking status: Never Smoker  . Smokeless tobacco: Never Used  Substance Use Topics  . Alcohol use: Yes    Alcohol/week: 4.0 - 5.0 standard drinks    Types: 4 - 5 Standard drinks or equivalent per week    Comment: wine and beer on weekends  . Drug use: No     Colonoscopy: never   PAP: 07/28/2017, negative  Bone density: never   Lipid panel:  No Known Allergies  Current Outpatient  Medications  Medication Sig Dispense Refill  . gabapentin (NEURONTIN) 300 MG capsule Take 1 capsule (300 mg total) by mouth at bedtime. 90 capsule 4  . meloxicam (MOBIC) 7.5 MG tablet Take 1 tablet (7.5 mg total) by mouth daily. With food 30 tablet 3  . naltrexone (DEPADE) 50 MG tablet Take 1 tablet (50 mg total) by mouth daily. 30 tablet 5  . tamoxifen (NOLVADEX) 20 MG tablet Take 1 tablet (20 mg total) by mouth daily. 90 tablet 4  . tizanidine (ZANAFLEX) 2 MG capsule Take 1 capsule (2 mg total) by mouth 3 (three) times daily. 30 capsule 0  . traZODone (DESYREL) 50 MG tablet TAKE 1 TABLET BY MOUTH AT BEDTIME AS NEEDED FOR SLEEP 90 tablet 1  . valACYclovir (VALTREX) 500 MG tablet TAKE 1 TABLET BY MOUTH EVERY DAY 90 tablet 4  . venlafaxine XR (EFFEXOR-XR) 150 MG 24 hr capsule TAKE 1 CAPSULE (150 MG TOTAL) BY MOUTH DAILY WITH BREAKFAST. 90 capsule 1   No current facility-administered medications for this visit.    OBJECTIVE: African-American woman who appears tired Vitals:   11/22/19 1520  BP: (!) 147/77  Pulse: 82  Resp: 18  Temp: 98 F (36.7 C)     Body mass index is 31.34 kg/m.    ECOG FS:0 - Asymptomatic  Sclerae unicteric, EOMs intact Wearing a mask No cervical or supraclavicular adenopathy Lungs no rales or rhonchi Heart regular rate and rhythm Abd soft, nontender, positive bowel sounds MSK no focal spinal tenderness, no upper extremity lymphedema Neuro: nonfocal, well oriented, appropriate affect Breasts: The right breast is status post mastectomy and reconstruction with no evidence of disease recurrence.  Left breast is status post reduction mammoplasty.  Both axillae are benign.   LAB RESULTS:  CMP     Component Value Date/Time   NA 140 05/23/2018 0912   NA 141 06/22/2016 0858   K 4.0 05/23/2018 0912   K 4.4 06/22/2016 0858   CL 106 05/23/2018 0912   CO2 25 05/23/2018 0912   CO2 22 06/22/2016 0858   GLUCOSE 103 (H) 05/23/2018 0912   GLUCOSE 110 06/22/2016 0858     BUN 13 05/23/2018 0912   BUN 11.8 06/22/2016 0858   CREATININE 0.84 05/23/2018 0912   CREATININE 0.8 06/22/2016 0858   CALCIUM 9.1 05/23/2018 0912   CALCIUM 9.1 06/22/2016 0858   PROT 7.2 05/23/2018 0912   PROT 7.1 06/22/2016 0858   ALBUMIN 4.0 05/23/2018 0912   ALBUMIN 3.5 06/22/2016 0858   AST 26 05/23/2018 0912   AST 48 (H) 06/22/2016 0858   ALT 23 05/23/2018 0912   ALT 35 06/22/2016 0858   ALKPHOS 73 05/23/2018  0912   ALKPHOS 109 06/22/2016 0858   BILITOT 0.4 05/23/2018 0912   BILITOT 0.78 06/22/2016 0858   GFRNONAA >60 05/23/2018 0912   GFRNONAA >89 03/14/2014 1354   GFRAA >60 05/23/2018 0912   GFRAA >89 03/14/2014 1354    INo results found for: SPEP, UPEP  Lab Results  Component Value Date   WBC 4.9 05/23/2018   NEUTROABS 2.4 05/23/2018   HGB 11.8 (L) 05/23/2018   HCT 36.6 05/23/2018   MCV 97.1 05/23/2018   PLT 335 05/23/2018      Chemistry      Component Value Date/Time   NA 140 05/23/2018 0912   NA 141 06/22/2016 0858   K 4.0 05/23/2018 0912   K 4.4 06/22/2016 0858   CL 106 05/23/2018 0912   CO2 25 05/23/2018 0912   CO2 22 06/22/2016 0858   BUN 13 05/23/2018 0912   BUN 11.8 06/22/2016 0858   CREATININE 0.84 05/23/2018 0912   CREATININE 0.8 06/22/2016 0858      Component Value Date/Time   CALCIUM 9.1 05/23/2018 0912   CALCIUM 9.1 06/22/2016 0858   ALKPHOS 73 05/23/2018 0912   ALKPHOS 109 06/22/2016 0858   AST 26 05/23/2018 0912   AST 48 (H) 06/22/2016 0858   ALT 23 05/23/2018 0912   ALT 35 06/22/2016 0858   BILITOT 0.4 05/23/2018 0912   BILITOT 0.78 06/22/2016 0858       No results found for: LABCA2  No components found for: LABCA125  No results for input(s): INR in the last 168 hours.  Urinalysis    Component Value Date/Time   BILIRUBINUR neg 05/03/2012 0954   PROTEINUR neg 05/03/2012 0954   UROBILINOGEN 0.2 05/03/2012 0954   NITRITE positive 05/03/2012 0954   LEUKOCYTESUR Trace 05/03/2012 0954    ELIGIBLE FOR AVAILABLE  RESEARCH PROTOCOL: no  STUDIES: MM 3D SCREEN BREAST UNI LEFT  Result Date: 11/08/2019 CLINICAL DATA:  Screening. EXAM: DIGITAL SCREENING UNILATERAL LEFT MAMMOGRAM WITH CAD AND TOMO COMPARISON:  Previous exam(s). ACR Breast Density Category b: There are scattered areas of fibroglandular density. FINDINGS: The patient has had a right mastectomy. There are no findings suspicious for malignancy. Images were processed with CAD. IMPRESSION: No mammographic evidence of malignancy. A result letter of this screening mammogram will be mailed directly to the patient. RECOMMENDATION: Screening mammogram in one year.  (Code:[id number]-L-52M) BI-RADS CATEGORY  1: Negative. Electronically Signed   By: Lillia Mountain M.D.   On: 11/08/2019 13:08    ASSESSMENT: 54 y.o. [place] woman s/p biopsy 02/17/2015 of 2 places in her right breast upper inner quadrant, both showing invasive ductal carcinoma, clinically T2 NX, stage II, estrogen receptor and progesterone receptor strongly positive, HER-2/neu nonamplified, with an MIB-1 of 25%  (1) genetics testing 03/06/2015 through theBreast/Ovarian Cancer Panel offered [facility] Laboratoriesfound no deleterious mutations in ATM, BARD1, BRCA1, BRCA2, BRIP1, CDH1, CHEK2, FANCC, MLH1, MSH2, MSH6, NBN, PALB2, PMS2, PTEN, RAD51C, RAD51D, TP53, and XRCC2. This panel also includes deletion/duplication analysis (without sequencing) for one gene, EPCAM.   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    (2) Oncotype DX score of 5 predicts an outside the breast risk of recurrence within 10 years of 5% if the patient's only systemic therapy is tamoxifen for 5 years. It also predicts no benefit from chemotherapy  (3) left breast biopsy 03/10/2015 shows an area of atypical ductal hyperplasia  (a) left lumpectomy for 03/23/2015 showed only  usual ductal hyperplasia, no atypia or tumor  (4) right mastectomy with sentinel  lymph node sampling 04/06/2015 shows an mpT1C pN0, stage IA invasive ductal carcinoma, grade 2 with negative margins, repeat HER-2 (2) again negative  (a) status post right saline implant placement and left reduction mammoplasty 08/03/2015 with benign pathology  (5) adjuvant radiation not indicated  (6) tamoxifen started neoadjuvantly on 02/25/2015, completing 5 years December 2021   PLAN: Brooke [name] is now 4-1/2 years out from definitive surgery for her breast cancer with no evidence of disease recurrence.  This is very favorable.  She will complete 5 years of tamoxifen within the next 2 months.  Simply she will continue to take the medication until she runs out.  We had also started her on venlafaxine.  I will be glad to refill that for her at this point.  We had originally prescribed this for hot flashes but she has had quite a bit of anxiety and some panic attacks and this seems to be helping with that.  If she ever does decide to stop the venlafaxine she understands she must taper it slowly to off over a period of several weeks.  We will be glad to help her accomplish that or she can do it through her primary care physician.  At this point I am comfortable with her "graduating" from a breast cancer follow-up.  However she is interested in continuing yearly visits here through our survivorship program and I have arranged for that.  She will return in 1 year with labs same year.  I do not have an explanation for the bruising.  I am obtaining lab work today including a blood film for review.  Total encounter time 35 minutes.*  Toribio Seiber, Virgie Dad, MD  11/22/19 3:24 PM Medical Oncology and Hematology [facility] [place], [place] [address] Tel. [phone]    Fax. [phone]    I, Wilburn Mylar, am acting as scribe for Dr. Virgie Dad. Lucylle Foulkes.  I, Lurline Del MD, have reviewed the above documentation for accuracy and completeness, and I agree with the  above.    *Total Encounter Time as defined by the Centers for Medicare and Medicaid Services includes, in addition to the face-to-face time of a patient visit (documented in the note above) non-face-to-face time: obtaining and reviewing outside history, ordering and reviewing medications, tests or procedures, care coordination (communications with other health care professionals or caregivers) and documentation in the medical record.

## 2019-11-25 ENCOUNTER — Telehealth: Payer: Self-pay

## 2019-11-25 ENCOUNTER — Other Ambulatory Visit: Payer: Self-pay | Admitting: Oncology

## 2019-11-25 NOTE — Telephone Encounter (Signed)
Per Dr Jana Hakim, I called pt and LVM for her to return call regarding her labs d/t unexplained bruising. Dr Jana Hakim wants to know that pt's living conditions are safe, and that most importantly the pt feels safe as her labs do not reflect reason for unexplained bruising. Dr Jana Hakim advises pt to stop Mobic and let us know in one month if bruising has improved.

## 2019-11-26 ENCOUNTER — Telehealth: Payer: Self-pay

## 2019-11-26 NOTE — Telephone Encounter (Signed)
Pt called back and LVM to receive results. This LPN returned call, no ans. LVM for pt to return call.

## 2019-11-27 ENCOUNTER — Ambulatory Visit: Payer: BC Managed Care – PPO | Admitting: Nurse Practitioner

## 2019-11-29 ENCOUNTER — Telehealth: Payer: Self-pay | Admitting: *Deleted

## 2019-11-29 NOTE — Telephone Encounter (Signed)
This RN spoke with pt per her return call about labs.  Reviewed per MD note of no labs decisive on why she has increased bruising.  Jamekia verified she has no safety issues in her home or environment that would be related to bruising.  Discussed MD recommendation to hold Mobic presently as it has a mild blood thinning potential.  Per conversation Rhodia stated concern for additional issues she is having-  Extreme fatigue and malaise " almost like when I had Covid "  Note she had Covid in August of 2021 with moderate to severe symptoms but did not require hospitalization.  She states continued anorexia as well since Covid- she states she can taste " but just don't care to eat "  She is having significant sleep wake disturbances.  She has tried OTC " natural remedies " but not melatonin.  This RN discussed above with recommendation for her to start melatonin at 3 mg nightly- avoid late day caffeine as well as large fluid intake before bed ( so she is awoken due to full bladder ).  Also discussed possible symptoms secondary to Covid- and inquired with Maryagnes if she feels she would benefit from evaluation at the [facility]- which she is agreeable to.  This note will be forwarded to provider for review as well as this RN contacted the [place] [facility] at [facility] and left request for return call for appointment/referral for patient.

## 2019-12-02 ENCOUNTER — Telehealth: Payer: Self-pay | Admitting: *Deleted

## 2019-12-02 ENCOUNTER — Telehealth: Payer: Self-pay | Admitting: Nurse Practitioner

## 2019-12-02 NOTE — Telephone Encounter (Signed)
We will call to schedule appointment.

## 2019-12-02 NOTE — Telephone Encounter (Signed)
Pt was called per request from pcp office to make appt w/ [facility]. lvm to return call

## 2020-01-27 ENCOUNTER — Other Ambulatory Visit (HOSPITAL_COMMUNITY): Payer: Self-pay | Admitting: Psychiatry

## 2020-01-28 DIAGNOSIS — S9031XA Contusion of right foot, initial encounter: Secondary | ICD-10-CM | POA: Insufficient documentation

## 2020-11-23 ENCOUNTER — Inpatient Hospital Stay: Admitting: Adult Health

## 2020-11-23 ENCOUNTER — Inpatient Hospital Stay

## 2020-11-24 ENCOUNTER — Other Ambulatory Visit: Payer: Self-pay

## 2020-11-24 ENCOUNTER — Encounter: Payer: Self-pay | Admitting: Adult Health

## 2020-11-24 ENCOUNTER — Inpatient Hospital Stay: Attending: Adult Health

## 2020-11-24 ENCOUNTER — Inpatient Hospital Stay (HOSPITAL_BASED_OUTPATIENT_CLINIC_OR_DEPARTMENT_OTHER): Admitting: Adult Health

## 2020-11-24 VITALS — BP 133/75 | HR 66 | Temp 97.4°F | Resp 18 | Ht 71.0 in | Wt 236.9 lb

## 2020-11-24 DIAGNOSIS — Z170 Estrogen receptor positive status [ER+]: Secondary | ICD-10-CM

## 2020-11-24 DIAGNOSIS — C50211 Malignant neoplasm of upper-inner quadrant of right female breast: Secondary | ICD-10-CM | POA: Diagnosis not present

## 2020-11-24 DIAGNOSIS — Z853 Personal history of malignant neoplasm of breast: Secondary | ICD-10-CM | POA: Diagnosis present

## 2020-11-24 DIAGNOSIS — R233 Spontaneous ecchymoses: Secondary | ICD-10-CM | POA: Diagnosis not present

## 2020-11-24 DIAGNOSIS — Z9011 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: Secondary | ICD-10-CM | POA: Insufficient documentation

## 2020-11-24 LAB — COMPREHENSIVE METABOLIC PANEL
ALT: 18 U/L (ref 0–44)
AST: 22 U/L (ref 15–41)
Albumin: 3.8 g/dL (ref 3.5–5.0)
Alkaline Phosphatase: 84 U/L (ref 38–126)
Anion gap: 9 (ref 5–15)
BUN: 12 mg/dL (ref 6–20)
CO2: 27 mmol/L (ref 22–32)
Calcium: 8.9 mg/dL (ref 8.9–10.3)
Chloride: 105 mmol/L (ref 98–111)
Creatinine, Ser: 0.82 mg/dL (ref 0.44–1.00)
GFR, Estimated: >60 mL/min (ref >60)
Glucose, Bld: 69 mg/dL — ABNORMAL LOW (ref 70–99)
Potassium: 3.7 mmol/L (ref 3.5–5.1)
Sodium: 141 mmol/L (ref 135–145)
Total Bilirubin: 0.6 mg/dL (ref 0.3–1.2)
Total Protein: 7 g/dL (ref 6.5–8.1)

## 2020-11-24 LAB — CBC WITH DIFFERENTIAL/PLATELET
Abs Immature Granulocytes: 0.01 10*3/uL (ref 0.00–0.07)
Basophils Absolute: 0.1 10*3/uL (ref 0.0–0.1)
Basophils Relative: 2 %
Eosinophils Absolute: 0.3 10*3/uL (ref 0.0–0.5)
Eosinophils Relative: 5 %
HCT: 37.2 % (ref 36.0–46.0)
Hemoglobin: 11.8 g/dL — ABNORMAL LOW (ref 12.0–15.0)
Immature Granulocytes: 0 %
Lymphocytes Relative: 36 %
Lymphs Abs: 1.8 10*3/uL (ref 0.7–4.0)
MCH: 30.6 pg (ref 26.0–34.0)
MCHC: 31.7 g/dL (ref 30.0–36.0)
MCV: 96.6 fL (ref 80.0–100.0)
Monocytes Absolute: 0.5 10*3/uL (ref 0.1–1.0)
Monocytes Relative: 9 %
Neutro Abs: 2.4 10*3/uL (ref 1.7–7.7)
Neutrophils Relative %: 48 %
Platelets: 327 10*3/uL (ref 150–400)
RBC: 3.85 MIL/uL — ABNORMAL LOW (ref 3.87–5.11)
RDW: 14.1 % (ref 11.5–15.5)
WBC: 5 10*3/uL (ref 4.0–10.5)
nRBC: 0 % (ref 0.0–0.2)

## 2020-11-24 NOTE — Progress Notes (Signed)
CLINIC:  Survivorship   REASON FOR VISIT:  Routine follow-up for history of breast cancer.   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  This panel also includes deletion/duplication analysis (without sequencing) for one gene, EPCAM.               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      (2) Oncotype DX score of 5 predicts an outside the breast risk of recurrence within 10 years of 5% if the patient's only systemic therapy is tamoxifen for 5 years. It also predicts no benefit from chemotherapy   (3) left breast biopsy 03/10/2015 shows an area of atypical ductal hyperplasia             (a) left lumpectomy for 03/23/2015 showed only usual ductal hyperplasia, no atypia or tumor   (4) right mastectomy with sentinel lymph node sampling 04/06/2015 shows an mpT1C pN0, stage IA invasive ductal carcinoma, grade 2 with negative margins, repeat HER-2 (2) again negative             (a) status post right saline implant placement and left reduction mammoplasty 08/03/2015 with benign pathology   (5) adjuvant radiation not indicated   (6) tamoxifen started neoadjuvantly on 02/25/2015, completing 5 years December 2021  INTERVAL HISTORY:  Brooke [name] presents to the [facility] today for routine follow-up for her history of breast cancer.  Overall, she reports feeling quite well.   Her most recent  mammogram was completed on 11/08/2019 and showed no evidence of malignancy and breast density category B.   Brooke [name] overall is doing well.  We updated her medication list past medical history problem list today.  She continues to see her primary care regularly.  She notes that she is due for Pap smear and colon cancer screening.  She does not want to undergo colonoscopy.  She has not had a repeat mammogram since last year.  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  She walks every day at work.  Her only concern today is easy bruising.  She says that she will get bruises and not realize where that came from.  This concerns her.  She did undergo evaluation for this last year with Dr. Jana Hakim who reviewed a DIC panel and her blood film and found no etiology for this concern.  REVIEW OF SYSTEMS:  Review of Systems  Constitutional:  Negative for appetite change, chills, fatigue, fever and unexpected weight change.  HENT:   Negative for hearing loss, lump/mass and trouble swallowing.   Eyes:  Negative for eye problems and icterus.  Respiratory:  Negative for chest tightness, cough and shortness of breath.   Cardiovascular:  Negative for chest pain, leg swelling and palpitations.  Gastrointestinal:  Negative for abdominal distention, abdominal pain, constipation, diarrhea, nausea and vomiting.  Endocrine: Negative for hot flashes.  Genitourinary:  Negative for difficulty urinating.   Musculoskeletal:  Negative for arthralgias.  Skin:  Negative for itching and rash.  Neurological:  Negative for dizziness, extremity weakness, headaches and numbness.  Hematological:  Negative for adenopathy. Bruises/bleeds easily.  Psychiatric/Behavioral:  Negative for depression. The patient is not nervous/anxious.   Breast: Denies any new nodularity, masses, tenderness, nipple changes, or nipple discharge.       PAST  MEDICAL/SURGICAL HISTORY:  Past Medical History:  Diagnosis Date   Anemia    Anxiety    Breast cancer ([place])    Breast cancer of upper-inner quadrant of right female breast ([place]) 02/18/2015   Chronic headaches    Depression    Fatigue    Hot flashes    Yeast infection    Past Surgical History:  Procedure Laterality Date   BREAST LUMPECTOMY Left 2017   BREAST LUMPECTOMY WITH RADIOACTIVE SEED LOCALIZATION Left 04/06/2015   BREAST LUMPECTOMY WITH RADIOACTIVE SEED LOCALIZATION Left 04/06/2015   Procedure: LEFT BREAST LUMPECTOMY WITH RADIOACTIVE SEED LOCALIZATION;  Surgeon: Alphonsa Overall, MD;  Location: [facility];  Service: General;  Laterality: Left;   BREAST RECONSTRUCTION WITH PLACEMENT OF TISSUE EXPANDER AND FLEX HD (ACELLULAR HYDRATED DERMIS) Right 04/06/2015   Procedure: RIGHT BREAST RECONSTRUCTION WITH PLACEMENT OF TISSUE EXPANDER, POSSIBLE ACELLULAR DERMIS;  Surgeon: Irene Limbo, MD;  Location: [facility];  Service: Plastics;  Laterality: Right;   BREAST REDUCTION SURGERY Left 08/03/2015   Procedure: LEFT BREAST REDUCTION FOR SYMMETRY  (BREAST);  Surgeon: Irene Limbo, MD;  Location: [facility];  Service: Plastics;  Laterality: Left;   CHOLECYSTECTOMY     GASTRIC BYPASS     LIPOSUCTION WITH LIPOFILLING N/A 08/03/2015   Procedure: LIPOSUCTION WITH LIPOFILLING ;  Surgeon: Irene Limbo, MD;  Location: [facility];  Service: Plastics;  Laterality: N/A;   MASTECTOMY Right 2017   MASTECTOMY W/ SENTINEL NODE BIOPSY Right    MASTECTOMY W/ SENTINEL NODE BIOPSY Right 04/06/2015   Procedure: RIGHT MASTECTOMY WITH SENTINEL LYMPH NODE BIOPSY;  Surgeon: Alphonsa Overall, MD;  Location: [facility];  Service: General;  Laterality: Right;   REDUCTION MAMMAPLASTY Left 2017   REMOVAL OF BILATERAL TISSUE EXPANDERS WITH PLACEMENT OF BILATERAL BREAST IMPLANTS Right 08/03/2015   Procedure: REMOVAL OF RIGHT TISSUE EXPANDERS WITH PLACEMENT OF IMPLANT;  Surgeon: Irene Limbo, MD;  Location:  [facility];  Service: Plastics;  Laterality: Right;   TUBAL LIGATION       ALLERGIES:  No Known Allergies   CURRENT MEDICATIONS:  Outpatient Encounter Medications as of 11/24/2020  Medication Sig   gabapentin (NEURONTIN) 300 MG capsule Take 1 capsule (300 mg total) by mouth at bedtime.   meloxicam (MOBIC) 7.5 MG tablet Take 1 tablet (7.5 mg total) by mouth daily. With food   tizanidine (ZANAFLEX) 2 MG capsule Take 1 capsule (2 mg total) by mouth 3 (three) times daily.   traZODone (DESYREL) 50 MG tablet TAKE 1 TABLET BY MOUTH AT BEDTIME AS NEEDED FOR SLEEP   valACYclovir (VALTREX) 500 MG tablet TAKE 1 TABLET BY MOUTH EVERY DAY   venlafaxine XR (EFFEXOR-XR) 150 MG 24 hr capsule Take 1 capsule (150 mg total) by mouth daily with breakfast.   No facility-administered encounter medications on file as of 11/24/2020.     ONCOLOGIC FAMILY HISTORY:  Family History  Problem Relation Age of Onset   Diabetes Mother    Hypertension Mother    Spinal muscular atrophy Mother    Irregular heart beat Mother    Anxiety disorder Mother    Arthritis Mother  Cancer Other        maternal great grandfather dx. unspecified type cancer   Heart attack Maternal Grandmother    Heart attack Maternal Uncle    Cancer Cousin        paternal 1st cousin dx. with cancer that was typically a childhood cancer   Breast cancer Neg Hx     GENETIC COUNSELING/TESTING: negative  SOCIAL HISTORY:     PHYSICAL EXAMINATION:  Vital Signs: Vitals:   11/24/20 0852  BP: 133/75  Pulse: 66  Resp: 18  Temp: (!) 97.4 F (36.3 C)  SpO2: 100%   Filed Weights   11/24/20 0852  Weight: 236 lb 14.4 oz (107.5 kg)   General: Well-nourished, well-appearing female in no acute distress.  Unaccompanied HEENT: Head is normocephalic.  Sclerae anicteric. Breast Exam:  declined GU: Deferred.  Neuro: No focal deficits. Steady gait.  Psych: Mood and affect normal and appropriate for situation.   Extremities: No edema. Skin: Warm and dry.  Ecchymosis noted on right lower leg and left upper thigh  LABS Appointment on 11/24/2020  Component Date Value Ref Range Status   Sodium 11/24/2020 141  135 - 145 mmol/L Final   Potassium 11/24/2020 3.7  3.5 - 5.1 mmol/L Final   Chloride 11/24/2020 105  98 - 111 mmol/L Final   CO2 11/24/2020 27  22 - 32 mmol/L Final   Glucose, Bld 11/24/2020 69 (L)  70 - 99 mg/dL Final   Glucose reference range applies only to samples taken after fasting for at least 8 hours.   BUN 11/24/2020 12  6 - 20 mg/dL Final   Creatinine, Ser 11/24/2020 0.82  0.44 - 1.00 mg/dL Final   Calcium 11/24/2020 8.9  8.9 - 10.3 mg/dL Final   Total Protein 11/24/2020 7.0  6.5 - 8.1 g/dL Final   Albumin 11/24/2020 3.8  3.5 - 5.0 g/dL Final   AST 11/24/2020 22  15 - 41 U/L Final   ALT 11/24/2020 18  0 - 44 U/L Final   Alkaline Phosphatase 11/24/2020 84  38 - 126 U/L Final   Total Bilirubin 11/24/2020 0.6  0.3 - 1.2 mg/dL Final   GFR, Estimated 11/24/2020 >60  >60 mL/min Final   Comment: (NOTE) Calculated using the CKD-EPI Creatinine Equation (2021)    Anion gap 11/24/2020 9  5 - 15 Final   Performed at [facility], [place] [address] [place], [place] [address]   WBC 11/24/2020 5.0  4.0 - 10.5 K/uL Final   RBC 11/24/2020 3.85 (L)  3.87 - 5.11 MIL/uL Final   Hemoglobin 11/24/2020 11.8 (L)  12.0 - 15.0 g/dL Final   HCT 11/24/2020 37.2  36.0 - 46.0 % Final   MCV 11/24/2020 96.6  80.0 - 100.0 fL Final   MCH 11/24/2020 30.6  26.0 - 34.0 pg Final   MCHC 11/24/2020 31.7  30.0 - 36.0 g/dL Final   RDW 11/24/2020 14.1  11.5 - 15.5 % Final   Platelets 11/24/2020 327  150 - 400 K/uL Final   nRBC 11/24/2020 0.0  0.0 - 0.2 % Final   Neutrophils Relative % 11/24/2020 48  % Final   Neutro Abs 11/24/2020 2.4  1.7 - 7.7 K/uL Final   Lymphocytes Relative 11/24/2020 36  % Final   Lymphs Abs 11/24/2020 1.8  0.7 - 4.0 K/uL Final   Monocytes Relative 11/24/2020 9  %  Final   Monocytes Absolute 11/24/2020 0.5  0.1 - 1.0 K/uL Final   Eosinophils Relative 11/24/2020  5  % Final   Eosinophils Absolute 11/24/2020 0.3  0.0 - 0.5 K/uL Final   Basophils Relative 11/24/2020 2  % Final   Basophils Absolute 11/24/2020 0.1  0.0 - 0.1 K/uL Final   Immature Granulocytes 11/24/2020 0  % Final   Abs Immature Granulocytes 11/24/2020 0.01  0.00 - 0.07 K/uL Final   Performed at [facility], [place] [address], [place], [place] [address]     DIAGNOSTIC IMAGING:  Most recent mammogram: 11/2019    ASSESSMENT AND PLAN:  Ms.. Bovard is a pleasant 55 y.o. female with history of stage Ia estrogen and progesterone positive breast cancer diagnosed in February 2017 status post right mastectomy, and adjuvant tamoxifen x5 years completing in December 2021.  [facility] for surveillance and routine follow-up.   1. History of breast cancer:  Ms. Rizzolo is currently clinically and radiographically without evidence of disease or recurrence of breast cancer.  She is overdue for her mammogram.  I recommended that she get this scheduled at the breast center.  She will return in 1 year for long-term survivorship follow-up.  I encouraged her to call me with any questions or concerns before her next visit at the cancer center, and I would be happy to see her sooner, if needed.    2.  Easy bruising: Her platelet count is normal today.  I reviewed with her that last year she discussed this with Dr. Jana Hakim and he found no abnormalities on her lab panels that he did.  She really would like the panel repeated.  I have placed orders for this, however we cannot add this onto the labs she is already had drawn.  So instead she will return in 2 to 6 weeks at her convenience to have her labs drawn.  3. Bone health:  She was given education on specific food and activities to promote bone health.  4. Cancer screening:  Due to Ms. Elenbaas's history and her age, she  should receive screening for skin cancers, colon cancer, and gynecologic cancers.  She does not want to have a colonoscopy.  I have placed orders for Cologuard today.  She was encouraged to follow-up with her PCP for appropriate cancer screenings.   5. Health maintenance and wellness promotion: Ms. Morency was encouraged to consume 5-7 servings of fruits and vegetables per day. She was also encouraged to engage in moderate to vigorous exercise for 30 minutes per day most days of the week. She was instructed to limit her alcohol consumption and continue to abstain from tobacco use.    Dispo:  -Return to cancer center in one year for LTS -labs in 2-6 weeks -Mammogram due -PAP smear due -Cologuard ordered   Total encounter time: 20 minutes in face-to-face visit time, chart review, lab review, care coordination, and documentation of the encounter.  Wilber Bihari, NP 11/24/20 8:59 AM Medical Oncology and Hematology [facility] [place], [place] [address] Tel. [phone]    Fax. [phone]  *Total Encounter Time as defined by the Centers for Medicare and Medicaid Services includes, in addition to the face-to-face time of a patient visit (documented in the note above) non-face-to-face time: obtaining and reviewing outside history, ordering and reviewing medications, tests or procedures, care coordination (communications with other health care professionals or caregivers) and documentation in the medical record.    Note: PRIMARY CARE PROVIDER [name], [address]. [address] [phone]

## 2020-11-24 NOTE — Patient Instructions (Signed)
Bone Health Bones protect organs, store calcium, anchor muscles, and support the whole body. Keeping your bones strong is important, especially as you get older. You can take actions to help keep your bones strong and healthy. Why is keeping my bones healthy important? Keeping your bones healthy is important because your body constantly replaces bone cells. Cells get old, and new cells take their place. As we age, we lose bone cells because the body may not be able to make enough new cells to replace the old cells. The amount of bone cells and bone tissue you have is referred to as bone mass. The higher your bone mass, the stronger your bones. The aging process leads to an overall loss of bone mass in the body, which can increase the likelihood of: Broken bones. A condition in which the bones become weak and brittle (osteoporosis). A large decline in bone mass occurs in older adults. In women, it occurs about the time of menopause. What actions can I take to keep my bones healthy? Good health habits are important for maintaining healthy bones. This includes eating nutritious foods and exercising regularly. To have healthy bones, you need to get enough of the right minerals and vitamins. Most nutrition experts recommend getting these nutrients from the foods that you eat. In some cases, taking supplements may also be recommended. Doing certain types of exercise is also important for bone health. What are the nutritional recommendations for healthy bones? Eating a well-balanced diet with plenty of calcium and vitamin D will help to protect your bones. Nutritional recommendations vary from person to person. Ask your health care provider what is healthy for you. Here are some general guidelines. Get enough calcium Calcium is the most important (essential) mineral for bone health. Most people can get enough calcium from their diet, but supplements may be recommended for people who are at risk for  osteoporosis. Good sources of calcium include: Dairy products, such as low-fat or nonfat milk, cheese, and yogurt. Dark green leafy vegetables, such as bok choy and broccoli. Foods that have calcium added to them (are fortified). Foods that may be fortified with calcium include orange juice, cereal, bread, soy beverages, and tofu products. Nuts, such as almonds. Follow these recommended amounts for daily calcium intake: Infants, 0-6 months: 200 mg. Infants, 6-12 months: 260 mg. Children, age 31-3: 700 mg. Children, age 66-8: 1,000 mg. Children, age 62-13: 1,300 mg. Teens, age 667-18: 1,300 mg. Adults, age 53-50: 1,000 mg. Adults, age 669-70: Men: 1,000 mg. Women: 1,200 mg. Adults, age 665 or older: 1,200 mg. Pregnant and breastfeeding females: Teens: 1,300 mg. Adults: 1,000 mg. Get enough vitamin D Vitamin D is the most essential vitamin for bone health. It helps the body absorb calcium. Sunlight stimulates the skin to make vitamin D, so be sure to get enough sunlight. If you live in a cold climate or you do not get outside often, your health care provider may recommend that you take vitamin D supplements. Good sources of vitamin D in your diet include: Egg yolks. Saltwater fish. Milk and cereal fortified with vitamin D. Follow these recommended amounts for daily vitamin D intake: Infants, 0-12 months: 400 international units (IU). Children and teens, age 31-18: 24 international units. Adults, age 39 or younger: 24 international units. Adults, age 668 or older: 21-1,000 international units. Get other important nutrients Other nutrients that are important for bone health include: Phosphorus. This mineral is found in meat, poultry, dairy foods, nuts, and legumes. The recommended daily  intake for adult men and adult women is 700 mg. Magnesium. This mineral is found in seeds, nuts, dark green vegetables, and legumes. The recommended daily intake for adult men is 400-420 mg. For adult women,  it is 310-320 mg. Vitamin K. This vitamin is found in green leafy vegetables. The recommended daily intake is 120 mcg for adult men and 90 mcg for adult women. What type of physical activity is best for building and maintaining healthy bones? Weight-bearing and strength-building activities are important for building and maintaining healthy bones. Weight-bearing activities cause muscles and bones to work against gravity. Strength-building activities increase the strength of the muscles that support bones. Weight-bearing and muscle-building activities include: Walking and hiking. Jogging and running. Dancing. Gym exercises. Lifting weights. Tennis and racquetball. Climbing stairs. Aerobics. Adults should get at least 30 minutes of moderate physical activity on most days. Children should get at least 60 minutes of moderate physical activity on most days. Ask your health care provider what type of exercise is best for you. How can I find out if my bone mass is low? Bone mass can be measured with an X-ray test called a bone mineral density (BMD) test. This test is recommended for all women who are age 17 or older. It may also be recommended for: Men who are age 100 or older. People who are at risk for osteoporosis because of: Having a long-term disease that weakens bones, such as kidney disease or rheumatoid arthritis. Having menopause earlier than normal. Taking medicine that weakens bones, such as steroids, thyroid hormones, or hormone treatment for breast cancer or prostate cancer. Smoking. Drinking three or more alcoholic drinks a day. Being underweight. Sedentary lifestyle. If you find that you have a low bone mass, you may be able to prevent osteoporosis or further bone loss by changing your diet and lifestyle. Where can I find more information? Bone Health & Osteoporosis Foundation: [employer] of Health: www.bones.SouthExposed.es International Osteoporosis  Foundation: Administrator.iofbonehealth.org Summary The aging process leads to an overall loss of bone mass in the body, which can increase the likelihood of broken bones and osteoporosis. Eating a well-balanced diet with plenty of calcium and vitamin D will help to protect your bones. Weight-bearing and strength-building activities are also important for building and maintaining strong bones. Bone mass can be measured with an X-ray test called a bone mineral density (BMD) test. This information is not intended to replace advice given to you by your health care provider. Make sure you discuss any questions you have with your health care provider. Document Revised: 06/03/2020 Document Reviewed: 06/03/2020 Elsevier Patient Education  Brooke [name].

## 2020-12-02 ENCOUNTER — Inpatient Hospital Stay

## 2020-12-02 ENCOUNTER — Other Ambulatory Visit: Payer: Self-pay | Admitting: Physician Assistant

## 2020-12-02 ENCOUNTER — Other Ambulatory Visit: Payer: Self-pay

## 2020-12-02 DIAGNOSIS — Z1231 Encounter for screening mammogram for malignant neoplasm of breast: Secondary | ICD-10-CM

## 2020-12-02 DIAGNOSIS — Z853 Personal history of malignant neoplasm of breast: Secondary | ICD-10-CM | POA: Diagnosis not present

## 2020-12-02 DIAGNOSIS — C50211 Malignant neoplasm of upper-inner quadrant of right female breast: Secondary | ICD-10-CM

## 2020-12-02 LAB — CBC WITH DIFFERENTIAL/PLATELET
Abs Immature Granulocytes: 0.02 10*3/uL (ref 0.00–0.07)
Basophils Absolute: 0.1 10*3/uL (ref 0.0–0.1)
Basophils Relative: 1 %
Eosinophils Absolute: 0 10*3/uL (ref 0.0–0.5)
Eosinophils Relative: 1 %
HCT: 35.3 % — ABNORMAL LOW (ref 36.0–46.0)
Hemoglobin: 12 g/dL (ref 12.0–15.0)
Immature Granulocytes: 0 %
Lymphocytes Relative: 30 %
Lymphs Abs: 1.8 10*3/uL (ref 0.7–4.0)
MCH: 31.1 pg (ref 26.0–34.0)
MCHC: 34 g/dL (ref 30.0–36.0)
MCV: 91.5 fL (ref 80.0–100.0)
Monocytes Absolute: 0.6 10*3/uL (ref 0.1–1.0)
Monocytes Relative: 9 %
Neutro Abs: 3.5 10*3/uL (ref 1.7–7.7)
Neutrophils Relative %: 59 %
Platelets: 301 10*3/uL (ref 150–400)
RBC: 3.86 MIL/uL — ABNORMAL LOW (ref 3.87–5.11)
RDW: 13.8 % (ref 11.5–15.5)
WBC: 6 10*3/uL (ref 4.0–10.5)
nRBC: 0 % (ref 0.0–0.2)

## 2020-12-02 LAB — CMP (CANCER CENTER ONLY)
ALT: 69 U/L — ABNORMAL HIGH (ref 0–44)
AST: 80 U/L — ABNORMAL HIGH (ref 15–41)
Albumin: 4.2 g/dL (ref 3.5–5.0)
Alkaline Phosphatase: 92 U/L (ref 38–126)
Anion gap: 10 (ref 5–15)
BUN: 17 mg/dL (ref 6–20)
CO2: 30 mmol/L (ref 22–32)
Calcium: 8.6 mg/dL — ABNORMAL LOW (ref 8.9–10.3)
Chloride: 96 mmol/L — ABNORMAL LOW (ref 98–111)
Creatinine: 0.95 mg/dL (ref 0.44–1.00)
GFR, Estimated: >60 mL/min (ref >60)
Glucose, Bld: 119 mg/dL — ABNORMAL HIGH (ref 70–99)
Potassium: 3.8 mmol/L (ref 3.5–5.1)
Sodium: 136 mmol/L (ref 135–145)
Total Bilirubin: 1.1 mg/dL (ref 0.3–1.2)
Total Protein: 7.5 g/dL (ref 6.5–8.1)

## 2020-12-07 ENCOUNTER — Other Ambulatory Visit: Payer: Self-pay | Admitting: Oncology

## 2021-01-05 ENCOUNTER — Ambulatory Visit

## 2021-01-15 ENCOUNTER — Other Ambulatory Visit: Payer: Self-pay

## 2021-01-15 ENCOUNTER — Ambulatory Visit (HOSPITAL_COMMUNITY): Payer: Self-pay | Attending: Internal Medicine

## 2021-01-15 ENCOUNTER — Encounter (HOSPITAL_COMMUNITY): Payer: Self-pay | Admitting: *Deleted

## 2021-01-15 ENCOUNTER — Ambulatory Visit (HOSPITAL_COMMUNITY)
Admission: EM | Admit: 2021-01-15 | Discharge: 2021-01-15 | Disposition: A | Discharge status: Home / Self Care | Payer: No Typology Code available for payment source | Attending: Internal Medicine | Admitting: Internal Medicine

## 2021-01-15 DIAGNOSIS — S82045A Nondisplaced comminuted fracture of left patella, initial encounter for closed fracture: Secondary | ICD-10-CM

## 2021-01-15 DIAGNOSIS — M79642 Pain in left hand: Secondary | ICD-10-CM

## 2021-01-15 DIAGNOSIS — W19XXXA Unspecified fall, initial encounter: Secondary | ICD-10-CM

## 2021-01-15 DIAGNOSIS — M79641 Pain in right hand: Secondary | ICD-10-CM

## 2021-01-15 MED ORDER — ACETAMINOPHEN-CODEINE #3 300-30 MG PO TABS: 1.0000 | ORAL_TABLET | Freq: Four times a day (QID) | ORAL | 0 refills | Status: DC | PRN

## 2021-01-15 NOTE — ED Provider Notes (Signed)
[place]    CSN: [id number] Arrival date & time: 01/15/21  1718      History   Chief Complaint Chief Complaint  Patient presents with   Fall    HPI Brooke [name] is a 56 y.o. female.   HPI Patient presents today with for evaluation of left knee pain and right hand pain following a fall today. This is a work related injury that occurred today. Patient reports while ambulating to [employer] she tripped over chairs and landed directly on her left knee and right hand. She experienced pain which has progressed throughout the day. She has taken Ibuprofen without relief of pain. Pain with weightbearing involving the left lower extremity. No prior injury involving the left knee or right hand.  Past Medical History:  Diagnosis Date   Alcohol use disorder, mild, abuse 06/26/2019   Anemia    Anxiety    Breast cancer ([place])    Breast cancer of upper-inner quadrant of right female breast ([place]) 02/18/2015   Chronic headaches    Chronic headaches    Depression    Fatigue    Hot flashes    Yeast infection     Patient Active Problem List   Diagnosis Date Noted   Major depressive disorder, recurrent episode, mild ([place]) 06/26/2019   GAD (generalized anxiety disorder) 06/26/2019   Alcohol use disorder, mild, abuse 06/26/2019   Genetic testing 03/09/2015   Malignant neoplasm of upper-inner quadrant of right breast in female, estrogen receptor positive ([place]) 02/18/2015   Chronic headaches     Past Surgical History:  Procedure Laterality Date   BREAST LUMPECTOMY Left 2017   BREAST LUMPECTOMY WITH RADIOACTIVE SEED LOCALIZATION Left 04/06/2015   BREAST LUMPECTOMY WITH RADIOACTIVE SEED LOCALIZATION Left 04/06/2015   Procedure: LEFT BREAST LUMPECTOMY WITH RADIOACTIVE SEED LOCALIZATION;  Surgeon: Alphonsa Overall, MD;  Location: [facility];  Service: General;  Laterality: Left;   BREAST RECONSTRUCTION WITH PLACEMENT OF TISSUE EXPANDER AND FLEX HD (ACELLULAR HYDRATED DERMIS) Right  04/06/2015   Procedure: RIGHT BREAST RECONSTRUCTION WITH PLACEMENT OF TISSUE EXPANDER, POSSIBLE ACELLULAR DERMIS;  Surgeon: Irene Limbo, MD;  Location: [facility];  Service: Plastics;  Laterality: Right;   BREAST REDUCTION SURGERY Left 08/03/2015   Procedure: LEFT BREAST REDUCTION FOR SYMMETRY  (BREAST);  Surgeon: Irene Limbo, MD;  Location: [facility];  Service: Plastics;  Laterality: Left;   CHOLECYSTECTOMY     GASTRIC BYPASS     LIPOSUCTION WITH LIPOFILLING N/A 08/03/2015   Procedure: LIPOSUCTION WITH LIPOFILLING ;  Surgeon: Irene Limbo, MD;  Location: [facility];  Service: Plastics;  Laterality: N/A;   MASTECTOMY Right 2017   MASTECTOMY W/ SENTINEL NODE BIOPSY Right    MASTECTOMY W/ SENTINEL NODE BIOPSY Right 04/06/2015   Procedure: RIGHT MASTECTOMY WITH SENTINEL LYMPH NODE BIOPSY;  Surgeon: Alphonsa Overall, MD;  Location: [facility];  Service: General;  Laterality: Right;   REDUCTION MAMMAPLASTY Left 2017   REMOVAL OF BILATERAL TISSUE EXPANDERS WITH PLACEMENT OF BILATERAL BREAST IMPLANTS Right 08/03/2015   Procedure: REMOVAL OF RIGHT TISSUE EXPANDERS WITH PLACEMENT OF IMPLANT;  Surgeon: Irene Limbo, MD;  Location: [facility];  Service: Plastics;  Laterality: Right;   TUBAL LIGATION      OB History     Gravida  4   Para  2   Term      Preterm      AB  1   Living         SAB  1   IAB      Ectopic      Multiple      Live Births               Home Medications    Prior to Admission medications   Medication Sig Start Date End Date Taking? Authorizing Provider  traZODone (DESYREL) 50 MG tablet TAKE 1 TABLET BY MOUTH AT BEDTIME AS NEEDED FOR SLEEP 07/28/19   Pucilowski, Marchia Bond, MD  valACYclovir (VALTREX) 500 MG tablet TAKE 1 TABLET BY MOUTH EVERY DAY 12/07/20   Magrinat, Virgie Dad, MD  venlafaxine XR (EFFEXOR-XR) 150 MG 24 hr capsule Take 1 capsule (150 mg total) by mouth daily with breakfast. 11/22/19 01/21/20   Magrinat, Virgie Dad, MD    Family History Family History  Problem Relation Age of Onset   Diabetes Mother    Hypertension Mother    Spinal muscular atrophy Mother    Irregular heart beat Mother    Anxiety disorder Mother    Arthritis Mother    Cancer Other        maternal great grandfather dx. unspecified type cancer   Heart attack Maternal Grandmother    Heart attack Maternal Uncle    Cancer Cousin        paternal 1st cousin dx. with cancer that was typically a childhood cancer   Breast cancer Neg Hx     Social History Social History   Tobacco Use   Smoking status: Never   Smokeless tobacco: Never  Substance Use Topics   Alcohol use: Yes    Alcohol/week: 4.0 - 5.0 standard drinks    Types: 4 - 5 Standard drinks or equivalent per week    Comment: wine and beer on weekends   Drug use: No     Allergies   Patient has no known allergies.   Review of Systems Review of Systems Pertinent negatives listed in HPI  Physical Exam Triage Vital Signs ED Triage Vitals  Enc Vitals Group     BP 01/15/21 1749 (!) 149/97     Pulse Rate 01/15/21 1749 77     Resp 01/15/21 1749 18     Temp 01/15/21 1749 98.7 F (37.1 C)     Temp src --      SpO2 01/15/21 1749 99 %     Weight --      Height --      Head Circumference --      Peak Flow --      Pain Score 01/15/21 1747 10     Pain Loc --      Pain Edu? --      Excl. in [place]? --    No data found.  Updated Vital Signs BP (!) 149/97    Pulse 77    Temp 98.7 F (37.1 C)    Resp 18    LMP 04/05/2016 (Approximate)    SpO2 99%   Visual Acuity Right Eye Distance:   Left Eye Distance:   Bilateral Distance:    Right Eye Near:   Left Eye Near:    Bilateral Near:     Physical Exam Constitutional:      Appearance: Normal appearance.  HENT:     Head: Normocephalic and atraumatic.  Cardiovascular:     Rate and Rhythm: Normal rate and regular rhythm.  Pulmonary:     Effort: Pulmonary effort is normal.     Breath sounds:  Normal breath sounds.  Musculoskeletal:  Right hand: Tenderness present. No swelling. Normal range of motion. Normal strength.     Cervical back: Normal range of motion and neck supple.     Right knee: Normal.     Left knee: Swelling and bony tenderness present. Decreased range of motion.  Skin:    Capillary Refill: Capillary refill takes less than 2 seconds.  Neurological:     General: No focal deficit present.     Mental Status: She is alert and oriented to person, place, and time.     UC Treatments / Results  Labs (all labs ordered are listed, but only abnormal results are displayed) Labs Reviewed - No data to display  EKG   Radiology DG Knee Complete 4 Views Left  Result Date: 01/15/2021 CLINICAL DATA:  Fall, left knee pain EXAM: LEFT KNEE - COMPLETE 4+ VIEW COMPARISON:  None. FINDINGS: There is an acute, longitudinal, mildly comminuted fracture through the medial aspect of the patella with minimal displacement of the fracture fragments which appear in normal anatomic alignment. Moderate lipohemarthrosis noted. Normal overall alignment. Mild bicompartmental degenerative arthritis noted within the medial and patellofemoral compartments with osteophyte formation. Remote healed proximal left fibular metadiaphyseal fracture. IMPRESSION: Acute, mildly comminuted, anatomically aligned longitudinal patellar fracture. Moderate lipohemarthrosis. Electronically Signed   By: Fidela Salisbury M.D.   On: 01/15/2021 18:21   DG Hand Complete Right  Result Date: 01/15/2021 CLINICAL DATA:  Fall, left hand pain EXAM: RIGHT HAND - COMPLETE 3+ VIEW COMPARISON:  None. FINDINGS: The examination is limited by suboptimal positioning on lateral examination. Remote appearing fracture deformity of the distal left radius is identified with probable moderate residual dorsal tilt deformity. No acute fracture or dislocation. Degenerative changes are noted within the DIP joints of the finger and first MCP joint of  the thumb. Remaining joint spaces are preserved. Soft tissues are unremarkable. IMPRESSION: No acute fracture or dislocation. Electronically Signed   By: Fidela Salisbury M.D.   On: 01/15/2021 18:19    Procedures Procedures (including critical care time)  Medications Ordered in UC Medications - No data to display  Initial Impression / Assessment and Plan / UC Course  I have reviewed the triage vital signs and the nursing notes.  Pertinent labs & imaging results that were available during my care of the patient were reviewed by me and considered in my medical decision making (see chart for details).    Closed nondisplaced left patella fracture, work related injury, applied knee immobilizer patient is to remain nonweightbearing until follow-up with orthopedic.  Patient also supplied with crutches and is able to ambulate independently while here in clinic.  Patient also encouraged to apply ice to knee as tolerated during the day.  Prescribed short course of Tylenol 3's for acute pain.  Imaging of right hand negative for any acute injury.  Patient advised of degenerative changes and encouraged to take naproxen  for inflammation and pain management.  Patient to follow-up with [facility] tomorrow.  Final Clinical Impressions(s) / UC Diagnoses   Final diagnoses:  Closed nondisplaced comminuted fracture of left patella, initial encounter     Discharge Instructions      Remain nonweightbearing until you follow-up with orthopedics.  You can go to orthopedic urgent care listed above we can hours are Saturday 9 AM to 2 PM and Sunday 10 AM to 2 PM during the week Monday through Friday 5:30 PM to 9 PM.  Also take Aleve to help decrease the inflammation.  While sitting apply ice  to your knee to  swelling several times a day to reduce swelling.    ED Prescriptions     Medication Sig Dispense Auth. Provider   acetaminophen-codeine (TYLENOL #3) 300-30 MG tablet Take 1-2 tablets by mouth  every 6 (six) hours as needed for moderate pain. 15 tablet Scot Jun, FNP      PDMP not reviewed this encounter.   Scot Jun, [place] 01/15/21 2037

## 2021-01-15 NOTE — Discharge Instructions (Addendum)
Remain nonweightbearing until you follow-up with orthopedics.  You can go to orthopedic urgent care listed above we can hours are Saturday 9 AM to 2 PM and Sunday 10 AM to 2 PM during the week Monday through Friday 5:30 PM to 9 PM.  Also take Aleve to help decrease the inflammation.  While sitting apply ice to your knee to  swelling several times a day to reduce swelling.

## 2021-01-15 NOTE — ED Triage Notes (Signed)
Pt reports a trip and fall today . Pt reports Lt knee pain and rt hand pain after fall.

## 2021-01-16 ENCOUNTER — Telehealth (HOSPITAL_COMMUNITY): Payer: Self-pay

## 2021-01-16 ENCOUNTER — Telehealth (HOSPITAL_COMMUNITY): Payer: Self-pay | Admitting: Family Medicine

## 2021-01-16 NOTE — Telephone Encounter (Signed)
Called pt back.   No answer. Left voice message.

## 2021-01-16 NOTE — Telephone Encounter (Signed)
Patient needs note for light duty secondary to patella fx. Written.

## 2021-02-04 ENCOUNTER — Ambulatory Visit
Admission: RE | Admit: 2021-02-04 | Discharge: 2021-02-04 | Disposition: A | Discharge status: Home / Self Care | Payer: BC Managed Care – PPO | Source: Ambulatory Visit | Attending: Physician Assistant | Admitting: Physician Assistant

## 2021-02-04 ENCOUNTER — Other Ambulatory Visit: Payer: Self-pay

## 2021-02-04 DIAGNOSIS — Z1231 Encounter for screening mammogram for malignant neoplasm of breast: Secondary | ICD-10-CM

## 2021-02-23 ENCOUNTER — Telehealth: Payer: Self-pay

## 2021-02-23 NOTE — Telephone Encounter (Signed)
-----   Message from Gardenia Phlegm, NP sent at 02/22/2021  9:09 AM EST ----- Please see if the patient received this and plans on completing it. ----- Message ----- From: SYSTEM Sent: 02/22/2021  12:15 AM EST To: Gardenia Phlegm, NP

## 2021-03-19 DIAGNOSIS — I10 Essential (primary) hypertension: Secondary | ICD-10-CM | POA: Insufficient documentation

## 2021-07-16 ENCOUNTER — Other Ambulatory Visit: Payer: BC Managed Care – PPO

## 2021-07-16 ENCOUNTER — Telehealth: Payer: Self-pay | Admitting: *Deleted

## 2021-07-16 ENCOUNTER — Other Ambulatory Visit: Payer: Self-pay | Admitting: *Deleted

## 2021-07-16 DIAGNOSIS — R159 Full incontinence of feces: Secondary | ICD-10-CM

## 2021-07-16 DIAGNOSIS — C50211 Malignant neoplasm of upper-inner quadrant of right female breast: Secondary | ICD-10-CM

## 2021-07-16 NOTE — Telephone Encounter (Signed)
Pt called late this afternoon ( post 3pm) stating she is concerned and would like to have an appt soon..  She states concerns due to several episodes of incontinence- with " one occurring in my sleep "  She states " I read that this could be a sign of cancer and just want to get checked out "  This RN asked about any other issues with pt stating mild numbness and tingling in lower extremities.  She states over the past 2 months symptoms have been occurring intermittently with the episode of incontinence during her sleep last night - which got her more concerned.  This RN noted opening on provider list for NP on 7/17 at 215pm- offered to pt with pt stating she does not get off work until 3 pm.  This RN informed her above would be reviewed with provider and appt will be made.  No other issues at this time.

## 2021-07-19 ENCOUNTER — Ambulatory Visit (INDEPENDENT_AMBULATORY_CARE_PROVIDER_SITE_OTHER): Payer: BC Managed Care – PPO

## 2021-07-19 ENCOUNTER — Other Ambulatory Visit (HOSPITAL_COMMUNITY)
Admission: RE | Admit: 2021-07-19 | Discharge: 2021-07-19 | Disposition: A | Discharge status: Home / Self Care | Payer: BC Managed Care – PPO | Source: Ambulatory Visit | Attending: Obstetrics & Gynecology | Admitting: Obstetrics & Gynecology

## 2021-07-19 DIAGNOSIS — B3731 Acute candidiasis of vulva and vagina: Secondary | ICD-10-CM

## 2021-07-19 DIAGNOSIS — N898 Other specified noninflammatory disorders of vagina: Secondary | ICD-10-CM

## 2021-07-19 DIAGNOSIS — B379 Candidiasis, unspecified: Secondary | ICD-10-CM

## 2021-07-19 NOTE — Progress Notes (Unsigned)
..  SUBJECTIVE:  56 y.o. female complains of vaginal irritation/burning for 1 week(s). Denies abnormal vaginal bleeding or significant pelvic pain or fever. No UTI symptoms. Denies history of known exposure to STD.  Patient's last menstrual period was 04/05/2016 (approximate).  OBJECTIVE:  She appears well, afebrile. Urine dipstick: not done.  ASSESSMENT:  Vaginal irritation   PLAN:  GC, chlamydia, trichomonas, BVAG, CVAG probe sent to lab. Treatment: To be determined once lab results are received ROV prn if symptoms persist or worsen.

## 2021-07-20 ENCOUNTER — Inpatient Hospital Stay: Payer: BC Managed Care – PPO | Attending: Adult Health

## 2021-07-20 ENCOUNTER — Other Ambulatory Visit: Payer: Self-pay

## 2021-07-20 ENCOUNTER — Other Ambulatory Visit: Payer: Self-pay | Admitting: *Deleted

## 2021-07-20 DIAGNOSIS — D649 Anemia, unspecified: Secondary | ICD-10-CM | POA: Insufficient documentation

## 2021-07-20 DIAGNOSIS — Z170 Estrogen receptor positive status [ER+]: Secondary | ICD-10-CM | POA: Diagnosis not present

## 2021-07-20 DIAGNOSIS — Z79899 Other long term (current) drug therapy: Secondary | ICD-10-CM | POA: Insufficient documentation

## 2021-07-20 DIAGNOSIS — E538 Deficiency of other specified B group vitamins: Secondary | ICD-10-CM | POA: Insufficient documentation

## 2021-07-20 DIAGNOSIS — R194 Change in bowel habit: Secondary | ICD-10-CM | POA: Insufficient documentation

## 2021-07-20 DIAGNOSIS — Z9011 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: Secondary | ICD-10-CM | POA: Insufficient documentation

## 2021-07-20 DIAGNOSIS — R233 Spontaneous ecchymoses: Secondary | ICD-10-CM

## 2021-07-20 DIAGNOSIS — Z853 Personal history of malignant neoplasm of breast: Secondary | ICD-10-CM | POA: Insufficient documentation

## 2021-07-20 DIAGNOSIS — I10 Essential (primary) hypertension: Secondary | ICD-10-CM | POA: Insufficient documentation

## 2021-07-20 DIAGNOSIS — C50211 Malignant neoplasm of upper-inner quadrant of right female breast: Secondary | ICD-10-CM | POA: Insufficient documentation

## 2021-07-20 DIAGNOSIS — F458 Other somatoform disorders: Secondary | ICD-10-CM | POA: Insufficient documentation

## 2021-07-20 LAB — CMP (CANCER CENTER ONLY)
ALT: 24 U/L (ref 0–44)
AST: 25 U/L (ref 15–41)
Albumin: 4 g/dL (ref 3.5–5.0)
Alkaline Phosphatase: 90 U/L (ref 38–126)
Anion gap: 8 (ref 5–15)
BUN: 14 mg/dL (ref 6–20)
CO2: 24 mmol/L (ref 22–32)
Calcium: 8.9 mg/dL (ref 8.9–10.3)
Chloride: 107 mmol/L (ref 98–111)
Creatinine: 0.7 mg/dL (ref 0.44–1.00)
GFR, Estimated: >60 mL/min (ref >60)
Glucose, Bld: 105 mg/dL — ABNORMAL HIGH (ref 70–99)
Potassium: 4.1 mmol/L (ref 3.5–5.1)
Sodium: 139 mmol/L (ref 135–145)
Total Bilirubin: 0.3 mg/dL (ref 0.3–1.2)
Total Protein: 7.1 g/dL (ref 6.5–8.1)

## 2021-07-20 LAB — CBC WITH DIFFERENTIAL (CANCER CENTER ONLY)
Abs Immature Granulocytes: 0.01 10*3/uL (ref 0.00–0.07)
Basophils Absolute: 0.1 10*3/uL (ref 0.0–0.1)
Basophils Relative: 1 %
Eosinophils Absolute: 0.3 10*3/uL (ref 0.0–0.5)
Eosinophils Relative: 5 %
HCT: 32.7 % — ABNORMAL LOW (ref 36.0–46.0)
Hemoglobin: 10.7 g/dL — ABNORMAL LOW (ref 12.0–15.0)
Immature Granulocytes: 0 %
Lymphocytes Relative: 46 %
Lymphs Abs: 2.5 10*3/uL (ref 0.7–4.0)
MCH: 31.3 pg (ref 26.0–34.0)
MCHC: 32.7 g/dL (ref 30.0–36.0)
MCV: 95.6 fL (ref 80.0–100.0)
Monocytes Absolute: 0.5 10*3/uL (ref 0.1–1.0)
Monocytes Relative: 9 %
Neutro Abs: 2.2 10*3/uL (ref 1.7–7.7)
Neutrophils Relative %: 39 %
Platelet Count: 305 10*3/uL (ref 150–400)
RBC: 3.42 MIL/uL — ABNORMAL LOW (ref 3.87–5.11)
RDW: 14.6 % (ref 11.5–15.5)
WBC Count: 5.5 10*3/uL (ref 4.0–10.5)
nRBC: 0 % (ref 0.0–0.2)

## 2021-07-20 LAB — CERVICOVAGINAL ANCILLARY ONLY
Bacterial Vaginitis (gardnerella): NEGATIVE
Candida Glabrata: POSITIVE — AB
Candida Vaginitis: NEGATIVE
Chlamydia: NEGATIVE
Comment: NEGATIVE
Comment: NEGATIVE
Comment: NEGATIVE
Comment: NEGATIVE
Comment: NEGATIVE
Comment: NORMAL
Neisseria Gonorrhea: NEGATIVE
Trichomonas: NEGATIVE

## 2021-07-20 LAB — DIC (DISSEMINATED INTRAVASCULAR COAGULATION)PANEL
D-Dimer, Quant: 0.53 ug/mL-FEU — ABNORMAL HIGH (ref 0.00–0.50)
Fibrinogen: 351 mg/dL (ref 210–475)
INR: 0.9 (ref 0.8–1.2)
Platelets: 294 10*3/uL (ref 150–400)
Prothrombin Time: 12 seconds (ref 11.4–15.2)
Smear Review: NONE SEEN
aPTT: 23 seconds — ABNORMAL LOW (ref 24–36)

## 2021-07-21 ENCOUNTER — Other Ambulatory Visit: Payer: Self-pay | Admitting: *Deleted

## 2021-07-21 MED ORDER — FLUCONAZOLE 150 MG PO TABS: 150.0000 mg | ORAL_TABLET | Freq: Once | ORAL | 3 refills | Status: AC

## 2021-07-21 MED ORDER — BORIC ACID CRYS: 600.0000 mg | CRYSTALS | Freq: Every day | 2 refills | Status: AC

## 2021-07-21 NOTE — Addendum Note (Signed)
Addended by: Verita Schneiders A on: 07/21/2021 11:24 AM   Modules accepted: Orders

## 2021-07-23 ENCOUNTER — Other Ambulatory Visit: Payer: Self-pay

## 2021-07-23 ENCOUNTER — Encounter: Payer: Self-pay | Admitting: Adult Health

## 2021-07-23 ENCOUNTER — Inpatient Hospital Stay: Payer: BC Managed Care – PPO

## 2021-07-23 ENCOUNTER — Inpatient Hospital Stay (HOSPITAL_BASED_OUTPATIENT_CLINIC_OR_DEPARTMENT_OTHER): Payer: BC Managed Care – PPO | Admitting: Adult Health

## 2021-07-23 ENCOUNTER — Telehealth: Payer: Self-pay

## 2021-07-23 VITALS — BP 134/82 | HR 66 | Temp 97.7°F | Wt 236.6 lb

## 2021-07-23 DIAGNOSIS — C50211 Malignant neoplasm of upper-inner quadrant of right female breast: Secondary | ICD-10-CM

## 2021-07-23 DIAGNOSIS — D649 Anemia, unspecified: Secondary | ICD-10-CM

## 2021-07-23 DIAGNOSIS — R5383 Other fatigue: Secondary | ICD-10-CM

## 2021-07-23 DIAGNOSIS — E538 Deficiency of other specified B group vitamins: Secondary | ICD-10-CM | POA: Diagnosis not present

## 2021-07-23 DIAGNOSIS — R0989 Other specified symptoms and signs involving the circulatory and respiratory systems: Secondary | ICD-10-CM

## 2021-07-23 DIAGNOSIS — I10 Essential (primary) hypertension: Secondary | ICD-10-CM

## 2021-07-23 DIAGNOSIS — Z170 Estrogen receptor positive status [ER+]: Secondary | ICD-10-CM

## 2021-07-23 LAB — TSH: TSH: 0.604 u[IU]/mL (ref 0.350–4.500)

## 2021-07-23 LAB — RETIC PANEL
Immature Retic Fract: 11.8 % (ref 2.3–15.9)
RBC.: 3.54 MIL/uL — ABNORMAL LOW (ref 3.87–5.11)
Retic Count, Absolute: 78.6 10*3/uL (ref 19.0–186.0)
Retic Ct Pct: 2.2 % (ref 0.4–3.1)
Reticulocyte Hemoglobin: 33.8 pg (ref >27.9)

## 2021-07-23 LAB — IRON AND IRON BINDING CAPACITY (CC-WL,HP ONLY)
Iron: 93 ug/dL (ref 28–170)
Saturation Ratios: 20 % (ref 10.4–31.8)
TIBC: 455 ug/dL — ABNORMAL HIGH (ref 250–450)
UIBC: 362 ug/dL (ref 148–442)

## 2021-07-23 LAB — MAGNESIUM: Magnesium: 2.2 mg/dL (ref 1.7–2.4)

## 2021-07-23 LAB — VITAMIN D 25 HYDROXY (VIT D DEFICIENCY, FRACTURES): Vit D, 25-Hydroxy: 8.64 ng/mL — ABNORMAL LOW (ref 30–100)

## 2021-07-23 LAB — VITAMIN B12: Vitamin B-12: 88 pg/mL — ABNORMAL LOW (ref 180–914)

## 2021-07-23 LAB — SAVE SMEAR(SSMR), FOR PROVIDER SLIDE REVIEW

## 2021-07-23 MED ORDER — OMEPRAZOLE 40 MG PO CPDR: 40.0000 mg | DELAYED_RELEASE_CAPSULE | Freq: Every day | ORAL | 0 refills | Status: DC

## 2021-07-23 MED ORDER — TRIAMTERENE-HCTZ 37.5-25 MG PO CAPS: 1.0000 | ORAL_CAPSULE | Freq: Every day | ORAL | 0 refills | Status: DC

## 2021-07-23 MED ORDER — ERGOCALCIFEROL 1.25 MG (50000 UT) PO CAPS: 50000.0000 [IU] | ORAL_CAPSULE | ORAL | 2 refills | Status: DC

## 2021-07-23 MED ORDER — VALACYCLOVIR HCL 500 MG PO TABS: 500.0000 mg | ORAL_TABLET | Freq: Every day | ORAL | 0 refills | Status: DC

## 2021-07-23 NOTE — Telephone Encounter (Signed)
-----   Message from Gardenia Phlegm, NP sent at 07/23/2021  2:56 PM EDT ----- Please call patient and let her know that she has vitamin B12 and vitamin D deficiency.  She needs to take prescription vitamin D and recheck this lab in 3 months and she also needs B12 injections.  If she is agreeable to this plan let me know and I will send it in. ----- Message ----- From: Interface, Lab In [place] Sent: 07/23/2021   9:31 AM EDT To: Gardenia Phlegm, NP

## 2021-07-23 NOTE — Assessment & Plan Note (Signed)
Brooke [name] is a 56 year old woman with history of right-sided stage Ia breast cancer status post treatment.  She is normally seen in our survivorship program however ever she does not have a primary care provider and has some anemia today.  Due to her symptoms with anemia, stool changes, difficulty clearing her throat, and hypertension I recommended the following.  1.  I recommended that she start Maxzide 1 tablet daily.  I sent this to her local pharmacy.  I also gave her information about blood pressure, self monitoring at home with a blood pressure cuff, and information about the medication and reviewed this with her and her after visit summary.  2.  Anemia: This is normocytic anemia.  I sent off additional lab testing and we will evaluate this further.  She does not have any reason to have a slightly elevated D-dimer, she is asymptomatic.  I would recommend following up with primary care about this which I referred her to today.  3.  Stool changes: She has never undergone colonoscopy.  I placed a referral to GI today.  4. Globus sensation: The globus sensation she is experiencing is likely related to reflux.  I recommended GI evaluation for possible upper endoscopy.  I sent in omeprazole for her to take daily to see if it helps.  Sophina really needs a primary care provider to follow-up with her on her health maintenance issues and her hypertension.  I placed a referral for her to go to [facility].  I gave her 30-day supply of the above medications to get her until she can get in with primary care.  I recommended she follow-up with our office in 6 months for her regular cancer surveillance visit.

## 2021-07-23 NOTE — Patient Instructions (Signed)
Hypertension, Adult High blood pressure (hypertension) is when the force of blood pumping through the arteries is too strong. The arteries are the blood vessels that carry blood from the heart throughout the body. Hypertension forces the heart to work harder to pump blood and may cause arteries to become narrow or stiff. Untreated or uncontrolled hypertension can lead to a heart attack, heart failure, a stroke, kidney disease, and other problems. A blood pressure reading consists of a higher number over a lower number. Ideally, your blood pressure should be below 120/80. The first ("top") number is called the systolic pressure. It is a measure of the pressure in your arteries as your heart beats. The second ("bottom") number is called the diastolic pressure. It is a measure of the pressure in your arteries as the heart relaxes. What are the causes? The exact cause of this condition is not known. There are some conditions that result in high blood pressure. What increases the risk? Certain factors may make you more likely to develop high blood pressure. Some of these risk factors are under your control, including: Smoking. Not getting enough exercise or physical activity. Being overweight. Having too much fat, sugar, calories, or salt (sodium) in your diet. Drinking too much alcohol. Other risk factors include: Having a personal history of heart disease, diabetes, high cholesterol, or kidney disease. Stress. Having a family history of high blood pressure and high cholesterol. Having obstructive sleep apnea. Age. The risk increases with age. What are the signs or symptoms? High blood pressure may not cause symptoms. Very high blood pressure (hypertensive crisis) may cause: Headache. Fast or irregular heartbeats (palpitations). Shortness of breath. Nosebleed. Nausea and vomiting. Vision changes. Severe chest pain, dizziness, and seizures. How is this diagnosed? This condition is diagnosed by  measuring your blood pressure while you are seated, with your arm resting on a flat surface, your legs uncrossed, and your feet flat on the floor. The cuff of the blood pressure monitor will be placed directly against the skin of your upper arm at the level of your heart. Blood pressure should be measured at least twice using the same arm. Certain conditions can cause a difference in blood pressure between your right and left arms. If you have a high blood pressure reading during one visit or you have normal blood pressure with other risk factors, you may be asked to: Return on a different day to have your blood pressure checked again. Monitor your blood pressure at home for 1 week or longer. If you are diagnosed with hypertension, you may have other blood or imaging tests to help your health care provider understand your overall risk for other conditions. How is this treated? This condition is treated by making healthy lifestyle changes, such as eating healthy foods, exercising more, and reducing your alcohol intake. You may be referred for counseling on a healthy diet and physical activity. Your health care provider may prescribe medicine if lifestyle changes are not enough to get your blood pressure under control and if: Your systolic blood pressure is above 130. Your diastolic blood pressure is above 80. Your personal target blood pressure may vary depending on your medical conditions, your age, and other factors. Follow these instructions at home: Eating and drinking  Eat a diet that is high in fiber and potassium, and low in sodium, added sugar, and fat. An example of this eating plan is called the DASH diet. DASH stands for Dietary Approaches to Stop Hypertension. To eat this way: Eat   plenty of fresh fruits and vegetables. Try to fill one half of your plate at each meal with fruits and vegetables. Eat whole grains, such as whole-wheat pasta, brown rice, or whole-grain bread. Fill about one  fourth of your plate with whole grains. Eat or drink low-fat dairy products, such as skim milk or low-fat yogurt. Avoid fatty cuts of meat, processed or cured meats, and poultry with skin. Fill about one fourth of your plate with lean proteins, such as fish, chicken without skin, beans, eggs, or tofu. Avoid pre-made and processed foods. These tend to be higher in sodium, added sugar, and fat. Reduce your daily sodium intake. Many people with hypertension should eat less than 1,500 mg of sodium a day. Do not drink alcohol if: Your health care provider tells you not to drink. You are pregnant, may be pregnant, or are planning to become pregnant. If you drink alcohol: Limit how much you have to: 0-1 drink a day for women. 0-2 drinks a day for men. Know how much alcohol is in your drink. In the [place] one drink equals one 12 oz bottle of beer (355 mL), one 5 oz glass of wine (148 mL), or one 1 oz glass of hard liquor (44 mL). Lifestyle  Work with your health care provider to maintain a healthy body weight or to lose weight. Ask what an ideal weight is for you. Get at least 30 minutes of exercise that causes your heart to beat faster (aerobic exercise) most days of the week. Activities may include walking, swimming, or biking. Include exercise to strengthen your muscles (resistance exercise), such as Pilates or lifting weights, as part of your weekly exercise routine. Try to do these types of exercises for 30 minutes at least 3 days a week. Do not use any products that contain nicotine or tobacco. These products include cigarettes, chewing tobacco, and vaping devices, such as e-cigarettes. If you need help quitting, ask your health care provider. Monitor your blood pressure at home as told by your health care provider. Keep all follow-up visits. This is important. Medicines Take over-the-counter and prescription medicines only as told by your health care provider. Follow directions carefully. Blood  pressure medicines must be taken as prescribed. Do not skip doses of blood pressure medicine. Doing this puts you at risk for problems and can make the medicine less effective. Ask your health care provider about side effects or reactions to medicines that you should watch for. Contact a health care provider if you: Think you are having a reaction to a medicine you are taking. Have headaches that keep coming back (recurring). Feel dizzy. Have swelling in your ankles. Have trouble with your vision. Get help right away if you: Develop a severe headache or confusion. Have unusual weakness or numbness. Feel faint. Have severe pain in your chest or abdomen. Vomit repeatedly. Have trouble breathing. These symptoms may be an emergency. Get help right away. Call 911. Do not wait to see if the symptoms will go away. Do not drive yourself to the hospital. Summary Hypertension is when the force of blood pumping through your arteries is too strong. If this condition is not controlled, it may put you at risk for serious complications. Your personal target blood pressure may vary depending on your medical conditions, your age, and other factors. For most people, a normal blood pressure is less than 120/80. Hypertension is treated with lifestyle changes, medicines, or a combination of both. Lifestyle changes include losing weight, eating a healthy,   low-sodium diet, exercising more, and limiting alcohol. This information is not intended to replace advice given to you by your health care provider. Make sure you discuss any questions you have with your health care provider. Document Revised: 10/27/2020 Document Reviewed: 10/27/2020 Elsevier Patient Education  [place].  Triamterene; Hydrochlorothiazide Capsules or Tablets What is this medication? TRIAMTERENE; HYDROCHLOROTHIAZIDE (trye AM ter een; hye droe klor oh THYE a zide) treats high blood pressure. It helps your kidneys remove more fluid  and salt from your blood through the urine. It is a combination of two diuretics. This medicine may be used for other purposes; ask your health care provider or pharmacist if you have questions. COMMON BRAND NAME(S): Dyazide, Maxzide, Maxzide-25 What should I tell my care team before I take this medication? They need to know if you have any of these conditions: High blood sugar (diabetes) Immune system problems, like lupus Kidney disease or stones Liver disease Small amount of urine or difficulty passing urine An unusual or allergic reaction to triamterene, hydrochlorothiazide, sulfa drugs, other medications, foods, dyes, or preservatives Pregnant or trying to get pregnant Breast-feeding How should I use this medication? Take this medication by mouth. Take it as directed on the prescription label at the same time every day. You can take it with or without food. If it upsets your stomach, take it with food. Keep taking it unless your care team tells you to stop. Talk to your care team about the use of this medication in children. Special care may be needed. Overdosage: If you think you have taken too much of this medicine contact a poison control center or emergency room at once. NOTE: This medicine is only for you. Do not share this medicine with others. What if I miss a dose? If you miss a dose, take it as soon as you can. If it is almost time for your next dose, take only that dose. Do not take double or extra doses. What may interact with this medication? Do not take this medication with any of the following: Cidofovir Dofetilide Eplerenone Potassium supplements Tranylcypromine This medication may also interact with the following: Certain medications for blood pressure, heart disease like benazepril, lisinopril, losartan, valsartan Lithium Medications for diabetes Medications that relax muscles for surgery NSAIDs, medications for pain and inflammation, like ibuprofen or  naproxen Other diuretics Penicillin G potassium This list may not describe all possible interactions. Give your health care provider a list of all the medicines, herbs, non-prescription drugs, or dietary supplements you use. Also tell them if you smoke, drink alcohol, or use illegal drugs. Some items may interact with your medicine. What should I watch for while using this medication? Visit your care team for regular check-ups. You will need lab work done before you start this medication and regularly while you are taking it. Check your blood pressure regularly. Ask your care team what your blood pressure should be, and when you should contact them. This medication may increase blood sugar. Ask your care team if changes in diet or medications are needed if you have diabetes. You may need to be on a special diet while taking this medication. Ask your care team. Also, ask how many glasses of fluid you need to drink a day. You must not get dehydrated. You may get drowsy or dizzy. Do not drive, use machinery, or do anything that needs mental alertness until you know how this medication affects you. Do not stand or sit up quickly, especially if you  are an older patient. This reduces the risk of dizzy or fainting spells. Alcohol may interfere with the effect of this medication. Avoid or limit alcoholic drinks. Talk to your care team about your risk of skin cancer. You may be more at risk for skin cancer if you take this medication. This medication can make you more sensitive to the sun. Keep out of the sun. If you cannot avoid being in the sun, wear protective clothing and use sunscreen. Do not use sun lamps or tanning beds/booths. What side effects may I notice from receiving this medication? Side effects that you should report to your care team as soon as possible: Allergic reactions--skin rash, itching, hives, swelling of the face, lips, tongue, or throat Dehydration--increased thirst, dry mouth, feeling  faint or lightheaded, headache, dark yellow or brown urine Gout--severe pain, redness, warmth, or swelling in joints such as the big toe High potassium level--muscle weakness, fast or irregular heartbeat Kidney injury--decrease in the amount of urine, swelling of the ankles, hands, or feet Kidney stones--blood in the urine, pain or trouble passing urine, pain in the lower back or sides Low blood pressure--dizziness, feeling faint or lightheaded, blurry vision Sudden eye pain or change in vision, such as blurry vision, seeing halos around lights, vision loss Side effects that usually do not require medical attention (report to your care team if they continue or are bothersome): Change in sex drive or performance Headache Upset stomach This list may not describe all possible side effects. Call your doctor for medical advice about side effects. You may report side effects to FDA at 1-800-FDA-1088. Where should I keep my medication? Keep out of the reach of children and pets. Store at room temperature between 20 and 25 degrees C (68 and 77 degrees F). Protect from light. Throw away any unused medication after the expiration date. NOTE: This sheet is a summary. It may not cover all possible information. If you have questions about this medicine, talk to your doctor, pharmacist, or health care provider.  2023 Elsevier/[employer] Standard (2020-11-20 00:00:00)

## 2021-07-23 NOTE — Telephone Encounter (Signed)
Pt is in agreement with vit D and b12 injections. Schedule message sent for injections weekly x4 then monthly for 2 months, and lab f/u in 3 mos. Also sent in an Rx for ergocalciferol 1.25 mg weekly for 3 mos per NP. Pt has an overall understanding and knows to call us with any concerns.

## 2021-07-23 NOTE — Progress Notes (Signed)
[facility] Follow up:    Brooke Beals, NP [place] [address]   DIAGNOSIS:  Cancer Staging  Malignant neoplasm of upper-inner quadrant of right breast in female, estrogen receptor positive ([place]) Staging form: Breast, AJCC 7th Edition - Clinical stage from 02/25/2015: Stage IIA (T2, N0, M0) - Unsigned Staged by: Pathologist and managing physician Laterality: Right Estrogen receptor status: Positive Progesterone receptor status: Positive HER2 status: Negative Stage used in treatment planning: Yes National guidelines used in treatment planning: Yes Type of national guideline used in treatment planning: NCCN - Pathologic stage from 04/06/2015: Stage IA (T1c, N0, cM0) - Unsigned Laterality: Right   SUMMARY OF ONCOLOGIC HISTORY: [place] woman s/p biopsy 02/17/2015 of 2 places in her right breast upper inner quadrant, both showing invasive ductal carcinoma, clinically T2 NX, stage II, estrogen receptor and progesterone receptor strongly positive, HER-2/neu nonamplified, with an MIB-1 of 25%   (1) genetics testing 03/06/2015 through theBreast/Ovarian Cancer Panel offered by[facility] Laboratoriesfound no deleterious mutations in ATM, BARD1, BRCA1, BRCA2, BRIP1, CDH1, CHEK2, FANCC, MLH1, MSH2, MSH6, NBN, PALB2, PMS2, PTEN, RAD51C, RAD51D, TP53, and XRCC2.  This panel also includes deletion/duplication analysis (without sequencing) for one gene, EPCAM.               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      (2) Oncotype DX score of 5 predicts an outside the breast risk of recurrence within 10 years of 5% if the patient's only systemic therapy is tamoxifen for 5 years. It also predicts no benefit from chemotherapy   (3) left breast biopsy 03/10/2015 shows an area of atypical ductal hyperplasia             (a) left lumpectomy for 03/23/2015 showed only usual  ductal hyperplasia, no atypia or tumor   (4) right mastectomy with sentinel lymph node sampling 04/06/2015 shows an mpT1C pN0, stage IA invasive ductal carcinoma, grade 2 with negative margins, repeat HER-2 (2) again negative             (a) status post right saline implant placement and left reduction mammoplasty 08/03/2015 with benign pathology   (5) adjuvant radiation not indicated   (6) tamoxifen started neoadjuvantly on 02/25/2015, completing 5 years December 2021  CURRENT THERAPY:   INTERVAL HISTORY: Brooke [name] 56 y.o. female returns for f/u of some concerns she has about her body.    Her most recent left breast screening mammogram was completed on 02/04/2021 and showed no mammographic evidence of malignancy and breast density category B.   She has been experience incontinence of stool in her sleep.  She says this happened about once a month since her last visit. She says that she is urinating frequently at night and is fatigued.  She says the fatigue and memory changes have been occurring for about 3 years.    She is urinating frequently.  She was started on HCTZ on 06/21/2021 by her PCP.  She says that sometimes she will take two pills.    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    Patient Active Problem List   Diagnosis Date Noted   Major depressive disorder, recurrent episode, mild ([place]) 06/26/2019   GAD (generalized anxiety disorder) 06/26/2019  Alcohol use disorder, mild, abuse 06/26/2019   Genetic testing 03/09/2015   Malignant neoplasm of upper-inner quadrant of right breast in female, estrogen receptor positive ([place]) 02/18/2015   Chronic headaches     has No Known Allergies.  MEDICAL HISTORY: Past Medical History:  Diagnosis Date   Alcohol use disorder, mild, abuse 06/26/2019   Anemia    Anxiety    Breast cancer  ([place])    Breast cancer of upper-inner quadrant of right female breast ([place]) 02/18/2015   Chronic headaches    Chronic headaches    Depression    Fatigue    Hot flashes    Yeast infection     SURGICAL HISTORY: Past Surgical History:  Procedure Laterality Date   BREAST LUMPECTOMY Left 2017   BREAST LUMPECTOMY WITH RADIOACTIVE SEED LOCALIZATION Left 04/06/2015   BREAST LUMPECTOMY WITH RADIOACTIVE SEED LOCALIZATION Left 04/06/2015   Procedure: LEFT BREAST LUMPECTOMY WITH RADIOACTIVE SEED LOCALIZATION;  Surgeon: Alphonsa Overall, MD;  Location: [facility];  Service: General;  Laterality: Left;   BREAST RECONSTRUCTION WITH PLACEMENT OF TISSUE EXPANDER AND FLEX HD (ACELLULAR HYDRATED DERMIS) Right 04/06/2015   Procedure: RIGHT BREAST RECONSTRUCTION WITH PLACEMENT OF TISSUE EXPANDER, POSSIBLE ACELLULAR DERMIS;  Surgeon: Irene Limbo, MD;  Location: [facility];  Service: Plastics;  Laterality: Right;   BREAST REDUCTION SURGERY Left 08/03/2015   Procedure: LEFT BREAST REDUCTION FOR SYMMETRY  (BREAST);  Surgeon: Irene Limbo, MD;  Location: [facility];  Service: Plastics;  Laterality: Left;   CHOLECYSTECTOMY     GASTRIC BYPASS     LIPOSUCTION WITH LIPOFILLING N/A 08/03/2015   Procedure: LIPOSUCTION WITH LIPOFILLING ;  Surgeon: Irene Limbo, MD;  Location: [facility];  Service: Plastics;  Laterality: N/A;   MASTECTOMY Right 2017   MASTECTOMY W/ SENTINEL NODE BIOPSY Right    MASTECTOMY W/ SENTINEL NODE BIOPSY Right 04/06/2015   Procedure: RIGHT MASTECTOMY WITH SENTINEL LYMPH NODE BIOPSY;  Surgeon: Alphonsa Overall, MD;  Location: [facility];  Service: General;  Laterality: Right;   REDUCTION MAMMAPLASTY Left 2017   REMOVAL OF BILATERAL TISSUE EXPANDERS WITH PLACEMENT OF BILATERAL BREAST IMPLANTS Right 08/03/2015   Procedure: REMOVAL OF RIGHT TISSUE EXPANDERS WITH PLACEMENT OF IMPLANT;  Surgeon: Irene Limbo, MD;  Location: [facility];  Service: Plastics;  Laterality:  Right;   TUBAL LIGATION      SOCIAL HISTORY: Social History   Socioeconomic History   Marital status: Married    Spouse name: Not on file   Number of children: 2   Years of education: Not on file   Highest education level: Not on file  Occupational History   Not on file  Tobacco Use   Smoking status: Never   Smokeless tobacco: Never  Substance and Sexual Activity   Alcohol use: Yes    Alcohol/week: 4.0 - 5.0 standard drinks of alcohol    Types: 4 - 5 Standard drinks or equivalent per week    Comment: wine and beer on weekends   Drug use: No   Sexual activity: Yes    Birth control/protection: Surgical  Other Topics Concern   Not on file  Social History Narrative   Not on file   Social Determinants of Health   Financial Resource Strain: Not on file  Food Insecurity: Not on file  Transportation Needs: Not on file  Physical Activity: Not on file  Stress: Not on file  Social Connections: Not on file  Intimate Partner Violence: Not on file  FAMILY HISTORY: Family History  Problem Relation Age of Onset   Diabetes Mother    Hypertension Mother    Spinal muscular atrophy Mother    Irregular heart beat Mother    Anxiety disorder Mother    Arthritis Mother    Cancer Other        maternal great grandfather dx. unspecified type cancer   Heart attack Maternal Grandmother    Heart attack Maternal Uncle    Cancer Cousin        paternal 1st cousin dx. with cancer that was typically a childhood cancer   Breast cancer Neg Hx     Review of Systems  Constitutional:  Negative for appetite change, chills, fatigue, fever and unexpected weight change.  HENT:   Negative for hearing loss, lump/mass and trouble swallowing.   Eyes:  Negative for eye problems and icterus.  Respiratory:  Negative for chest tightness, cough and shortness of breath.   Cardiovascular:  Negative for chest pain, leg swelling and palpitations.  Gastrointestinal:  Negative for abdominal distention,  abdominal pain, constipation, diarrhea, nausea and vomiting.  Endocrine: Negative for hot flashes.  Genitourinary:  Negative for difficulty urinating.   Musculoskeletal:  Negative for arthralgias.  Skin:  Negative for itching and rash.  Neurological:  Negative for dizziness, extremity weakness, headaches and numbness.  Hematological:  Negative for adenopathy. Does not bruise/bleed easily.  Psychiatric/Behavioral:  Negative for depression. The patient is not nervous/anxious.       PHYSICAL EXAMINATION  ECOG PERFORMANCE STATUS: 1 - Symptomatic but completely ambulatory  Vitals:   07/23/21 0818 07/23/21 0919  BP: (!) 154/84 134/82  Pulse: 66   Temp: 97.7 F (36.5 C)   SpO2: 100%     Physical Exam Constitutional:      General: She is not in acute distress.    Appearance: Normal appearance. She is not toxic-appearing.  HENT:     Head: Normocephalic and atraumatic.  Eyes:     General: No scleral icterus. Cardiovascular:     Rate and Rhythm: Normal rate and regular rhythm.     Pulses: Normal pulses.     Heart sounds: Normal heart sounds.  Pulmonary:     Effort: Pulmonary effort is normal.     Breath sounds: Normal breath sounds.  Abdominal:     General: Abdomen is flat. Bowel sounds are normal. There is no distension.     Palpations: Abdomen is soft.     Tenderness: There is no abdominal tenderness.  Musculoskeletal:        General: No swelling.     Cervical back: Neck supple.  Lymphadenopathy:     Cervical: No cervical adenopathy.  Skin:    General: Skin is warm and dry.     Findings: No rash.  Neurological:     General: No focal deficit present.     Mental Status: She is alert.  Psychiatric:        Mood and Affect: Mood normal.        Behavior: Behavior normal.     LABORATORY DATA:  CBC    Component Value Date/Time   WBC 5.5 07/20/2021 1557   WBC 6.0 12/02/2020 1240   RBC 3.42 (L) 07/20/2021 1557   HGB 10.7 (L) 07/20/2021 1557   HGB 12.2 06/22/2016 0858    HCT 32.7 (L) 07/20/2021 1557   HCT 37.5 06/22/2016 0858   PLT 294 07/20/2021 1557   PLT 305 07/20/2021 1557   PLT 305 06/22/2016 0858   MCV  95.6 07/20/2021 1557   MCV 93.8 06/22/2016 0858   MCH 31.3 07/20/2021 1557   MCHC 32.7 07/20/2021 1557   RDW 14.6 07/20/2021 1557   RDW 14.4 06/22/2016 0858   LYMPHSABS 2.5 07/20/2021 1557   LYMPHSABS 2.0 06/22/2016 0858   MONOABS 0.5 07/20/2021 1557   MONOABS 0.3 06/22/2016 0858   EOSABS 0.3 07/20/2021 1557   EOSABS 0.2 06/22/2016 0858   BASOSABS 0.1 07/20/2021 1557   BASOSABS 0.1 06/22/2016 0858    CMP     Component Value Date/Time   NA 139 07/20/2021 1557   NA 141 06/22/2016 0858   K 4.1 07/20/2021 1557   K 4.4 06/22/2016 0858   CL 107 07/20/2021 1557   CO2 24 07/20/2021 1557   CO2 22 06/22/2016 0858   GLUCOSE 105 (H) 07/20/2021 1557   GLUCOSE 110 06/22/2016 0858   BUN 14 07/20/2021 1557   BUN 11.8 06/22/2016 0858   CREATININE 0.70 07/20/2021 1557   CREATININE 0.8 06/22/2016 0858   CALCIUM 8.9 07/20/2021 1557   CALCIUM 9.1 06/22/2016 0858   PROT 7.1 07/20/2021 1557   PROT 7.1 06/22/2016 0858   ALBUMIN 4.0 07/20/2021 1557   ALBUMIN 3.5 06/22/2016 0858   AST 25 07/20/2021 1557   AST 48 (H) 06/22/2016 0858   ALT 24 07/20/2021 1557   ALT 35 06/22/2016 0858   ALKPHOS 90 07/20/2021 1557   ALKPHOS 109 06/22/2016 0858   BILITOT 0.3 07/20/2021 1557   BILITOT 0.78 06/22/2016 0858   GFRNONAA >60 07/20/2021 1557   GFRNONAA >89 03/14/2014 1354   GFRAA >60 05/23/2018 0912   GFRAA >89 03/14/2014 1354        ASSESSMENT and THERAPY PLAN:   Malignant neoplasm of upper-inner quadrant of right breast in female, estrogen receptor positive ([place]) Natallia is a 56 year old woman with history of right-sided stage Ia breast cancer status post treatment.  She is normally seen in our survivorship program however ever she does not have a primary care provider and has some anemia today.  Due to her symptoms with anemia, stool changes,  difficulty clearing her throat, and hypertension I recommended the following.  1.  I recommended that she start Maxzide 1 tablet daily.  I sent this to her local pharmacy.  I also gave her information about blood pressure, self monitoring at home with a blood pressure cuff, and information about the medication and reviewed this with her and her after visit summary.  2.  Anemia: This is normocytic anemia.  I sent off additional lab testing and we will evaluate this further.  She does not have any reason to have a slightly elevated D-dimer, she is asymptomatic.  I would recommend following up with primary care about this which I referred her to today.  3.  Stool changes: She has never undergone colonoscopy.  I placed a referral to GI today.  4. Globus sensation: The globus sensation she is experiencing is likely related to reflux.  I recommended GI evaluation for possible upper endoscopy.  I sent in omeprazole for her to take daily to see if it helps.  Idali really needs a primary care provider to follow-up with her on her health maintenance issues and her hypertension.  I placed a referral for her to go to [facility] primary care.  I gave her 30-day supply of the above medications to get her until she can get in with primary care.  I recommended she follow-up with our office in 6 months for her regular cancer  surveillance visit.   All questions were answered. The patient knows to call the clinic with any problems, questions or concerns. We can certainly see the patient much sooner if necessary.  Total encounter time:40 minutes*in face-to-face visit time, chart review, lab review, care coordination, order entry, and documentation of the encounter time.    Wilber Bihari, NP 07/23/21 9:19 AM Medical Oncology and Hematology [facility] [place], [place] [address] Tel. [phone]    Fax. [phone]  *Total Encounter Time as defined by the Centers for Medicare and  Medicaid Services includes, in addition to the face-to-face time of a patient visit (documented in the note above) non-face-to-face time: obtaining and reviewing outside history, ordering and reviewing medications, tests or procedures, care coordination (communications with other health care professionals or caregivers) and documentation in the medical record.

## 2021-07-24 ENCOUNTER — Other Ambulatory Visit: Payer: Self-pay | Admitting: Adult Health

## 2021-07-24 DIAGNOSIS — E538 Deficiency of other specified B group vitamins: Secondary | ICD-10-CM | POA: Insufficient documentation

## 2021-07-26 LAB — HGB FRACTIONATION CASCADE
Hgb A2: 2.4 % (ref 1.8–3.2)
Hgb A: 97.6 % (ref 96.4–98.8)
Hgb F: 0 % (ref 0.0–2.0)
Hgb S: 0 %

## 2021-07-27 ENCOUNTER — Telehealth: Payer: Self-pay | Admitting: Adult Health

## 2021-07-27 ENCOUNTER — Telehealth: Payer: Self-pay | Admitting: Hematology and Oncology

## 2021-07-27 NOTE — Telephone Encounter (Signed)
Called pt per 7/21 inbasket, pt aware of appts. Pt requested to only schedule through august, informed pt of need to stop by scheduling for next appts, desk nurse notified

## 2021-07-27 NOTE — Telephone Encounter (Signed)
.  Called patient to schedule appointment per 7/21 inbasket, patient is aware of date and time.

## 2021-07-28 ENCOUNTER — Telehealth: Payer: Self-pay

## 2021-07-28 NOTE — Telephone Encounter (Signed)
error 

## 2021-07-30 ENCOUNTER — Inpatient Hospital Stay: Payer: BC Managed Care – PPO

## 2021-07-30 ENCOUNTER — Other Ambulatory Visit: Payer: Self-pay

## 2021-07-30 DIAGNOSIS — E538 Deficiency of other specified B group vitamins: Secondary | ICD-10-CM | POA: Diagnosis not present

## 2021-07-30 DIAGNOSIS — D649 Anemia, unspecified: Secondary | ICD-10-CM | POA: Diagnosis not present

## 2021-07-30 MED ORDER — CYANOCOBALAMIN 1000 MCG/ML IJ SOLN
1000.0000 ug | Freq: Once | INTRAMUSCULAR | Status: AC
  Administered 2021-07-30: 1000 ug via INTRAMUSCULAR
  Filled 2021-07-30: qty 1

## 2021-08-05 ENCOUNTER — Other Ambulatory Visit: Payer: Self-pay

## 2021-08-05 ENCOUNTER — Inpatient Hospital Stay: Payer: BC Managed Care – PPO | Attending: Adult Health

## 2021-08-05 VITALS — BP 149/95 | HR 79 | Resp 16

## 2021-08-05 DIAGNOSIS — E538 Deficiency of other specified B group vitamins: Secondary | ICD-10-CM | POA: Diagnosis present

## 2021-08-05 MED ORDER — CYANOCOBALAMIN 1000 MCG/ML IJ SOLN
1000.0000 ug | Freq: Once | INTRAMUSCULAR | Status: AC
  Administered 2021-08-05: 1000 ug via INTRAMUSCULAR
  Filled 2021-08-05: qty 1

## 2021-08-17 ENCOUNTER — Other Ambulatory Visit: Payer: Self-pay | Admitting: Adult Health

## 2021-08-17 DIAGNOSIS — I10 Essential (primary) hypertension: Secondary | ICD-10-CM

## 2021-08-17 DIAGNOSIS — R0989 Other specified symptoms and signs involving the circulatory and respiratory systems: Secondary | ICD-10-CM

## 2021-08-18 ENCOUNTER — Inpatient Hospital Stay: Payer: BC Managed Care – PPO

## 2021-08-18 ENCOUNTER — Other Ambulatory Visit: Payer: Self-pay

## 2021-08-18 DIAGNOSIS — E538 Deficiency of other specified B group vitamins: Secondary | ICD-10-CM | POA: Diagnosis not present

## 2021-08-18 MED ORDER — CYANOCOBALAMIN 1000 MCG/ML IJ SOLN
1000.0000 ug | Freq: Once | INTRAMUSCULAR | Status: AC
  Administered 2021-08-18: 1000 ug via INTRAMUSCULAR
  Filled 2021-08-18: qty 1

## 2021-08-24 ENCOUNTER — Ambulatory Visit: Payer: BC Managed Care – PPO | Admitting: Obstetrics and Gynecology

## 2021-08-25 ENCOUNTER — Ambulatory Visit (INDEPENDENT_AMBULATORY_CARE_PROVIDER_SITE_OTHER): Payer: BC Managed Care – PPO | Admitting: Student

## 2021-08-25 ENCOUNTER — Encounter: Payer: Self-pay | Admitting: Adult Health

## 2021-08-25 ENCOUNTER — Encounter: Payer: Self-pay | Admitting: Family Medicine

## 2021-08-25 ENCOUNTER — Other Ambulatory Visit (HOSPITAL_COMMUNITY)
Admission: RE | Admit: 2021-08-25 | Discharge: 2021-08-25 | Disposition: A | Discharge status: Home / Self Care | Payer: BC Managed Care – PPO | Source: Ambulatory Visit | Attending: Obstetrics and Gynecology | Admitting: Obstetrics and Gynecology

## 2021-08-25 VITALS — BP 140/91 | HR 72 | Ht 71.0 in | Wt 237.0 lb

## 2021-08-25 DIAGNOSIS — N952 Postmenopausal atrophic vaginitis: Secondary | ICD-10-CM | POA: Diagnosis not present

## 2021-08-25 DIAGNOSIS — Z01419 Encounter for gynecological examination (general) (routine) without abnormal findings: Secondary | ICD-10-CM | POA: Insufficient documentation

## 2021-08-25 MED ORDER — PREMARIN 0.625 MG/GM VA CREA: 1.0000 | TOPICAL_CREAM | VAGINAL | 12 refills | Status: DC

## 2021-08-25 NOTE — Progress Notes (Signed)
ANNUAL EXAM Patient name: Brooke [name] MRN [id number]  Date of birth: 03-24-1965 Chief Complaint:   Gynecologic Exam  History of Present Illness:   Brooke [name] is a 56 y.o. [id number] African-American female being seen today for a routine annual exam.  Current complaints: Vaginal dryness  Patient's last menstrual period was 04/05/2016 (approximate).   The pregnancy intention screening data noted above was reviewed. Potential methods of contraception were discussed. The patient elected to proceed with No data recorded.   Last pap 2019. Results were: NILM w/ HRHPV negative. H/O abnormal pap: no Last mammogram: 02/2021. Results were: normal. H/O Right-sided Breast Cancer (2017)  Last colonoscopy: 2011. Results were: normal. Family h/o colorectal cancer: no     08/25/2021    4:04 PM 10/22/2018   10:40 AM 01/26/2017    2:41 PM 09/15/2016    8:41 AM 08/31/2016    3:17 PM  Depression screen PHQ 2/9  Decreased Interest 0 0 0 0 0  Down, Depressed, Hopeless 0 0 0 0 0  PHQ - 2 Score 0 0 0 0 0  Altered sleeping 2      Tired, decreased energy 2      Change in appetite 1      Feeling bad or failure about yourself  0      Trouble concentrating 0      Moving slowly or fidgety/restless 0      Suicidal thoughts 0      PHQ-9 Score 5      Difficult doing work/chores Not difficult at all             No data to display           Review of Systems:   Pertinent items are noted in HPI Denies any headaches, blurred vision, fatigue, shortness of breath, chest pain, abdominal pain, abnormal vaginal discharge/itching/odor/irritation, problems with periods, bowel movements, urination, or intercourse unless otherwise stated above. Pertinent History Reviewed:  Reviewed past medical,surgical, social and family history.  Reviewed problem list, medications and allergies. Physical Assessment:   Vitals:   08/25/21 1601  BP: (!) 140/91  Pulse: 72  Weight: 237 lb (107.5 kg)  Height: '5\' 11"'$  (1.803 m)   Body mass index is 33.05 kg/m.        Physical Examination:   General appearance - well appearing, and in no distress  Mental status - alert, oriented to person, place, and time  Psych:  She has a normal mood and affect  Skin - warm and dry, normal color, no suspicious lesions noted  Chest - effort normal, all lung fields clear to auscultation bilaterally  Heart - normal rate and regular rhythm  Neck:  midline trachea, no thyromegaly or nodules  Breasts - breasts appear normal, no suspicious masses, no skin or nipple changes or  axillary nodes  Abdomen - soft, nontender, nondistended, no masses or organomegaly  Pelvic - VULVA: normal appearing vulva with no masses, tenderness or lesions  VAGINA: normal appearing vagina with moderate atrophy, No discharge, no lesions  CERVIX: normal appearing cervix without discharge or lesions, no CMT  Thin prep pap is done  HR HPV cotesting  UTERUS: uterus is felt to be normal size, shape, consistency and nontender   ADNEXA: No adnexal masses or tenderness noted.  Rectal - normal rectal, good sphincter tone, no masses felt.   Extremities:  No swelling or varicosities noted  Chaperone present for exam  No results found for this or any previous  visit (from the past 24 hour(s)).  Assessment & Plan:  1. Women's annual routine gynecological examination  - Cytology - PAP( [facility])  2. Vaginal atrophy - Consulted Dr. Trina Ao for management plan - conjugated estrogens (PREMARIN) vaginal cream; Place 1 Applicatorful vaginally 2 (two) times a week.  Dispense: 42.5 g; Refill: 12   Labs/procedures today: see note  Mammogram: in 1 year, or sooner if problems Colonoscopy: schedule screening colonoscopy as soon as possible, or sooner if problems. Oncology team is managing  No orders of the defined types were placed in this encounter.   Meds:  Meds ordered this encounter  Medications   conjugated estrogens (PREMARIN) vaginal cream    Sig: Place 1  Applicatorful vaginally 2 (two) times a week.    Dispense:  42.5 g    Refill:  12    Order Specific Question:   Supervising Provider    Answer:   Donnamae Jude [[id number]]    Follow-up: No follow-ups on file.  Johnston Ebbs, NP 08/25/2021 5:20 PM

## 2021-08-25 NOTE — Progress Notes (Signed)
56 y.o GYN presents for AEX/PAP.  C/o vaginal dryness.  Last Mammogram was 02/04/2021.

## 2021-08-27 ENCOUNTER — Inpatient Hospital Stay: Payer: BC Managed Care – PPO

## 2021-08-27 ENCOUNTER — Other Ambulatory Visit: Payer: Self-pay

## 2021-08-27 DIAGNOSIS — E538 Deficiency of other specified B group vitamins: Secondary | ICD-10-CM

## 2021-08-27 MED ORDER — CYANOCOBALAMIN 1000 MCG/ML IJ SOLN
1000.0000 ug | Freq: Once | INTRAMUSCULAR | Status: AC
  Administered 2021-08-27: 1000 ug via INTRAMUSCULAR
  Filled 2021-08-27: qty 1

## 2021-08-31 LAB — CYTOLOGY - PAP
Adequacy: ABSENT
Comment: NEGATIVE
Diagnosis: NEGATIVE
High risk HPV: NEGATIVE

## 2021-09-13 ENCOUNTER — Other Ambulatory Visit: Payer: Self-pay | Admitting: Adult Health

## 2021-09-13 DIAGNOSIS — I10 Essential (primary) hypertension: Secondary | ICD-10-CM

## 2021-09-22 ENCOUNTER — Telehealth: Payer: Self-pay | Admitting: Adult Health

## 2021-09-22 NOTE — Telephone Encounter (Signed)
Rescheduled appointment per provider PAL. Patient is aware of the changes made to her upcoming appointments. 

## 2021-09-26 ENCOUNTER — Other Ambulatory Visit: Payer: Self-pay | Admitting: Adult Health

## 2021-09-27 ENCOUNTER — Other Ambulatory Visit: Payer: Self-pay

## 2021-09-27 ENCOUNTER — Other Ambulatory Visit: Payer: Self-pay | Admitting: *Deleted

## 2021-09-27 DIAGNOSIS — C50211 Malignant neoplasm of upper-inner quadrant of right female breast: Secondary | ICD-10-CM

## 2021-10-16 ENCOUNTER — Other Ambulatory Visit: Payer: Self-pay | Admitting: Adult Health

## 2021-10-16 DIAGNOSIS — I10 Essential (primary) hypertension: Secondary | ICD-10-CM

## 2021-10-17 ENCOUNTER — Encounter: Payer: Self-pay | Admitting: Adult Health

## 2021-10-22 ENCOUNTER — Other Ambulatory Visit: Payer: Self-pay | Admitting: *Deleted

## 2021-10-22 ENCOUNTER — Other Ambulatory Visit: Payer: BC Managed Care – PPO

## 2021-10-22 ENCOUNTER — Ambulatory Visit: Payer: BC Managed Care – PPO | Admitting: Adult Health

## 2021-10-22 ENCOUNTER — Ambulatory Visit: Payer: BC Managed Care – PPO

## 2021-10-22 DIAGNOSIS — Z170 Estrogen receptor positive status [ER+]: Secondary | ICD-10-CM

## 2021-10-22 DIAGNOSIS — E538 Deficiency of other specified B group vitamins: Secondary | ICD-10-CM

## 2021-10-25 ENCOUNTER — Ambulatory Visit: Payer: BC Managed Care – PPO

## 2021-10-25 ENCOUNTER — Ambulatory Visit: Payer: BC Managed Care – PPO | Admitting: Adult Health

## 2021-10-25 ENCOUNTER — Other Ambulatory Visit: Payer: BC Managed Care – PPO

## 2021-10-25 NOTE — Progress Notes (Signed)
[facility] Follow up:    Brooke Beals, NP [place] [address]   DIAGNOSIS:  Cancer Staging  Malignant neoplasm of upper-inner quadrant of right breast in female, estrogen receptor positive ([place]) Staging form: Breast, AJCC 7th Edition - Clinical stage from 02/25/2015: Stage IIA (T2, N0, M0) - Unsigned Staged by: Pathologist and managing physician Laterality: Right Estrogen receptor status: Positive Progesterone receptor status: Positive HER2 status: Negative Stage used in treatment planning: Yes National guidelines used in treatment planning: Yes Type of national guideline used in treatment planning: NCCN - Pathologic stage from 04/06/2015: Stage IA (T1c, N0, cM0) - Unsigned Laterality: Right   SUMMARY OF ONCOLOGIC HISTORY: Oncology History  Malignant neoplasm of upper-inner quadrant of right breast in female, estrogen receptor positive ([place])  02/12/2015 Mammogram   Right mammogram: UIQ of the right breast, anterior depth, spiculated mass with pleomorphic calcifications measuring ~1.5 cm mammographically. Approximately 5.5 cm posterior to this mass, there is a more subtle area of density with pleomorphic calcs   02/17/2015 Initial Biopsy   Right breast core needle bx: 2 lesions (separated by 10 cm): invasive ductal carcinoma, ER+, PR+, Ki67 25%, HER2/neu negative   02/17/2015 Clinical Stage   Stage II: T2 Nx   02/25/2015 -  Neo-Adjuvant Anti-estrogen oral therapy   Tamoxifen 20 mg began; held 05/2015 due to intolerable side effects   03/06/2015 Procedure   Breast/ Ovarian cancer panel ([facility]): no deleterious mutations at ATM, BARD1, BRCA1, BRCA2, BRIP1, CDH1, CHEK2, FANCC, MLH1, MSH2, MSH6, NBN, PALB2, PMS2, PTEN, RAD51C, RAD51D, TP53, and XRCC2.  VUS called "c.662T>C (p.Ile221Thr)" found XRCC2.   03/10/2015 Procedure   Left breast bx: ADH   03/23/2015 Surgery   Left lumpectomy: ductal hyperplasia   04/06/2015 Definitive Surgery    Right mastectomy/SLNB: invasive ductal carcinoma, grade 2, negative margins, repeat HER2/neu negative   04/06/2015 Pathologic Stage   Stage IA: pT1c N0   04/06/2015 Oncotype testing   RS 5 (5% ROR)   06/22/2015 Survivorship   SCP mailed to patient at her request in lieu of in person visit     CURRENT THERAPY: B12 injections  INTERVAL HISTORY: Brooke [name] 56 y.o. female returns for follow-up of her history of breast cancer.  She is status post right mastectomy and her most recent mammogram was completed on February 04, 2021.  It showed no mammographic evidence of malignancy and breast density category B.  At her last visit she was experiencing increased fatigue and we ran some lab work which revealed vitamin D and B12 deficiency.  I also prescribed vitamin D replacement and started her on B12 injections.  She is tolerating these injections well.  She still has increased anxiety and depression and did not receive a call from the internal medicine referral that was placed.  Also referred her for colonoscopy and per the referral notes it appears she was called and they left her a voicemail.  She tells me that she has not checked her voicemail and plans to follow-up with the GI referral to get that scheduled for colonoscopy.   Patient Active Problem List   Diagnosis Date Noted   B12 deficiency 07/24/2021   Major depressive disorder, recurrent episode, mild ([place]) 06/26/2019   GAD (generalized anxiety disorder) 06/26/2019   Alcohol use disorder, mild, abuse 06/26/2019   Genetic testing 03/09/2015   Malignant neoplasm of upper-inner quadrant of right breast in female, estrogen receptor positive ([place]) 02/18/2015   Chronic headaches  has No Known Allergies.  MEDICAL HISTORY: Past Medical History:  Diagnosis Date   Alcohol use disorder, mild, abuse 06/26/2019   Anemia    Anxiety    Breast cancer ([place])    Breast cancer of upper-inner quadrant of right female breast ([place]) 02/18/2015    Chronic headaches    Chronic headaches    Depression    Fatigue    Hot flashes    Yeast infection     SURGICAL HISTORY: Past Surgical History:  Procedure Laterality Date   BREAST LUMPECTOMY Left 2017   BREAST LUMPECTOMY WITH RADIOACTIVE SEED LOCALIZATION Left 04/06/2015   BREAST LUMPECTOMY WITH RADIOACTIVE SEED LOCALIZATION Left 04/06/2015   Procedure: LEFT BREAST LUMPECTOMY WITH RADIOACTIVE SEED LOCALIZATION;  Surgeon: Alphonsa Overall, MD;  Location: [facility];  Service: General;  Laterality: Left;   BREAST RECONSTRUCTION WITH PLACEMENT OF TISSUE EXPANDER AND FLEX HD (ACELLULAR HYDRATED DERMIS) Right 04/06/2015   Procedure: RIGHT BREAST RECONSTRUCTION WITH PLACEMENT OF TISSUE EXPANDER, POSSIBLE ACELLULAR DERMIS;  Surgeon: Irene Limbo, MD;  Location: [facility];  Service: Plastics;  Laterality: Right;   BREAST REDUCTION SURGERY Left 08/03/2015   Procedure: LEFT BREAST REDUCTION FOR SYMMETRY  (BREAST);  Surgeon: Irene Limbo, MD;  Location: [facility];  Service: Plastics;  Laterality: Left;   CHOLECYSTECTOMY     GASTRIC BYPASS     LIPOSUCTION WITH LIPOFILLING N/A 08/03/2015   Procedure: LIPOSUCTION WITH LIPOFILLING ;  Surgeon: Irene Limbo, MD;  Location: [facility];  Service: Plastics;  Laterality: N/A;   MASTECTOMY Right 2017   MASTECTOMY W/ SENTINEL NODE BIOPSY Right    MASTECTOMY W/ SENTINEL NODE BIOPSY Right 04/06/2015   Procedure: RIGHT MASTECTOMY WITH SENTINEL LYMPH NODE BIOPSY;  Surgeon: Alphonsa Overall, MD;  Location: [facility];  Service: General;  Laterality: Right;   REDUCTION MAMMAPLASTY Left 2017   REMOVAL OF BILATERAL TISSUE EXPANDERS WITH PLACEMENT OF BILATERAL BREAST IMPLANTS Right 08/03/2015   Procedure: REMOVAL OF RIGHT TISSUE EXPANDERS WITH PLACEMENT OF IMPLANT;  Surgeon: Irene Limbo, MD;  Location: [facility];  Service: Plastics;  Laterality: Right;   TUBAL LIGATION      SOCIAL HISTORY: Social History   Socioeconomic History    Marital status: Married    Spouse name: Not on file   Number of children: 2   Years of education: Not on file   Highest education level: Not on file  Occupational History   Not on file  Tobacco Use   Smoking status: Never   Smokeless tobacco: Never  Substance and Sexual Activity   Alcohol use: Yes    Alcohol/week: 4.0 - 5.0 standard drinks of alcohol    Types: 4 - 5 Standard drinks or equivalent per week    Comment: wine and beer on weekends   Drug use: No   Sexual activity: Yes    Birth control/protection: Surgical  Other Topics Concern   Not on file  Social History Narrative   Not on file   Social Determinants of Health   Financial Resource Strain: Not on file  Food Insecurity: Not on file  Transportation Needs: Not on file  Physical Activity: Not on file  Stress: Not on file  Social Connections: Not on file  Intimate Partner Violence: Not on file    FAMILY HISTORY: Family History  Problem Relation Age of Onset   Diabetes Mother    Hypertension Mother    Spinal muscular atrophy Mother    Irregular heart beat Mother  Anxiety disorder Mother    Arthritis Mother    Cancer Other        maternal great grandfather dx. unspecified type cancer   Heart attack Maternal Grandmother    Heart attack Maternal Uncle    Cancer Cousin        paternal 1st cousin dx. with cancer that was typically a childhood cancer   Breast cancer Neg Hx     Review of Systems  Constitutional:  Negative for appetite change, chills, fatigue, fever and unexpected weight change.  HENT:   Negative for hearing loss, lump/mass and trouble swallowing.   Eyes:  Negative for eye problems and icterus.  Respiratory:  Negative for chest tightness, cough and shortness of breath.   Cardiovascular:  Negative for chest pain, leg swelling and palpitations.  Gastrointestinal:  Negative for abdominal distention, abdominal pain, constipation, diarrhea, nausea and vomiting.  Endocrine: Negative for hot  flashes.  Genitourinary:  Negative for difficulty urinating.   Musculoskeletal:  Negative for arthralgias.  Skin:  Negative for itching and rash.  Neurological:  Negative for dizziness, extremity weakness, headaches and numbness.  Hematological:  Negative for adenopathy. Does not bruise/bleed easily.  Psychiatric/Behavioral:  Negative for depression. The patient is not nervous/anxious.       PHYSICAL EXAMINATION  ECOG PERFORMANCE STATUS: 1 - Symptomatic but completely ambulatory  Vitals:   10/26/21 0822  BP: (!) 143/95  Pulse: 76  Resp: 18  Temp: 97.9 F (36.6 C)  SpO2: 100%    Physical Exam Constitutional:      General: She is not in acute distress.    Appearance: Normal appearance. She is not toxic-appearing.  HENT:     Head: Normocephalic and atraumatic.  Eyes:     General: No scleral icterus. Cardiovascular:     Rate and Rhythm: Normal rate and regular rhythm.     Pulses: Normal pulses.     Heart sounds: Normal heart sounds.  Pulmonary:     Effort: Pulmonary effort is normal.     Breath sounds: Normal breath sounds.  Abdominal:     General: Abdomen is flat. Bowel sounds are normal. There is no distension.     Palpations: Abdomen is soft.     Tenderness: There is no abdominal tenderness.  Musculoskeletal:        General: No swelling.     Cervical back: Neck supple.  Lymphadenopathy:     Cervical: No cervical adenopathy.  Skin:    General: Skin is warm and dry.     Findings: No rash.  Neurological:     General: No focal deficit present.     Mental Status: She is alert.  Psychiatric:        Mood and Affect: Mood normal.        Behavior: Behavior normal.     LABORATORY DATA:  CBC    Component Value Date/Time   WBC 4.4 10/26/2021 0806   WBC 6.0 12/02/2020 1240   RBC 3.66 (L) 10/26/2021 0806   RBC 3.73 (L) 10/26/2021 0806   HGB 11.8 (L) 10/26/2021 0806   HGB 12.2 06/22/2016 0858   HCT 36.2 10/26/2021 0806   HCT 37.5 06/22/2016 0858   PLT 308  10/26/2021 0806   PLT 305 06/22/2016 0858   MCV 97.1 10/26/2021 0806   MCV 93.8 06/22/2016 0858   MCH 31.6 10/26/2021 0806   MCHC 32.6 10/26/2021 0806   RDW 14.2 10/26/2021 0806   RDW 14.4 06/22/2016 0858   LYMPHSABS 1.6 10/26/2021 0806  LYMPHSABS 2.0 06/22/2016 0858   MONOABS 0.4 10/26/2021 0806   MONOABS 0.3 06/22/2016 0858   EOSABS 0.2 10/26/2021 0806   EOSABS 0.2 06/22/2016 0858   BASOSABS 0.1 10/26/2021 0806   BASOSABS 0.1 06/22/2016 0858    CMP     Component Value Date/Time   NA 141 10/26/2021 0806   NA 141 06/22/2016 0858   K 3.8 10/26/2021 0806   K 4.4 06/22/2016 0858   CL 104 10/26/2021 0806   CO2 28 10/26/2021 0806   CO2 22 06/22/2016 0858   GLUCOSE 107 (H) 10/26/2021 0806   GLUCOSE 110 06/22/2016 0858   BUN 11 10/26/2021 0806   BUN 11.8 06/22/2016 0858   CREATININE 0.91 10/26/2021 0806   CREATININE 0.8 06/22/2016 0858   CALCIUM 9.6 10/26/2021 0806   CALCIUM 9.1 06/22/2016 0858   PROT 7.4 10/26/2021 0806   PROT 7.1 06/22/2016 0858   ALBUMIN 4.3 10/26/2021 0806   ALBUMIN 3.5 06/22/2016 0858   AST 23 10/26/2021 0806   AST 48 (H) 06/22/2016 0858   ALT 19 10/26/2021 0806   ALT 35 06/22/2016 0858   ALKPHOS 81 10/26/2021 0806   ALKPHOS 109 06/22/2016 0858   BILITOT 0.7 10/26/2021 0806   BILITOT 0.78 06/22/2016 0858   GFRNONAA >60 10/26/2021 0806   GFRNONAA >89 03/14/2014 1354   GFRAA >60 05/23/2018 0912   GFRAA >89 03/14/2014 1354         ASSESSMENT and THERAPY PLAN:   Malignant neoplasm of upper-inner quadrant of right breast in female, estrogen receptor positive ([place]) Brooke [name] is a 56 year old woman with history of right-sided stage Ia estrogen positive breast cancer status post right mastectomy and reconstruction along with tamoxifen x5 years completed in 2022.  She is continuing on B12 injections for her B12 deficiency and oral vitamin D.  I sent a message to Venia Carbon who is over her primary care to evaluate what could have happened with the  referral sent over to internal medicine.  In the meantime Carleta and I talked about her anxiety and depression.  I recommended that she go and get evaluated at the behavioral health urgent care and I gave her the address and phone number of this location which she expressed gratitude of.  Will return every 4 weeks to continue on B12 injections and we will see her back January for her long-term survivorship visit.    All questions were answered. The patient knows to call the clinic with any problems, questions or concerns. We can certainly see the patient much sooner if necessary.  Total encounter time:30 minutes*in face-to-face visit time, chart review, lab review, care coordination, order entry, and documentation of the encounter time.    Wilber Bihari, NP 10/26/21 9:11 AM Medical Oncology and Hematology [facility] [place],  [address] Tel. [phone]    Fax. [phone]  *Total Encounter Time as defined by the Centers for Medicare and Medicaid Services includes, in addition to the face-to-face time of a patient visit (documented in the note above) non-face-to-face time: obtaining and reviewing outside history, ordering and reviewing medications, tests or procedures, care coordination (communications with other health care professionals or caregivers) and documentation in the medical record.

## 2021-10-26 ENCOUNTER — Other Ambulatory Visit: Payer: Self-pay

## 2021-10-26 ENCOUNTER — Encounter: Payer: Self-pay | Admitting: Adult Health

## 2021-10-26 ENCOUNTER — Inpatient Hospital Stay (HOSPITAL_BASED_OUTPATIENT_CLINIC_OR_DEPARTMENT_OTHER): Payer: BC Managed Care – PPO | Admitting: Adult Health

## 2021-10-26 ENCOUNTER — Inpatient Hospital Stay: Payer: BC Managed Care – PPO | Attending: Adult Health

## 2021-10-26 ENCOUNTER — Inpatient Hospital Stay: Payer: BC Managed Care – PPO

## 2021-10-26 ENCOUNTER — Telehealth: Payer: Self-pay

## 2021-10-26 VITALS — BP 143/95 | HR 76 | Temp 97.9°F | Resp 18 | Ht 71.0 in | Wt 241.1 lb

## 2021-10-26 DIAGNOSIS — E559 Vitamin D deficiency, unspecified: Secondary | ICD-10-CM | POA: Diagnosis not present

## 2021-10-26 DIAGNOSIS — E538 Deficiency of other specified B group vitamins: Secondary | ICD-10-CM | POA: Diagnosis not present

## 2021-10-26 DIAGNOSIS — Z170 Estrogen receptor positive status [ER+]: Secondary | ICD-10-CM

## 2021-10-26 DIAGNOSIS — C50211 Malignant neoplasm of upper-inner quadrant of right female breast: Secondary | ICD-10-CM | POA: Diagnosis not present

## 2021-10-26 LAB — IRON AND IRON BINDING CAPACITY (CC-WL,HP ONLY)
Iron: 185 ug/dL — ABNORMAL HIGH (ref 28–170)
Saturation Ratios: 38 % — ABNORMAL HIGH (ref 10.4–31.8)
TIBC: 483 ug/dL — ABNORMAL HIGH (ref 250–450)
UIBC: 298 ug/dL (ref 148–442)

## 2021-10-26 LAB — CBC WITH DIFFERENTIAL (CANCER CENTER ONLY)
Abs Immature Granulocytes: 0 10*3/uL (ref 0.00–0.07)
Basophils Absolute: 0.1 10*3/uL (ref 0.0–0.1)
Basophils Relative: 1 %
Eosinophils Absolute: 0.2 10*3/uL (ref 0.0–0.5)
Eosinophils Relative: 5 %
HCT: 36.2 % (ref 36.0–46.0)
Hemoglobin: 11.8 g/dL — ABNORMAL LOW (ref 12.0–15.0)
Immature Granulocytes: 0 %
Lymphocytes Relative: 37 %
Lymphs Abs: 1.6 10*3/uL (ref 0.7–4.0)
MCH: 31.6 pg (ref 26.0–34.0)
MCHC: 32.6 g/dL (ref 30.0–36.0)
MCV: 97.1 fL (ref 80.0–100.0)
Monocytes Absolute: 0.4 10*3/uL (ref 0.1–1.0)
Monocytes Relative: 8 %
Neutro Abs: 2.2 10*3/uL (ref 1.7–7.7)
Neutrophils Relative %: 49 %
Platelet Count: 308 10*3/uL (ref 150–400)
RBC: 3.73 MIL/uL — ABNORMAL LOW (ref 3.87–5.11)
RDW: 14.2 % (ref 11.5–15.5)
WBC Count: 4.4 10*3/uL (ref 4.0–10.5)
nRBC: 0 % (ref 0.0–0.2)

## 2021-10-26 LAB — CMP (CANCER CENTER ONLY)
ALT: 19 U/L (ref 0–44)
AST: 23 U/L (ref 15–41)
Albumin: 4.3 g/dL (ref 3.5–5.0)
Alkaline Phosphatase: 81 U/L (ref 38–126)
Anion gap: 9 (ref 5–15)
BUN: 11 mg/dL (ref 6–20)
CO2: 28 mmol/L (ref 22–32)
Calcium: 9.6 mg/dL (ref 8.9–10.3)
Chloride: 104 mmol/L (ref 98–111)
Creatinine: 0.91 mg/dL (ref 0.44–1.00)
GFR, Estimated: >60 mL/min (ref >60)
Glucose, Bld: 107 mg/dL — ABNORMAL HIGH (ref 70–99)
Potassium: 3.8 mmol/L (ref 3.5–5.1)
Sodium: 141 mmol/L (ref 135–145)
Total Bilirubin: 0.7 mg/dL (ref 0.3–1.2)
Total Protein: 7.4 g/dL (ref 6.5–8.1)

## 2021-10-26 LAB — RETIC PANEL
Immature Retic Fract: 12.8 % (ref 2.3–15.9)
RBC.: 3.66 MIL/uL — ABNORMAL LOW (ref 3.87–5.11)
Retic Count, Absolute: 67.7 10*3/uL (ref 19.0–186.0)
Retic Ct Pct: 1.9 % (ref 0.4–3.1)
Reticulocyte Hemoglobin: 34.6 pg (ref >27.9)

## 2021-10-26 LAB — VITAMIN D 25 HYDROXY (VIT D DEFICIENCY, FRACTURES): Vit D, 25-Hydroxy: 30.57 ng/mL (ref 30–100)

## 2021-10-26 LAB — FERRITIN: Ferritin: 27 ng/mL (ref 11–307)

## 2021-10-26 LAB — TSH: TSH: 1.052 u[IU]/mL (ref 0.350–4.500)

## 2021-10-26 MED ORDER — CYANOCOBALAMIN 1000 MCG/ML IJ SOLN
1000.0000 ug | Freq: Once | INTRAMUSCULAR | Status: AC
  Administered 2021-10-26: 1000 ug via INTRAMUSCULAR
  Filled 2021-10-26: qty 1

## 2021-10-26 MED ORDER — VITAMIN D (ERGOCALCIFEROL) 1.25 MG (50000 UNIT) PO CAPS: ORAL_CAPSULE | ORAL | 0 refills | Status: DC

## 2021-10-26 NOTE — Telephone Encounter (Signed)
Pt aware and verbalized understanding. Refill sent.

## 2021-10-26 NOTE — Telephone Encounter (Signed)
-----   Message from Gardenia Phlegm, NP sent at 10/26/2021  2:28 PM EDT ----- Her vitamin d level has improved to normal.  I would recommend her taking one more refills worth of the medication and then go to OTC vitamin d ----- Message ----- From: Buel Ream, [facility] [place] Sent: 10/26/2021   8:19 AM EDT To: Gardenia Phlegm, NP

## 2021-10-26 NOTE — Assessment & Plan Note (Signed)
Brooke [name] is a 56 year old woman with history of right-sided stage Ia estrogen positive breast cancer status post right mastectomy and reconstruction along with tamoxifen x5 years completed in 2022.  She is continuing on B12 injections for her B12 deficiency and oral vitamin D.  I sent a message to Venia Carbon who is over her primary care to evaluate what could have happened with the referral sent over to internal medicine.  In the meantime Juna and I talked about her anxiety and depression.  I recommended that she go and get evaluated at the behavioral health urgent care and I gave her the address and phone number of this location which she expressed gratitude of.  Will return every 4 weeks to continue on B12 injections and we will see her back January for her long-term survivorship visit.

## 2021-11-03 ENCOUNTER — Telehealth: Payer: Self-pay | Admitting: Adult Health

## 2021-11-03 NOTE — Telephone Encounter (Signed)
Scheduled appointment per 10/24 los. Left voicemail.

## 2021-11-23 ENCOUNTER — Encounter: Admitting: Adult Health

## 2021-11-23 ENCOUNTER — Inpatient Hospital Stay: Payer: BC Managed Care – PPO

## 2021-12-02 ENCOUNTER — Telehealth: Payer: Self-pay | Admitting: *Deleted

## 2021-12-02 ENCOUNTER — Inpatient Hospital Stay: Payer: BC Managed Care – PPO

## 2021-12-02 NOTE — Telephone Encounter (Signed)
Called patient regarding her 3:30 injection appt. She reports that she spoke to someone earlier to cancel this appt. Appointment was rescheduled for next Thursday per pt request. Scheduling message sent so future injection appts can be adjusted accordingly.

## 2021-12-09 ENCOUNTER — Inpatient Hospital Stay: Payer: BC Managed Care – PPO | Attending: Adult Health

## 2021-12-09 DIAGNOSIS — Z853 Personal history of malignant neoplasm of breast: Secondary | ICD-10-CM | POA: Diagnosis not present

## 2021-12-09 DIAGNOSIS — E538 Deficiency of other specified B group vitamins: Secondary | ICD-10-CM | POA: Insufficient documentation

## 2021-12-09 MED ORDER — CYANOCOBALAMIN 1000 MCG/ML IJ SOLN
1000.0000 ug | Freq: Once | INTRAMUSCULAR | Status: AC
  Administered 2021-12-09: 1000 ug via INTRAMUSCULAR
  Filled 2021-12-09: qty 1

## 2021-12-09 NOTE — Patient Instructions (Signed)
Vitamin B12 Deficiency Vitamin B12 deficiency occurs when the body does not have enough of this important vitamin. The body needs this vitamin: To make red blood cells. To make DNA. This is the genetic material inside cells. To help the nerves work properly so they can carry messages from the brain to the body. Vitamin B12 deficiency can cause health problems, such as not having enough red blood cells in the blood (anemia). This can lead to nerve damage if untreated. What are the causes? This condition may be caused by: Not eating enough foods that contain vitamin B12. Not having enough stomach acid and digestive fluids to properly absorb vitamin B12 from the food that you eat. Having certain diseases that make it hard to absorb vitamin B12. These diseases include Crohn's disease, chronic pancreatitis, and cystic fibrosis. An autoimmune disorder in which the body does not make enough of a protein (intrinsic factor) within the stomach, resulting in not enough absorption of vitamin B12. Having a surgery in which part of the stomach or small intestine is removed. Taking certain medicines that make it hard for the body to absorb vitamin B12. These include: Heartburn medicines, such as antacids and proton pump inhibitors. Some medicines that are used to treat diabetes. What increases the risk? The following factors may make you more likely to develop a vitamin B12 deficiency: Being an older adult. Eating a vegetarian or vegan diet that does not include any foods that come from animals. Eating a poor diet while you are pregnant. Taking certain medicines. Having alcoholism. What are the signs or symptoms? In some cases, there are no symptoms of this condition. If the condition leads to anemia or nerve damage, various symptoms may occur, such as: Weakness. Tiredness (fatigue). Loss of appetite. Numbness or tingling in your hands and feet. Redness and burning of the tongue. Depression,  confusion, or memory problems. Trouble walking. If anemia is severe, symptoms can include: Shortness of breath. Dizziness. Rapid heart rate. How is this diagnosed? This condition may be diagnosed with a blood test to measure the level of vitamin B12 in your blood. You may also have other tests, including: A group of tests that measure certain characteristics of blood cells (complete blood count, CBC). A blood test to measure intrinsic factor. A procedure where a thin tube with a camera on the end is used to look into your stomach or intestines (endoscopy). Other tests may be needed to discover the cause of the deficiency. How is this treated? Treatment for this condition depends on the cause. This condition may be treated by: Changing your eating and drinking habits, such as: Eating more foods that contain vitamin B12. Drinking less alcohol or no alcohol. Getting vitamin B12 injections. Taking vitamin B12 supplements by mouth (orally). Your health care provider will tell you which dose is best for you. Follow these instructions at home: Eating and drinking  Include foods in your diet that come from animals and contain a lot of vitamin B12. These include: Meats and poultry. This includes beef, pork, chicken, turkey, and organ meats, such as liver. Seafood. This includes clams, rainbow trout, salmon, tuna, and haddock. Eggs. Dairy foods such as milk, yogurt, and cheese. Eat foods that have vitamin B12 added to them (are fortified), such as ready-to-eat breakfast cereals. Check the label on the package to see if a food is fortified. The items listed above may not be a complete list of foods and beverages you can eat and drink. Contact a dietitian for   more information. Alcohol use Do not drink alcohol if: Your health care provider tells you not to drink. You are pregnant, may be pregnant, or are planning to become pregnant. If you drink alcohol: Limit how much you have to: 0-1 drink a  day for women. 0-2 drinks a day for men. Know how much alcohol is in your drink. In the [place] one drink equals one 12 oz bottle of beer (355 mL), one 5 oz glass of wine (148 mL), or one 1 oz glass of hard liquor (44 mL). General instructions Get vitamin B12 injections if told to by your health care provider. Take supplements only as told by your health care provider. Follow the directions carefully. Keep all follow-up visits. This is important. Contact a health care provider if: Your symptoms come back. Your symptoms get worse or do not improve with treatment. Get help right away: You develop shortness of breath. You have a rapid heart rate. You have chest pain. You become dizzy or you faint. These symptoms may be an emergency. Get help right away. Call 911. Do not wait to see if the symptoms will go away. Do not drive yourself to the hospital. Summary Vitamin B12 deficiency occurs when the body does not have enough of this important vitamin. Common causes include not eating enough foods that contain vitamin B12, not being able to absorb vitamin B12 from the food that you eat, having a surgery in which part of the stomach or small intestine is removed, or taking certain medicines. Eat foods that have vitamin B12 in them. Treatment may include making a change in the way you eat and drink, getting vitamin B12 injections, or taking vitamin B12 supplements. This information is not intended to replace advice given to you by your health care provider. Make sure you discuss any questions you have with your health care provider. Document Revised: 08/14/2020 Document Reviewed: 08/14/2020 Elsevier Patient Education  2023 [employer]  

## 2021-12-15 ENCOUNTER — Encounter: Payer: Self-pay | Admitting: Adult Health

## 2021-12-21 ENCOUNTER — Inpatient Hospital Stay: Payer: BC Managed Care – PPO

## 2022-01-04 ENCOUNTER — Other Ambulatory Visit: Payer: Self-pay | Admitting: Adult Health

## 2022-01-06 ENCOUNTER — Inpatient Hospital Stay: Payer: BC Managed Care – PPO

## 2022-01-06 ENCOUNTER — Telehealth: Payer: Self-pay | Admitting: Adult Health

## 2022-01-06 ENCOUNTER — Inpatient Hospital Stay: Payer: BC Managed Care – PPO | Attending: Adult Health

## 2022-01-06 ENCOUNTER — Other Ambulatory Visit: Payer: Self-pay

## 2022-01-06 DIAGNOSIS — E538 Deficiency of other specified B group vitamins: Secondary | ICD-10-CM | POA: Insufficient documentation

## 2022-01-06 DIAGNOSIS — Z853 Personal history of malignant neoplasm of breast: Secondary | ICD-10-CM | POA: Insufficient documentation

## 2022-01-06 MED ORDER — CYANOCOBALAMIN 1000 MCG/ML IJ SOLN
1000.0000 ug | Freq: Once | INTRAMUSCULAR | Status: AC
  Administered 2022-01-06: 1000 ug via INTRAMUSCULAR
  Filled 2022-01-06: qty 1

## 2022-01-06 NOTE — Telephone Encounter (Signed)
Patient called to r/s time of appointment on 1/4. Patient r/s and  notified.

## 2022-01-18 ENCOUNTER — Inpatient Hospital Stay: Payer: BC Managed Care – PPO

## 2022-01-23 ENCOUNTER — Other Ambulatory Visit: Payer: Self-pay | Admitting: Adult Health

## 2022-01-23 DIAGNOSIS — I10 Essential (primary) hypertension: Secondary | ICD-10-CM

## 2022-01-24 ENCOUNTER — Encounter: Payer: Self-pay | Admitting: Adult Health

## 2022-01-31 ENCOUNTER — Inpatient Hospital Stay: Payer: BC Managed Care – PPO | Admitting: Adult Health

## 2022-01-31 NOTE — Progress Notes (Deleted)
[facility] Follow up:    Brooke Beals, NP [place] [address]   DIAGNOSIS: Cancer Staging  Malignant neoplasm of upper-inner quadrant of right breast in female, estrogen receptor positive ([place]) Staging form: Breast, AJCC 7th Edition - Clinical stage from 02/25/2015: Stage IIA (T2, N0, M0) - Unsigned Staged by: Pathologist and managing physician Laterality: Right Estrogen receptor status: Positive Progesterone receptor status: Positive HER2 status: Negative Stage used in treatment planning: Yes National guidelines used in treatment planning: Yes Type of national guideline used in treatment planning: NCCN - Pathologic stage from 04/06/2015: Stage IA (T1c, N0, cM0) - Unsigned Laterality: Right   SUMMARY OF ONCOLOGIC HISTORY: Oncology History  Malignant neoplasm of upper-inner quadrant of right breast in female, estrogen receptor positive ([place])  02/12/2015 Mammogram   Right mammogram: UIQ of the right breast, anterior depth, spiculated mass with pleomorphic calcifications measuring ~1.5 cm mammographically. Approximately 5.5 cm posterior to this mass, there is a more subtle area of density with pleomorphic calcs   02/17/2015 Initial Biopsy   Right breast core needle bx: 2 lesions (separated by 10 cm): invasive ductal carcinoma, ER+, PR+, Ki67 25%, HER2/neu negative   02/17/2015 Clinical Stage   Stage II: T2 Nx   02/25/2015 -  Neo-Adjuvant Anti-estrogen oral therapy   Tamoxifen 20 mg began; held 05/2015 due to intolerable side effects   03/06/2015 Procedure   Breast/ Ovarian cancer panel (Gene[facility]): no deleterious mutations at ATM, BARD1, BRCA1, BRCA2, BRIP1, CDH1, CHEK2, FANCC, MLH1, MSH2, MSH6, NBN, PALB2, PMS2, PTEN, RAD51C, RAD51D, TP53, and XRCC2.  VUS called "c.662T>C (p.Ile221Thr)" found XRCC2.   03/10/2015 Procedure   Left breast bx: ADH   03/23/2015 Surgery   Left lumpectomy: ductal hyperplasia   04/06/2015 Definitive Surgery    Right mastectomy/SLNB: invasive ductal carcinoma, grade 2, negative margins, repeat HER2/neu negative   04/06/2015 Pathologic Stage   Stage IA: pT1c N0   04/06/2015 Oncotype testing   RS 5 (5% ROR)   06/22/2015 Survivorship   SCP mailed to patient at her request in lieu of in person visit     CURRENT THERAPY: Observation  INTERVAL HISTORY: Brooke [name] 57 y.o. female returns for follow-up of her history of right-sided breast cancer.  Her most recent mammogram occurred on February 04, 2021 demonstrating no mammographic evidence of malignancy and breast density category B.  A screening mammogram was recommended to be completed in February 2024.  Patient Active Problem List   Diagnosis Date Noted   B12 deficiency 07/24/2021   Major depressive disorder, recurrent episode, mild ([place]) 06/26/2019   GAD (generalized anxiety disorder) 06/26/2019   Alcohol use disorder, mild, abuse 06/26/2019   Genetic testing 03/09/2015   Malignant neoplasm of upper-inner quadrant of right breast in female, estrogen receptor positive ([place]) 02/18/2015   Chronic headaches     has No Known Allergies.  MEDICAL HISTORY: Past Medical History:  Diagnosis Date   Alcohol use disorder, mild, abuse 06/26/2019   Anemia    Anxiety    Breast cancer ([place])    Breast cancer of upper-inner quadrant of right female breast ([place]) 02/18/2015   Chronic headaches    Chronic headaches    Depression    Fatigue    Hot flashes    Yeast infection     SURGICAL HISTORY: Past Surgical History:  Procedure Laterality Date   BREAST LUMPECTOMY Left 2017   BREAST LUMPECTOMY WITH RADIOACTIVE SEED LOCALIZATION Left 04/06/2015   BREAST LUMPECTOMY WITH RADIOACTIVE SEED  LOCALIZATION Left 04/06/2015   Procedure: LEFT BREAST LUMPECTOMY WITH RADIOACTIVE SEED LOCALIZATION;  Surgeon: Alphonsa Overall, MD;  Location: [facility];  Service: General;  Laterality: Left;   BREAST RECONSTRUCTION WITH PLACEMENT OF TISSUE EXPANDER AND FLEX HD (ACELLULAR HYDRATED  DERMIS) Right 04/06/2015   Procedure: RIGHT BREAST RECONSTRUCTION WITH PLACEMENT OF TISSUE EXPANDER, POSSIBLE ACELLULAR DERMIS;  Surgeon: Irene Limbo, MD;  Location: [facility];  Service: Plastics;  Laterality: Right;   BREAST REDUCTION SURGERY Left 08/03/2015   Procedure: LEFT BREAST REDUCTION FOR SYMMETRY  (BREAST);  Surgeon: Irene Limbo, MD;  Location: [facility];  Service: Plastics;  Laterality: Left;   CHOLECYSTECTOMY     GASTRIC BYPASS     LIPOSUCTION WITH LIPOFILLING N/A 08/03/2015   Procedure: LIPOSUCTION WITH LIPOFILLING ;  Surgeon: Irene Limbo, MD;  Location: [facility];  Service: Plastics;  Laterality: N/A;   MASTECTOMY Right 2017   MASTECTOMY W/ SENTINEL NODE BIOPSY Right    MASTECTOMY W/ SENTINEL NODE BIOPSY Right 04/06/2015   Procedure: RIGHT MASTECTOMY WITH SENTINEL LYMPH NODE BIOPSY;  Surgeon: Alphonsa Overall, MD;  Location: [facility];  Service: General;  Laterality: Right;   REDUCTION MAMMAPLASTY Left 2017   REMOVAL OF BILATERAL TISSUE EXPANDERS WITH PLACEMENT OF BILATERAL BREAST IMPLANTS Right 08/03/2015   Procedure: REMOVAL OF RIGHT TISSUE EXPANDERS WITH PLACEMENT OF IMPLANT;  Surgeon: Irene Limbo, MD;  Location: [facility];  Service: Plastics;  Laterality: Right;   TUBAL LIGATION      SOCIAL HISTORY: Social History   Socioeconomic History   Marital status: Married    Spouse name: Not on file   Number of children: 2   Years of education: Not on file   Highest education level: Not on file  Occupational History   Not on file  Tobacco Use   Smoking status: Never   Smokeless tobacco: Never  Substance and Sexual Activity   Alcohol use: Yes    Alcohol/week: 4.0 - 5.0 standard drinks of alcohol    Types: 4 - 5 Standard drinks or equivalent per week    Comment: wine and beer on weekends   Drug use: No   Sexual activity: Yes    Birth control/protection: Surgical  Other Topics Concern   Not on file  Social History  Narrative   Not on file   Social Determinants of Health   Financial Resource Strain: Not on file  Food Insecurity: Not on file  Transportation Needs: Not on file  Physical Activity: Not on file  Stress: Not on file  Social Connections: Not on file  Intimate Partner Violence: Not on file    FAMILY HISTORY: Family History  Problem Relation Age of Onset   Diabetes Mother    Hypertension Mother    Spinal muscular atrophy Mother    Irregular heart beat Mother    Anxiety disorder Mother    Arthritis Mother    Cancer Other        maternal great grandfather dx. unspecified type cancer   Heart attack Maternal Grandmother    Heart attack Maternal Uncle    Cancer Cousin        paternal 1st cousin dx. with cancer that was typically a childhood cancer   Breast cancer Neg Hx     Review of Systems  Constitutional:  Negative for appetite change, chills, fatigue, fever and unexpected weight change.  HENT:   Negative for hearing loss, lump/mass and trouble swallowing.   Eyes:  Negative for eye problems  and icterus.  Respiratory:  Negative for chest tightness, cough and shortness of breath.   Cardiovascular:  Negative for chest pain, leg swelling and palpitations.  Gastrointestinal:  Negative for abdominal distention, abdominal pain, constipation, diarrhea, nausea and vomiting.  Endocrine: Negative for hot flashes.  Genitourinary:  Negative for difficulty urinating.   Musculoskeletal:  Negative for arthralgias.  Skin:  Negative for itching and rash.  Neurological:  Negative for dizziness, extremity weakness, headaches and numbness.  Hematological:  Negative for adenopathy. Does not bruise/bleed easily.  Psychiatric/Behavioral:  Negative for depression. The patient is not nervous/anxious.       PHYSICAL EXAMINATION  ECOG PERFORMANCE STATUS: {CHL ONC ECOG [phone]}  There were no vitals filed for this visit.  Physical Exam Constitutional:      General: She is not in acute  distress.    Appearance: Normal appearance. She is not toxic-appearing.  HENT:     Head: Normocephalic and atraumatic.  Eyes:     General: No scleral icterus. Cardiovascular:     Rate and Rhythm: Normal rate and regular rhythm.     Pulses: Normal pulses.     Heart sounds: Normal heart sounds.  Pulmonary:     Effort: Pulmonary effort is normal.     Breath sounds: Normal breath sounds.  Chest:     Comments: Right breast status postmastectomy no sign of local recurrence left breast is benign. Abdominal:     General: Abdomen is flat. Bowel sounds are normal. There is no distension.     Palpations: Abdomen is soft.     Tenderness: There is no abdominal tenderness.  Musculoskeletal:        General: No swelling.     Cervical back: Neck supple.  Lymphadenopathy:     Cervical: No cervical adenopathy.  Skin:    General: Skin is warm and dry.     Findings: No rash.  Neurological:     General: No focal deficit present.     Mental Status: She is alert.  Psychiatric:        Mood and Affect: Mood normal.        Behavior: Behavior normal.     LABORATORY DATA: None for this visit   ASSESSMENT and THERAPY PLAN:   No problem-specific Assessment & Plan notes found for this encounter.    All questions were answered. The patient knows to call the clinic with any problems, questions or concerns. We can certainly see the patient much sooner if necessary.  Total encounter time:*** minutes*in face-to-face visit time, chart review, lab review, care coordination, order entry, and documentation of the encounter time.    Wilber Bihari, NP 01/31/22 1:47 PM Medical Oncology and Hematology [facility] [place],  [address] Tel. [phone]    Fax. [phone]  *Total Encounter Time as defined by the Centers for Medicare and Medicaid Services includes, in addition to the face-to-face time of a patient visit (documented in the note above) non-face-to-face  time: obtaining and reviewing outside history, ordering and reviewing medications, tests or procedures, care coordination (communications with other health care professionals or caregivers) and documentation in the medical record.

## 2022-02-03 ENCOUNTER — Inpatient Hospital Stay: Payer: BC Managed Care – PPO | Attending: Adult Health

## 2022-02-03 ENCOUNTER — Other Ambulatory Visit: Payer: Self-pay

## 2022-02-03 DIAGNOSIS — E538 Deficiency of other specified B group vitamins: Secondary | ICD-10-CM | POA: Diagnosis present

## 2022-02-03 MED ORDER — CYANOCOBALAMIN 1000 MCG/ML IJ SOLN
1000.0000 ug | Freq: Once | INTRAMUSCULAR | Status: AC
  Administered 2022-02-03: 1000 ug via INTRAMUSCULAR
  Filled 2022-02-03: qty 1

## 2022-02-15 ENCOUNTER — Inpatient Hospital Stay: Payer: BC Managed Care – PPO

## 2022-02-19 ENCOUNTER — Other Ambulatory Visit: Payer: Self-pay | Admitting: Adult Health

## 2022-02-19 DIAGNOSIS — I10 Essential (primary) hypertension: Secondary | ICD-10-CM

## 2022-03-03 ENCOUNTER — Inpatient Hospital Stay: Payer: BC Managed Care – PPO | Admitting: Adult Health

## 2022-03-03 ENCOUNTER — Inpatient Hospital Stay: Payer: BC Managed Care – PPO

## 2022-03-07 ENCOUNTER — Inpatient Hospital Stay: Payer: BC Managed Care – PPO | Attending: Adult Health

## 2022-03-07 DIAGNOSIS — E538 Deficiency of other specified B group vitamins: Secondary | ICD-10-CM | POA: Insufficient documentation

## 2022-03-07 MED ORDER — CYANOCOBALAMIN 1000 MCG/ML IJ SOLN
1000.0000 ug | Freq: Once | INTRAMUSCULAR | Status: AC
  Administered 2022-03-07: 1000 ug via INTRAMUSCULAR
  Filled 2022-03-07: qty 1

## 2022-03-07 NOTE — Patient Instructions (Signed)

## 2022-03-15 ENCOUNTER — Inpatient Hospital Stay: Payer: BC Managed Care – PPO

## 2022-03-30 ENCOUNTER — Other Ambulatory Visit: Payer: Self-pay | Admitting: *Deleted

## 2022-03-30 DIAGNOSIS — Z1231 Encounter for screening mammogram for malignant neoplasm of breast: Secondary | ICD-10-CM

## 2022-03-31 ENCOUNTER — Inpatient Hospital Stay: Payer: BC Managed Care – PPO

## 2022-04-11 ENCOUNTER — Other Ambulatory Visit: Payer: Self-pay

## 2022-04-11 ENCOUNTER — Inpatient Hospital Stay: Payer: BC Managed Care – PPO | Attending: Adult Health | Admitting: Adult Health

## 2022-04-11 ENCOUNTER — Inpatient Hospital Stay: Payer: BC Managed Care – PPO

## 2022-04-11 VITALS — BP 125/65 | HR 66 | Temp 97.7°F | Resp 16 | Ht 71.0 in | Wt 242.3 lb

## 2022-04-11 DIAGNOSIS — E538 Deficiency of other specified B group vitamins: Secondary | ICD-10-CM

## 2022-04-11 DIAGNOSIS — E559 Vitamin D deficiency, unspecified: Secondary | ICD-10-CM | POA: Diagnosis not present

## 2022-04-11 DIAGNOSIS — Z170 Estrogen receptor positive status [ER+]: Secondary | ICD-10-CM

## 2022-04-11 DIAGNOSIS — Z79810 Long term (current) use of selective estrogen receptor modulators (SERMs): Secondary | ICD-10-CM | POA: Insufficient documentation

## 2022-04-11 DIAGNOSIS — C50211 Malignant neoplasm of upper-inner quadrant of right female breast: Secondary | ICD-10-CM

## 2022-04-11 DIAGNOSIS — F411 Generalized anxiety disorder: Secondary | ICD-10-CM | POA: Diagnosis not present

## 2022-04-11 LAB — CBC WITH DIFFERENTIAL (CANCER CENTER ONLY)
Abs Immature Granulocytes: 0.01 10*3/uL (ref 0.00–0.07)
Basophils Absolute: 0 10*3/uL (ref 0.0–0.1)
Basophils Relative: 1 %
Eosinophils Absolute: 0.2 10*3/uL (ref 0.0–0.5)
Eosinophils Relative: 3 %
HCT: 33.6 % — ABNORMAL LOW (ref 36.0–46.0)
Hemoglobin: 11 g/dL — ABNORMAL LOW (ref 12.0–15.0)
Immature Granulocytes: 0 %
Lymphocytes Relative: 43 %
Lymphs Abs: 2.5 10*3/uL (ref 0.7–4.0)
MCH: 30.5 pg (ref 26.0–34.0)
MCHC: 32.7 g/dL (ref 30.0–36.0)
MCV: 93.1 fL (ref 80.0–100.0)
Monocytes Absolute: 0.5 10*3/uL (ref 0.1–1.0)
Monocytes Relative: 9 %
Neutro Abs: 2.5 10*3/uL (ref 1.7–7.7)
Neutrophils Relative %: 44 %
Platelet Count: 284 10*3/uL (ref 150–400)
RBC: 3.61 MIL/uL — ABNORMAL LOW (ref 3.87–5.11)
RDW: 14.5 % (ref 11.5–15.5)
WBC Count: 5.7 10*3/uL (ref 4.0–10.5)
nRBC: 0 % (ref 0.0–0.2)

## 2022-04-11 LAB — CMP (CANCER CENTER ONLY)
ALT: 27 U/L (ref 0–44)
AST: 19 U/L (ref 15–41)
Albumin: 4.3 g/dL (ref 3.5–5.0)
Alkaline Phosphatase: 84 U/L (ref 38–126)
Anion gap: 8 (ref 5–15)
BUN: 12 mg/dL (ref 6–20)
CO2: 28 mmol/L (ref 22–32)
Calcium: 9.6 mg/dL (ref 8.9–10.3)
Chloride: 103 mmol/L (ref 98–111)
Creatinine: 0.86 mg/dL (ref 0.44–1.00)
GFR, Estimated: >60 mL/min (ref >60)
Glucose, Bld: 103 mg/dL — ABNORMAL HIGH (ref 70–99)
Potassium: 3.8 mmol/L (ref 3.5–5.1)
Sodium: 139 mmol/L (ref 135–145)
Total Bilirubin: 0.5 mg/dL (ref 0.3–1.2)
Total Protein: 7.2 g/dL (ref 6.5–8.1)

## 2022-04-11 LAB — VITAMIN D 25 HYDROXY (VIT D DEFICIENCY, FRACTURES): Vit D, 25-Hydroxy: 34.08 ng/mL (ref 30–100)

## 2022-04-11 LAB — VITAMIN B12: Vitamin B-12: 331 pg/mL (ref 180–914)

## 2022-04-11 MED ORDER — VENLAFAXINE HCL ER 37.5 MG PO CP24: 37.5000 mg | ORAL_CAPSULE | Freq: Every day | ORAL | 0 refills | Status: DC

## 2022-04-11 MED ORDER — CYANOCOBALAMIN 1000 MCG/ML IJ SOLN
1000.0000 ug | Freq: Once | INTRAMUSCULAR | Status: AC
  Administered 2022-04-11: 1000 ug via INTRAMUSCULAR
  Filled 2022-04-11: qty 1

## 2022-04-11 NOTE — Assessment & Plan Note (Signed)
Brooke [name] is a 57 year old woman with history of stage IIa right-sided breast cancer ER/PR positive diagnosed in February 2017 status post mastectomy, unable to tolerate antiestrogen therapy.  He has no clinical or radiographic signs of breast cancer recurrence.  She will continue to undergo annual left breast screening mammograms next scheduled in May 2024.  Recommended healthy diet and exercise and that she continue to undergo annual breast exam to evaluate her mastectomy site.  She will return in 1 year for continued long-term follow-up.

## 2022-04-11 NOTE — Assessment & Plan Note (Signed)
We discussed Effexor in detail, risks and benefits were reviewed.  She will start taking this at 37.5 mg daily.  We will follow-up virtually in 2 weeks to discuss how she is tolerating it and whether we can increase to 75 mg daily.

## 2022-04-11 NOTE — Assessment & Plan Note (Signed)
She is currently receiving B12 injections monthly with good tolerance.  We are following up on her CBC and B12 level today--she will continue monthly injections.

## 2022-04-11 NOTE — Progress Notes (Signed)
[facility] Follow up:    [name], NP [address] [place] [address]   DIAGNOSIS: B12 deficiency, history of stage IIA breast cancer    SUMMARY OF ONCOLOGIC HISTORY: Oncology History  Malignant neoplasm of upper-inner quadrant of right breast in female, estrogen receptor positive  02/12/2015 Mammogram   Right mammogram: UIQ of the right breast, anterior depth, spiculated mass with pleomorphic calcifications measuring ~1.5 cm mammographically. Approximately 5.5 cm posterior to this mass, there is a more subtle area of density with pleomorphic calcs   02/17/2015 Initial Biopsy   Right breast core needle bx: 2 lesions (separated by 10 cm): invasive ductal carcinoma, ER+, PR+, Ki67 25%, HER2/neu negative   02/17/2015 Clinical Stage   Stage II: T2 Nx   02/25/2015 -  Neo-Adjuvant Anti-estrogen oral therapy   Tamoxifen 20 mg began; held 05/2015 due to intolerable side effects   03/06/2015 Procedure   Breast/ Ovarian cancer panel (GeneDx): no deleterious mutations at ATM, BARD1, BRCA1, BRCA2, BRIP1, CDH1, CHEK2, FANCC, MLH1, MSH2, MSH6, NBN, PALB2, PMS2, PTEN, RAD51C, RAD51D, TP53, and XRCC2.  VUS called "c.662T>C (p.Ile221Thr)" found XRCC2.   03/10/2015 Procedure   Left breast bx: ADH   03/23/2015 Surgery   Left lumpectomy: ductal hyperplasia   04/06/2015 Definitive Surgery   Right mastectomy/SLNB: invasive ductal carcinoma, grade 2, negative margins, repeat HER2/neu negative   04/06/2015 Pathologic Stage   Stage IA: pT1c N0   04/06/2015 Oncotype testing   RS 5 (5% ROR)   06/22/2015 Survivorship   SCP mailed to patient at her request in lieu of in person visit     CURRENT THERAPY: b12 injections  INTERVAL HISTORY: Brooke [name] 57 y.o. female returns for up of her history of B12 deficiency and breast cancer.  Is receiving B12 injections every 4 weeks and tolerates this well.  She notes that her fatigue is much improved.  She continues to take  vitamin D supplementation for her vitamin D deficiency.  Has not had either 1 of these labs checked recently.  Temprence tells me that unfortunately her right breast implant has ruptured and she will have to have it replaced.  She continues to undergo left breast screening mammograms most recently on February 04, 2021 demonstrating no mammographic evidence of malignancy and breast density category B.  Her next mammogram is scheduled for May 10.  Mircle is concerned about her increased anxiety.  She has become increasingly stressed with her appointments and now with needing to undergo another breast surgery.  She was previously on venlafaxine and wants to know if this can be represcribed.    Patient Active Problem List   Diagnosis Date Noted   B12 deficiency 07/24/2021   Major depressive disorder, recurrent episode, mild 06/26/2019   GAD (generalized anxiety disorder) 06/26/2019   Alcohol use disorder, mild, abuse 06/26/2019   Genetic testing 03/09/2015   Malignant neoplasm of upper-inner quadrant of right breast in female, estrogen receptor positive 02/18/2015   Chronic headaches     has No Known Allergies.  MEDICAL HISTORY: Past Medical History:  Diagnosis Date   Alcohol use disorder, mild, abuse 06/26/2019   Anemia    Anxiety    Breast cancer ([facility])    Breast cancer of upper-inner quadrant of right female breast ([facility]) 02/18/2015   Chronic headaches    Chronic headaches    Depression    Fatigue    Hot flashes    Yeast infection     SURGICAL HISTORY: Past  Surgical History:  Procedure Laterality Date   BREAST LUMPECTOMY Left 2017   BREAST LUMPECTOMY WITH RADIOACTIVE SEED LOCALIZATION Left 04/06/2015   BREAST LUMPECTOMY WITH RADIOACTIVE SEED LOCALIZATION Left 04/06/2015   Procedure: LEFT BREAST LUMPECTOMY WITH RADIOACTIVE SEED LOCALIZATION;  Surgeon: Ovidio Kinavid Newman, MD;  [facility];  Service: General;  Laterality: Left;   BREAST RECONSTRUCTION WITH PLACEMENT OF TISSUE EXPANDER AND  FLEX HD (ACELLULAR HYDRATED DERMIS) Right 04/06/2015   Procedure: RIGHT BREAST RECONSTRUCTION WITH PLACEMENT OF TISSUE EXPANDER, POSSIBLE ACELLULAR DERMIS;  Surgeon: Glenna FellowsBrinda Thimmappa, MD;  Location[facility];  Service: Plastics;  Laterality: Right;   BREAST REDUCTION SURGERY Left 08/03/2015   Procedure: LEFT BREAST REDUCTION FOR SYMMETRY  (BREAST);  Surgeon: Glenna FellowsBrinda Thimmappa, MD;  Location: [facility];  Service: Plastics;  Laterality: Left;   CHOLECYSTECTOMY     GASTRIC BYPASS     LIPOSUCTION WITH LIPOFILLING N/A 08/03/2015   Procedure: LIPOSUCTION WITH LIPOFILLING ;  Surgeon: Glenna FellowsBrinda Thimmappa, MD;  Location: [facility];  Service: Plastics;  Laterality: N/A;   MASTECTOMY Right 2017   MASTECTOMY W/ SENTINEL NODE BIOPSY Right    MASTECTOMY W/ SENTINEL NODE BIOPSY Right 04/06/2015   Procedure: RIGHT MASTECTOMY WITH SENTINEL LYMPH NODE BIOPSY;  Surgeon: Ovidio Kinavid Newman, MD;  [facility];  Service: General;  Laterality: Right;   REDUCTION MAMMAPLASTY Left 2017   REMOVAL OF BILATERAL TISSUE EXPANDERS WITH PLACEMENT OF BILATERAL BREAST IMPLANTS Right 08/03/2015   Procedure: REMOVAL OF RIGHT TISSUE EXPANDERS WITH PLACEMENT OF IMPLANT;  Surgeon: Glenna FellowsBrinda Thimmappa, MD;  Location: [facility];  Service: Plastics;  Laterality: Right;   TUBAL LIGATION      SOCIAL HISTORY: Social History   Socioeconomic History   Marital status: Married    Spouse name: Not on file   Number of children: 2   Years of education: Not on file   Highest education level: Not on file  Occupational History   Not on file  Tobacco Use   Smoking status: Never   Smokeless tobacco: Never  Substance and Sexual Activity   Alcohol use: Yes    Alcohol/week: 4.0 - 5.0 standard drinks of alcohol    Types: 4 - 5 Standard drinks or equivalent per week    Comment: wine and beer on weekends   Drug use: No   Sexual activity: Yes    Birth control/protection: Surgical  Other Topics Concern   Not on  file  Social History Narrative   Not on file   Social Determinants of Health   Financial Resource Strain: Not on file  Food Insecurity: Not on file  Transportation Needs: Not on file  Physical Activity: Not on file  Stress: Not on file  Social Connections: Not on file  Intimate Partner Violence: Not on file    FAMILY HISTORY: Family History  Problem Relation Age of Onset   Diabetes Mother    Hypertension Mother    Spinal muscular atrophy Mother    Irregular heart beat Mother    Anxiety disorder Mother    Arthritis Mother    Cancer Other        maternal great grandfather dx. unspecified type cancer   Heart attack Maternal Grandmother    Heart attack Maternal Uncle    Cancer Cousin        paternal 1st cousin dx. with cancer that was typically a childhood cancer   Breast cancer Neg Hx     Review of Systems  Constitutional:  Negative for  appetite change, chills, fatigue, fever and unexpected weight change.  HENT:   Negative for hearing loss, lump/mass and trouble swallowing.   Eyes:  Negative for eye problems and icterus.  Respiratory:  Negative for chest tightness, cough and shortness of breath.   Cardiovascular:  Negative for chest pain, leg swelling and palpitations.  Gastrointestinal:  Negative for abdominal distention, abdominal pain, constipation, diarrhea, nausea and vomiting.  Endocrine: Negative for hot flashes.  Genitourinary:  Negative for difficulty urinating.   Musculoskeletal:  Negative for arthralgias.  Skin:  Negative for itching and rash.  Neurological:  Negative for dizziness, extremity weakness, headaches and numbness.  Hematological:  Negative for adenopathy. Does not bruise/bleed easily.  Psychiatric/Behavioral:  Negative for depression. The patient is nervous/anxious.       PHYSICAL EXAMINATION  ECOG PERFORMANCE STATUS: 1 - Symptomatic but completely ambulatory  Vitals:   04/11/22 1431  BP: 125/65  Pulse: 66  Resp: 16  Temp: 97.7 F (36.5  C)  SpO2: 100%    Physical Exam Constitutional:      General: She is not in acute distress.    Appearance: Normal appearance. She is not toxic-appearing.  HENT:     Head: Normocephalic and atraumatic.  Eyes:     General: No scleral icterus. Cardiovascular:     Rate and Rhythm: Normal rate and regular rhythm.     Pulses: Normal pulses.     Heart sounds: Normal heart sounds.  Pulmonary:     Effort: Pulmonary effort is normal.     Breath sounds: Normal breath sounds.  Chest:     Comments: Declined breast exam today Abdominal:     General: Abdomen is flat. Bowel sounds are normal. There is no distension.     Palpations: Abdomen is soft.     Tenderness: There is no abdominal tenderness.  Musculoskeletal:        General: No swelling.     Cervical back: Neck supple.  Lymphadenopathy:     Cervical: No cervical adenopathy.  Skin:    General: Skin is warm and dry.     Findings: No rash.  Neurological:     General: No focal deficit present.     Mental Status: She is alert.  Psychiatric:        Mood and Affect: Mood normal.        Behavior: Behavior normal.     LABORATORY DATA: Pending    ASSESSMENT and THERAPY PLAN:   Malignant neoplasm of upper-inner quadrant of right breast in female, estrogen receptor positive ([facility]) Brooke [name] is a 57 year old woman with history of stage IIa right-sided breast cancer ER/PR positive diagnosed in February 2017 status post mastectomy, unable to tolerate antiestrogen therapy.  He has no clinical or radiographic signs of breast cancer recurrence.  She will continue to undergo annual left breast screening mammograms next scheduled in May 2024.  Recommended healthy diet and exercise and that she continue to undergo annual breast exam to evaluate her mastectomy site.  She will return in 1 year for continued long-term follow-up.  B12 deficiency She is currently receiving B12 injections monthly with good tolerance.  We are following up on her CBC  and B12 level today--she will continue monthly injections.    GAD (generalized anxiety disorder) We discussed Effexor in detail, risks and benefits were reviewed.  She will start taking this at 37.5 mg daily.  We will follow-up virtually in 2 weeks to discuss how she is tolerating it and whether we can increase to  75 mg daily.  All questions were answered. The patient knows to call the clinic with any problems, questions or concerns. We can certainly see the patient much sooner if necessary.  Total encounter time:30 minutes*in face-to-face visit time, chart review, lab review, care coordination, order entry, and documentation of the encounter time.  Lillard Anes, NP 04/11/22 3:14 PM Medical Oncology and Hematology [facility] [address] [place], [place] [address] Tel. [phone]    Fax. [phone]  *Total Encounter Time as defined by the Centers for Medicare and Medicaid Services includes, in addition to the face-to-face time of a patient visit (documented in the note above) non-face-to-face time: obtaining and reviewing outside history, ordering and reviewing medications, tests or procedures, care coordination (communications with other health care professionals or caregivers) and documentation in the medical record.

## 2022-04-12 ENCOUNTER — Inpatient Hospital Stay: Payer: BC Managed Care – PPO

## 2022-04-13 ENCOUNTER — Telehealth: Payer: Self-pay | Admitting: Adult Health

## 2022-04-13 NOTE — Telephone Encounter (Signed)
Scheduled appointments per 4/8 los. Patient is aware of the made appointments.

## 2022-04-25 ENCOUNTER — Inpatient Hospital Stay (HOSPITAL_BASED_OUTPATIENT_CLINIC_OR_DEPARTMENT_OTHER): Payer: BC Managed Care – PPO | Admitting: Adult Health

## 2022-04-25 DIAGNOSIS — C50211 Malignant neoplasm of upper-inner quadrant of right female breast: Secondary | ICD-10-CM

## 2022-04-25 DIAGNOSIS — Z170 Estrogen receptor positive status [ER+]: Secondary | ICD-10-CM

## 2022-04-25 NOTE — Progress Notes (Signed)
Appointment scheduled, but patient did not get on the video visit and did not answer the phone when called.  LMOM.

## 2022-04-28 ENCOUNTER — Inpatient Hospital Stay: Payer: BC Managed Care – PPO

## 2022-05-09 ENCOUNTER — Inpatient Hospital Stay: Payer: BC Managed Care – PPO | Attending: Adult Health

## 2022-05-10 ENCOUNTER — Other Ambulatory Visit: Payer: Self-pay | Admitting: Adult Health

## 2022-05-10 DIAGNOSIS — F411 Generalized anxiety disorder: Secondary | ICD-10-CM

## 2022-05-13 ENCOUNTER — Ambulatory Visit
Admission: RE | Admit: 2022-05-13 | Discharge: 2022-05-13 | Disposition: A | Discharge status: Home / Self Care | Payer: BC Managed Care – PPO | Source: Ambulatory Visit | Attending: *Deleted | Admitting: *Deleted

## 2022-05-13 DIAGNOSIS — Z1231 Encounter for screening mammogram for malignant neoplasm of breast: Secondary | ICD-10-CM

## 2022-06-06 ENCOUNTER — Inpatient Hospital Stay: Payer: BC Managed Care – PPO | Attending: Adult Health

## 2022-06-17 ENCOUNTER — Encounter (HOSPITAL_BASED_OUTPATIENT_CLINIC_OR_DEPARTMENT_OTHER): Payer: Self-pay | Admitting: Plastic Surgery

## 2022-06-17 ENCOUNTER — Other Ambulatory Visit: Payer: Self-pay

## 2022-06-23 ENCOUNTER — Encounter (HOSPITAL_BASED_OUTPATIENT_CLINIC_OR_DEPARTMENT_OTHER)
Admission: RE | Admit: 2022-06-23 | Discharge: 2022-06-23 | Disposition: A | Discharge status: Home / Self Care | Payer: BC Managed Care – PPO | Source: Ambulatory Visit | Attending: Plastic Surgery | Admitting: Plastic Surgery

## 2022-06-23 DIAGNOSIS — Z01818 Encounter for other preprocedural examination: Secondary | ICD-10-CM | POA: Insufficient documentation

## 2022-06-23 DIAGNOSIS — Z9011 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: Secondary | ICD-10-CM | POA: Diagnosis not present

## 2022-06-23 DIAGNOSIS — T8549XA Other mechanical complication of breast prosthesis and implant, initial encounter: Secondary | ICD-10-CM | POA: Diagnosis present

## 2022-06-23 DIAGNOSIS — Z9884 Bariatric surgery status: Secondary | ICD-10-CM | POA: Diagnosis not present

## 2022-06-23 DIAGNOSIS — Z853 Personal history of malignant neoplasm of breast: Secondary | ICD-10-CM | POA: Diagnosis not present

## 2022-06-23 DIAGNOSIS — Z9882 Breast implant status: Secondary | ICD-10-CM | POA: Diagnosis not present

## 2022-06-23 DIAGNOSIS — Y831 Surgical operation with implant of artificial internal device as the cause of abnormal reaction of the patient, or of later complication, without mention of misadventure at the time of the procedure: Secondary | ICD-10-CM | POA: Diagnosis not present

## 2022-06-23 LAB — BASIC METABOLIC PANEL
Anion gap: 11 (ref 5–15)
BUN: 10 mg/dL (ref 6–20)
CO2: 23 mmol/L (ref 22–32)
Calcium: 8.2 mg/dL — ABNORMAL LOW (ref 8.9–10.3)
Chloride: 106 mmol/L (ref 98–111)
Creatinine, Ser: 0.68 mg/dL (ref 0.44–1.00)
GFR, Estimated: >60 mL/min (ref >60)
Glucose, Bld: 99 mg/dL (ref 70–99)
Potassium: 4.5 mmol/L (ref 3.5–5.1)
Sodium: 140 mmol/L (ref 135–145)

## 2022-06-23 MED ORDER — CHLORHEXIDINE GLUCONATE CLOTH 2 % EX PADS: 6.0000 | MEDICATED_PAD | Freq: Once | CUTANEOUS | Status: DC

## 2022-06-23 NOTE — Progress Notes (Signed)

## 2022-06-24 ENCOUNTER — Encounter (HOSPITAL_BASED_OUTPATIENT_CLINIC_OR_DEPARTMENT_OTHER)
Admission: RE | Disposition: A | Discharge status: Home / Self Care | Payer: Self-pay | Source: Home / Self Care | Attending: Plastic Surgery

## 2022-06-24 ENCOUNTER — Ambulatory Visit (HOSPITAL_BASED_OUTPATIENT_CLINIC_OR_DEPARTMENT_OTHER): Payer: BC Managed Care – PPO | Admitting: Anesthesiology

## 2022-06-24 ENCOUNTER — Encounter (HOSPITAL_BASED_OUTPATIENT_CLINIC_OR_DEPARTMENT_OTHER): Payer: Self-pay | Admitting: Plastic Surgery

## 2022-06-24 ENCOUNTER — Other Ambulatory Visit: Payer: Self-pay

## 2022-06-24 ENCOUNTER — Ambulatory Visit (HOSPITAL_BASED_OUTPATIENT_CLINIC_OR_DEPARTMENT_OTHER)
Admission: RE | Admit: 2022-06-24 | Discharge: 2022-06-24 | Disposition: A | Discharge status: Home / Self Care | Payer: BC Managed Care – PPO | Attending: Plastic Surgery | Admitting: Plastic Surgery

## 2022-06-24 DIAGNOSIS — Z9011 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: Secondary | ICD-10-CM | POA: Insufficient documentation

## 2022-06-24 DIAGNOSIS — Z9884 Bariatric surgery status: Secondary | ICD-10-CM | POA: Insufficient documentation

## 2022-06-24 DIAGNOSIS — Z853 Personal history of malignant neoplasm of breast: Secondary | ICD-10-CM | POA: Insufficient documentation

## 2022-06-24 DIAGNOSIS — T8549XA Other mechanical complication of breast prosthesis and implant, initial encounter: Secondary | ICD-10-CM | POA: Insufficient documentation

## 2022-06-24 DIAGNOSIS — Y831 Surgical operation with implant of artificial internal device as the cause of abnormal reaction of the patient, or of later complication, without mention of misadventure at the time of the procedure: Secondary | ICD-10-CM | POA: Insufficient documentation

## 2022-06-24 DIAGNOSIS — Z9882 Breast implant status: Secondary | ICD-10-CM | POA: Insufficient documentation

## 2022-06-24 DIAGNOSIS — Z01818 Encounter for other preprocedural examination: Secondary | ICD-10-CM

## 2022-06-24 HISTORY — DX: Essential (primary) hypertension: I10

## 2022-06-24 HISTORY — PX: CAPSULECTOMY: SHX5381

## 2022-06-24 HISTORY — PX: BREAST IMPLANT REMOVAL: SHX5361

## 2022-06-24 SURGERY — REMOVAL, IMPLANT, BREAST
Anesthesia: General | Site: Chest | Laterality: Right

## 2022-06-24 MED ORDER — DEXAMETHASONE SODIUM PHOSPHATE 10 MG/ML IJ SOLN
INTRAMUSCULAR | Status: DC | PRN
  Administered 2022-06-24: 10 mg via INTRAVENOUS

## 2022-06-24 MED ORDER — MIDAZOLAM HCL 5 MG/5ML IJ SOLN
INTRAMUSCULAR | Status: DC | PRN
  Administered 2022-06-24: 2 mg via INTRAVENOUS

## 2022-06-24 MED ORDER — ONDANSETRON HCL 4 MG/2ML IJ SOLN
INTRAMUSCULAR | Status: DC | PRN
  Administered 2022-06-24: 4 mg via INTRAVENOUS

## 2022-06-24 MED ORDER — BUPIVACAINE HCL (PF) 0.5 % IJ SOLN
INTRAMUSCULAR | Status: DC | PRN
  Administered 2022-06-24: 20 mL

## 2022-06-24 MED ORDER — DEXAMETHASONE SODIUM PHOSPHATE 10 MG/ML IJ SOLN
INTRAMUSCULAR | Status: AC
  Filled 2022-06-24: qty 1

## 2022-06-24 MED ORDER — ACETAMINOPHEN 500 MG PO TABS
ORAL_TABLET | ORAL | Status: AC
  Filled 2022-06-24: qty 2

## 2022-06-24 MED ORDER — FENTANYL CITRATE (PF) 100 MCG/2ML IJ SOLN
INTRAMUSCULAR | Status: AC
  Filled 2022-06-24: qty 2

## 2022-06-24 MED ORDER — FENTANYL CITRATE (PF) 100 MCG/2ML IJ SOLN
25.0000 ug | INTRAMUSCULAR | Status: DC | PRN
  Administered 2022-06-24: 25 ug via INTRAVENOUS
  Administered 2022-06-24: 50 ug via INTRAVENOUS

## 2022-06-24 MED ORDER — GABAPENTIN 300 MG PO CAPS
300.0000 mg | ORAL_CAPSULE | ORAL | Status: AC
  Administered 2022-06-24: 300 mg via ORAL

## 2022-06-24 MED ORDER — CELECOXIB 200 MG PO CAPS
ORAL_CAPSULE | ORAL | Status: AC
  Filled 2022-06-24: qty 1

## 2022-06-24 MED ORDER — OXYCODONE HCL 5 MG PO TABS
ORAL_TABLET | ORAL | Status: AC
  Filled 2022-06-24: qty 1

## 2022-06-24 MED ORDER — PROPOFOL 10 MG/ML IV BOLUS
INTRAVENOUS | Status: AC
  Filled 2022-06-24: qty 20

## 2022-06-24 MED ORDER — LIDOCAINE HCL (CARDIAC) PF 100 MG/5ML IV SOSY
PREFILLED_SYRINGE | INTRAVENOUS | Status: DC | PRN
  Administered 2022-06-24: 60 mg via INTRAVENOUS

## 2022-06-24 MED ORDER — HYDROMORPHONE HCL 1 MG/ML IJ SOLN
INTRAMUSCULAR | Status: DC | PRN
  Administered 2022-06-24: .5 mg via INTRAVENOUS

## 2022-06-24 MED ORDER — LIDOCAINE 2% (20 MG/ML) 5 ML SYRINGE
INTRAMUSCULAR | Status: AC
  Filled 2022-06-24: qty 5

## 2022-06-24 MED ORDER — BUPIVACAINE-EPINEPHRINE 0.25% -1:200000 IJ SOLN: INTRAMUSCULAR | Status: DC | PRN

## 2022-06-24 MED ORDER — HYDROMORPHONE HCL 1 MG/ML IJ SOLN
INTRAMUSCULAR | Status: AC
  Filled 2022-06-24: qty 0.5

## 2022-06-24 MED ORDER — OXYCODONE HCL 5 MG/5ML PO SOLN: 5.0000 mg | Freq: Once | ORAL | Status: AC | PRN

## 2022-06-24 MED ORDER — PROPOFOL 10 MG/ML IV BOLUS
INTRAVENOUS | Status: DC | PRN
  Administered 2022-06-24: 200 mg via INTRAVENOUS
  Administered 2022-06-24: 50 mg via INTRAVENOUS

## 2022-06-24 MED ORDER — MIDAZOLAM HCL 2 MG/2ML IJ SOLN
INTRAMUSCULAR | Status: AC
  Filled 2022-06-24: qty 2

## 2022-06-24 MED ORDER — CELECOXIB 200 MG PO CAPS
200.0000 mg | ORAL_CAPSULE | ORAL | Status: AC
  Administered 2022-06-24: 200 mg via ORAL

## 2022-06-24 MED ORDER — CEFAZOLIN SODIUM-DEXTROSE 2-4 GM/100ML-% IV SOLN
INTRAVENOUS | Status: AC
  Filled 2022-06-24: qty 100

## 2022-06-24 MED ORDER — 0.9 % SODIUM CHLORIDE (POUR BTL) OPTIME
TOPICAL | Status: DC | PRN
  Administered 2022-06-24: 200 mL

## 2022-06-24 MED ORDER — ONDANSETRON HCL 4 MG/2ML IJ SOLN: 4.0000 mg | Freq: Four times a day (QID) | INTRAMUSCULAR | Status: DC | PRN

## 2022-06-24 MED ORDER — CEFAZOLIN SODIUM-DEXTROSE 2-4 GM/100ML-% IV SOLN
2.0000 g | INTRAVENOUS | Status: AC
  Administered 2022-06-24: 2 g via INTRAVENOUS

## 2022-06-24 MED ORDER — FENTANYL CITRATE (PF) 100 MCG/2ML IJ SOLN
INTRAMUSCULAR | Status: DC | PRN
  Administered 2022-06-24 (×3): 25 ug via INTRAVENOUS
  Administered 2022-06-24: 50 ug via INTRAVENOUS
  Administered 2022-06-24: 25 ug via INTRAVENOUS
  Administered 2022-06-24: 50 ug via INTRAVENOUS

## 2022-06-24 MED ORDER — OXYCODONE HCL 5 MG PO TABS
5.0000 mg | ORAL_TABLET | Freq: Once | ORAL | Status: AC | PRN
  Administered 2022-06-24: 5 mg via ORAL

## 2022-06-24 MED ORDER — ONDANSETRON HCL 4 MG/2ML IJ SOLN
INTRAMUSCULAR | Status: AC
  Filled 2022-06-24: qty 2

## 2022-06-24 MED ORDER — ACETAMINOPHEN 500 MG PO TABS
1000.0000 mg | ORAL_TABLET | ORAL | Status: AC
  Administered 2022-06-24: 1000 mg via ORAL

## 2022-06-24 MED ORDER — OXYCODONE HCL 5 MG PO TABS: 5.0000 mg | ORAL_TABLET | ORAL | 0 refills | Status: DC | PRN

## 2022-06-24 MED ORDER — LACTATED RINGERS IV SOLN: INTRAVENOUS | Status: DC

## 2022-06-24 MED ORDER — GABAPENTIN 300 MG PO CAPS
ORAL_CAPSULE | ORAL | Status: AC
  Filled 2022-06-24: qty 1

## 2022-06-24 SURGICAL SUPPLY — 71 items
ADH SKN CLS APL DERMABOND .7 (GAUZE/BANDAGES/DRESSINGS) ×1
APL PRP STRL LF DISP 70% ISPRP (MISCELLANEOUS) ×2
BAG DECANTER FOR FLEXI CONT (MISCELLANEOUS) ×1 IMPLANT
BINDER BREAST LRG (GAUZE/BANDAGES/DRESSINGS) IMPLANT
BINDER BREAST MEDIUM (GAUZE/BANDAGES/DRESSINGS) IMPLANT
BINDER BREAST XLRG (GAUZE/BANDAGES/DRESSINGS) IMPLANT
BINDER BREAST XXLRG (GAUZE/BANDAGES/DRESSINGS) IMPLANT
BLADE SURG 10 STRL SS (BLADE) ×1 IMPLANT
BLADE SURG 15 STRL LF DISP TIS (BLADE) IMPLANT
BLADE SURG 15 STRL SS (BLADE)
BNDG GAUZE DERMACEA FLUFF 4 (GAUZE/BANDAGES/DRESSINGS) IMPLANT
BNDG GZE DERMACEA 4 6PLY (GAUZE/BANDAGES/DRESSINGS)
CANISTER SUCT 1200ML W/VALVE (MISCELLANEOUS) ×1 IMPLANT
CHLORAPREP W/TINT 26 (MISCELLANEOUS) ×1 IMPLANT
COVER BACK TABLE 60X90IN (DRAPES) ×1 IMPLANT
COVER MAYO STAND STRL (DRAPES) ×1 IMPLANT
DERMABOND ADVANCED .7 DNX12 (GAUZE/BANDAGES/DRESSINGS) ×1 IMPLANT
DRAIN CHANNEL 15F RND FF W/TCR (WOUND CARE) IMPLANT
DRAPE INCISE IOBAN 66X45 STRL (DRAPES) IMPLANT
DRAPE TOP ARMCOVERS (MISCELLANEOUS) ×1 IMPLANT
DRAPE U-SHAPE 76X120 STRL (DRAPES) ×1 IMPLANT
DRAPE UTILITY XL STRL (DRAPES) ×1 IMPLANT
ELECT BLADE 4.0 EZ CLEAN MEGAD (MISCELLANEOUS)
ELECT BLADE 6.5 EXT (BLADE) IMPLANT
ELECT COATED BLADE 2.86 ST (ELECTRODE) ×1 IMPLANT
ELECT REM PT RETURN 9FT ADLT (ELECTROSURGICAL) ×1
ELECTRODE BLDE 4.0 EZ CLN MEGD (MISCELLANEOUS) IMPLANT
ELECTRODE REM PT RTRN 9FT ADLT (ELECTROSURGICAL) ×1 IMPLANT
EVACUATOR SILICONE 100CC (DRAIN) IMPLANT
GAUZE PAD ABD 8X10 STRL (GAUZE/BANDAGES/DRESSINGS) ×1 IMPLANT
GAUZE SPONGE 4X4 12PLY STRL (GAUZE/BANDAGES/DRESSINGS) IMPLANT
GAUZE SPONGE 4X4 12PLY STRL LF (GAUZE/BANDAGES/DRESSINGS) IMPLANT
GLOVE BIO SURGEON STRL SZ 6 (GLOVE) ×1 IMPLANT
GLOVE BIO SURGEON STRL SZ8 (GLOVE) IMPLANT
GLOVE BIOGEL PI IND STRL 7.0 (GLOVE) IMPLANT
GLOVE BIOGEL PI IND STRL 8 (GLOVE) IMPLANT
GLOVE SURG SS PI 7.0 STRL IVOR (GLOVE) IMPLANT
GOWN STRL REUS W/ TWL LRG LVL3 (GOWN DISPOSABLE) ×2 IMPLANT
GOWN STRL REUS W/ TWL XL LVL3 (GOWN DISPOSABLE) IMPLANT
GOWN STRL REUS W/TWL LRG LVL3 (GOWN DISPOSABLE) ×2
GOWN STRL REUS W/TWL XL LVL3 (GOWN DISPOSABLE) ×1
MARKER SKIN DUAL TIP RULER LAB (MISCELLANEOUS) IMPLANT
NDL HYPO 25X1 1.5 SAFETY (NEEDLE) IMPLANT
NEEDLE HYPO 25X1 1.5 SAFETY (NEEDLE) ×1 IMPLANT
NS IRRIG 1000ML POUR BTL (IV SOLUTION) IMPLANT
PACK BASIN DAY SURGERY FS (CUSTOM PROCEDURE TRAY) ×1 IMPLANT
PENCIL SMOKE EVACUATOR (MISCELLANEOUS) ×1 IMPLANT
PIN SAFETY STERILE (MISCELLANEOUS) IMPLANT
SHEET MEDIUM DRAPE 40X70 STRL (DRAPES) ×1 IMPLANT
SLEEVE SCD COMPRESS KNEE MED (STOCKING) ×1 IMPLANT
SPIKE FLUID TRANSFER (MISCELLANEOUS) IMPLANT
SPONGE T-LAP 18X18 ~~LOC~~+RFID (SPONGE) ×2 IMPLANT
STAPLER VISISTAT 35W (STAPLE) ×1 IMPLANT
STRIP CLOSURE SKIN 1/2X4 (GAUZE/BANDAGES/DRESSINGS) IMPLANT
SUT ETHILON 2 0 FS 18 (SUTURE) IMPLANT
SUT MNCRL AB 4-0 PS2 18 (SUTURE) ×1 IMPLANT
SUT VIC AB 3-0 PS1 18 (SUTURE) ×2
SUT VIC AB 3-0 PS1 18XBRD (SUTURE) IMPLANT
SUT VIC AB 3-0 SH 27 (SUTURE)
SUT VIC AB 3-0 SH 27X BRD (SUTURE) IMPLANT
SUT VIC AB 4-0 PS2 18 (SUTURE) ×1 IMPLANT
SUT VLOC 180 0 24IN GS25 (SUTURE) IMPLANT
SWAB COLLECTION DEVICE MRSA (MISCELLANEOUS) IMPLANT
SWAB CULTURE ESWAB REG 1ML (MISCELLANEOUS) IMPLANT
SYR 50ML LL SCALE MARK (SYRINGE) IMPLANT
SYR BULB IRRIG 60ML STRL (SYRINGE) ×1 IMPLANT
SYR CONTROL 10ML LL (SYRINGE) IMPLANT
TOWEL GREEN STERILE FF (TOWEL DISPOSABLE) ×2 IMPLANT
TUBE CONNECTING 20X1/4 (TUBING) ×1 IMPLANT
UNDERPAD 30X36 HEAVY ABSORB (UNDERPADS AND DIAPERS) ×2 IMPLANT
YANKAUER SUCT BULB TIP NO VENT (SUCTIONS) ×1 IMPLANT

## 2022-06-24 NOTE — Op Note (Signed)
Operative Note   DATE OF OPERATION: 6.21.2024  LOCATION: [facility]-outpatient  SURGICAL DIVISION: Plastic Surgery  PREOPERATIVE DIAGNOSES:  1. History breast cancer 2. Acquired absence breast 3. Mechanical complication breast implant  POSTOPERATIVE DIAGNOSES:  same  PROCEDURE:  Removal right breast implant with partial capsuelctomy  SURGEON: Glenna Fellows MD MBA  ASSISTANT: none  ANESTHESIA:  General.   EBL: <30 ml  COMPLICATIONS: None immediate.   INDICATIONS FOR PROCEDURE:  The patient, Brooke [name], is a 56 y.o. female born on [birth year], is here for removal of right breast implant following deflation. Patient does not desire additional breast reconstruction and asks for removal alone.    FINDINGS: Removed deflated smooth round saline implant. Capsule thin with no seroma present.   DESCRIPTION OF PROCEDURE:  The patient's operative site was marked with the patient in the preoperative area in standing position to include redundant mastectomy flap skin and soft tissue foe excision. The patient was taken to the operating room. SCDs were placed and IV antibiotics were given. The patient's operative site was prepped and draped in a sterile fashion. A time out was performed and all information was confirmed to be correct. Elliptical skin Incision made over right chest to include redundant soft tissue. This was carried to pectoralis muscle and serratus muscle surface and excised. Capsule entered at lateral border pectoralis and implant removed. No seroma noted. Thin capsule noted. Partial anterior capsulectomy performed to aid with adherence muscle to chest. Local anesthetic infiltrated. 15 Fr JP placed beneath muscle and secured with 2-0 nylon. Limited separation of mastectomy flap from anterior surface pectoralis and serratus muscle completed. Pectoralis muscle inset to chest wall with 0 V lock suture. Skin closure completed with running and interrupted 3-0 vicryl in dermis.  Skin closure completed with 4-0 monocryl subcuticular. Dermabond and dry dressing applied. Breast binder applied.   The patient was allowed to wake from anesthesia, extubated and taken to the recovery room in satisfactory condition.   SPECIMENS: right mastectomy flap and capsule  DRAINS: 15 Fr JP with right subpectoral and subcutaneous chest  Glenna Fellows, MD [facility]  Office/ physician access line after hours [phone]

## 2022-06-24 NOTE — Anesthesia Postprocedure Evaluation (Signed)
Anesthesia Post Note  Patient: Brooke [name]  Procedure([name]) Performed: REMOVAL BREAST IMPLANT (Right: Chest) CAPSULECTOMY (Right: Chest)     Patient location during evaluation: PACU Anesthesia Type: General Level of consciousness: awake and alert Pain management: pain level controlled Vital Signs Assessment: post-procedure vital signs reviewed and stable Respiratory status: spontaneous breathing, nonlabored ventilation, respiratory function stable and patient connected to nasal cannula oxygen Cardiovascular status: blood pressure returned to baseline and stable Postop Assessment: no apparent nausea or vomiting Anesthetic complications: no   No notable events documented.  Last Vitals:  Vitals:   06/24/22 1528 06/24/22 1610  BP: (!) 174/99 (!) 171/98  Pulse: 69   Resp: 20   Temp: 36.7 C   SpO2: 97%     Last Pain:  Vitals:   06/24/22 1527  TempSrc:   PainSc: 5                  Brooke [name]

## 2022-06-24 NOTE — Anesthesia Preprocedure Evaluation (Signed)
Anesthesia Evaluation  Patient identified by MRN, date of birth, ID band Patient awake    Reviewed: Allergy & Precautions, H&P , NPO status , Patient's Chart, lab work & pertinent test results  Airway Mallampati: II   Neck ROM: full    Dental   Pulmonary neg pulmonary ROS   breath sounds clear to auscultation       Cardiovascular hypertension,  Rhythm:regular Rate:Normal     Neuro/Psych  Headaches PSYCHIATRIC DISORDERS Anxiety Depression       GI/Hepatic   Endo/Other    Renal/GU      Musculoskeletal   Abdominal   Peds  Hematology   Anesthesia Other Findings   Reproductive/Obstetrics                             Anesthesia Physical Anesthesia Plan  ASA: 2  Anesthesia Plan: General   Post-op Pain Management:    Induction: Intravenous  PONV Risk Score and Plan: 3 and Ondansetron, Dexamethasone, Midazolam and Treatment may vary due to age or medical condition  Airway Management Planned: LMA  Additional Equipment:   Intra-op Plan:   Post-operative Plan: Extubation in OR  Informed Consent: I have reviewed the patients History and Physical, chart, labs and discussed the procedure including the risks, benefits and alternatives for the proposed anesthesia with the patient or authorized representative who has indicated his/her understanding and acceptance.     Dental advisory given  Plan Discussed with: CRNA, Anesthesiologist and Surgeon  Anesthesia Plan Comments:        Anesthesia Quick Evaluation

## 2022-06-24 NOTE — H&P (Signed)
Subjective:   HPI  Onset deflation right implant onset April 2024, presents for removal and no replacement.    Presented following screening MMG with right breast mass and calcifications . Diagnostic MMG revealed a spiculated mass with pleomorphic calcifications measuring 1.5 cm in the UIQ of the right breast. Approximately 5.5 cm posterior to this mass is a more subtle area of density with pleomorphic calcifications measuring 1.0 cm. US of the 12:30 o'clock position of the right breast, 2 cm from the nipple, showed an irregular mass measuring 1.9 x 1.6 x 2.2 cm. US of the right axilla revealed normal appearing lymph nodes with preserved fatty hila. Biopsy of the 12:30 o'clock mass on revealed IDC with DCIS, ER / PR +, HER2 -. Biopsy of the UIQ of the right breast revealed IDC with DCIS and calcifications ER/PR +, HER2 -. MRI demonstrated within right breast upper inner quadrant, 3 areas of abnormal enhancement. The LEFT breast demonstrated a 1.3 x 1.6 cm abnormal enhancing spiculated mass. A second area of abnormal enhancement measuring 1.2 x 2.1 cm in the posterior depth. At the medial posterior upper left breast is a 0.9 x 0.7 cm area of abnormal enhancement. Left breast biopsy with ADH.  Final pathology left breast benign, right breast with two foci IDC 2 cm and 0.5 cm. 0/5 nodes.  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 Oncotype low risk. Completed 5 years tamoxifen 2022. Pathology from left breast reduction benign, no atypia.  Prior DD, desires "C". 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  Review of Systems  Objective: Physical Exam  CV: normal heart sounds normal heart rate Pulm: clear to auscultation bilateral  Chest: left breast Wise pattern scar pseudoptosis no masses Right chest deflated implant, patient notes palpable areas which are all consistent with implant shell; animation  noted  Lymph: no palpable axillary adenopathy  Assessment:  R breast cancer Hx gastric bypass S/p left lumpectomy, right mastectomy, dual plane TE/ADM (FlexHD) reconstruction S/p saline implant exchange,lipofilling, left breast reduction  Plan:  Deflation right implant. Patient asks for removal alone. Reviewed in mirror redundant mastectomy flap skin. She does desire excision of this. Reviewed in mirror anticipated contour and this includes continued fullness lateral chest wall.  Reviewed GA, OP surgery, drain placement. Possible capsulectomy based on intra-op exam of this, if thick will excise. Will plan to return pectoralis muscle to chest wall.  Glenna Fellows, MD [facility]  Office/ physician access line after hours [phone]     Natrelle Smooth Round High Profile Saline implant 550 ml, fill volume 590 ml right chest. Total 85 ml fat infiltrated over right chest.

## 2022-06-24 NOTE — Transfer of Care (Signed)
Immediate Anesthesia Transfer of Care Note  Patient: Brooke [name]  Procedure(s) Performed: REMOVAL BREAST IMPLANT (Right: Chest) CAPSULECTOMY (Right: Chest)  Patient Location: PACU  Anesthesia Type:General  Level of Consciousness: awake, alert , and oriented  Airway & Oxygen Therapy: Patient Spontanous Breathing and Patient connected to face mask oxygen  Post-op Assessment: Report given to RN and Post -op Vital signs reviewed and stable  Post vital signs: Reviewed and stable  Last Vitals:  Vitals Value Taken Time  BP 155/93 06/24/22 1437  Temp    Pulse 66 06/24/22 1440  Resp 20 06/24/22 1440  SpO2 100 % 06/24/22 1440  Vitals shown include unvalidated device data.  Last Pain:  Vitals:   06/24/22 1208  TempSrc: Oral  PainSc: 0-No pain         Complications: No notable events documented.

## 2022-06-24 NOTE — Anesthesia Procedure Notes (Signed)
Procedure Name: LMA Insertion Date/Time: 06/24/2022 1:31 PM  Performed by: Lauralyn Primes, CRNAPre-anesthesia Checklist: Patient identified, Emergency Drugs available, Suction available and Patient being monitored Patient Re-evaluated:Patient Re-evaluated prior to induction Oxygen Delivery Method: Circle system utilized Preoxygenation: Pre-oxygenation with 100% oxygen Induction Type: IV induction Ventilation: Mask ventilation without difficulty LMA: LMA inserted LMA Size: 4.0 Number of attempts: 1 Airway Equipment and Method: Bite block Placement Confirmation: positive ETCO2 Tube secured with: Tape Dental Injury: Teeth and Oropharynx as per pre-operative assessment

## 2022-06-24 NOTE — Discharge Instructions (Signed)
May have Tylenol/ Ibuprofen today after 6:15 PM   Post Anesthesia Home Care Instructions  Activity: Get plenty of rest for the remainder of the day. A responsible individual must stay with you for 24 hours following the procedure.  For the next 24 hours, DO NOT: -Drive a car -Advertising copywriter -Drink alcoholic beverages -Take any medication unless instructed by your physician -Make any legal decisions or sign important papers.  Meals: Start with liquid foods such as gelatin or soup. Progress to regular foods as tolerated. Avoid greasy, spicy, heavy foods. If nausea and/or vomiting occur, drink only clear liquids until the nausea and/or vomiting subsides. Call your physician if vomiting continues.  Special Instructions/Symptoms: Your throat may feel dry or sore from the anesthesia or the breathing tube placed in your throat during surgery. If this causes discomfort, gargle with warm salt water. The discomfort should disappear within 24 hours.  If you had a scopolamine patch placed behind your ear for the management of post- operative nausea and/or vomiting:  1. The medication in the patch is effective for 72 hours, after which it should be removed.  Wrap patch in a tissue and discard in the trash. Wash hands thoroughly with soap and water. 2. You may remove the patch earlier than 72 hours if you experience unpleasant side effects which may include dry mouth, dizziness or visual disturbances. 3. Avoid touching the patch. Wash your hands with soap and water after contact with the patch.

## 2022-06-25 ENCOUNTER — Other Ambulatory Visit: Payer: Self-pay | Admitting: Adult Health

## 2022-06-25 DIAGNOSIS — C50211 Malignant neoplasm of upper-inner quadrant of right female breast: Secondary | ICD-10-CM

## 2022-06-27 ENCOUNTER — Encounter (HOSPITAL_BASED_OUTPATIENT_CLINIC_OR_DEPARTMENT_OTHER): Payer: Self-pay | Admitting: Plastic Surgery

## 2022-06-27 ENCOUNTER — Encounter: Payer: Self-pay | Admitting: Adult Health

## 2022-06-27 LAB — SURGICAL PATHOLOGY

## 2022-06-27 MED ORDER — VALACYCLOVIR HCL 500 MG PO TABS
500.0000 mg | ORAL_TABLET | Freq: Every day | ORAL | 0 refills | Status: DC
Start: 2022-06-27 — End: 2022-07-25

## 2022-06-27 MED ORDER — VITAMIN D (ERGOCALCIFEROL) 1.25 MG (50000 UNIT) PO CAPS: ORAL_CAPSULE | ORAL | 0 refills | Status: DC

## 2022-07-04 ENCOUNTER — Inpatient Hospital Stay: Payer: BC Managed Care – PPO | Attending: Adult Health

## 2022-07-23 ENCOUNTER — Other Ambulatory Visit: Payer: Self-pay | Admitting: Adult Health

## 2022-07-23 DIAGNOSIS — C50211 Malignant neoplasm of upper-inner quadrant of right female breast: Secondary | ICD-10-CM

## 2022-08-01 ENCOUNTER — Inpatient Hospital Stay: Payer: BC Managed Care – PPO

## 2022-08-17 ENCOUNTER — Other Ambulatory Visit: Payer: Self-pay | Admitting: Adult Health

## 2022-08-17 DIAGNOSIS — F411 Generalized anxiety disorder: Secondary | ICD-10-CM

## 2022-08-18 ENCOUNTER — Other Ambulatory Visit: Payer: Self-pay

## 2022-08-28 ENCOUNTER — Other Ambulatory Visit: Payer: Self-pay | Admitting: Adult Health

## 2022-08-28 DIAGNOSIS — I10 Essential (primary) hypertension: Secondary | ICD-10-CM

## 2022-08-29 ENCOUNTER — Other Ambulatory Visit: Payer: Self-pay

## 2022-08-29 ENCOUNTER — Encounter: Payer: Self-pay | Admitting: Adult Health

## 2022-08-29 ENCOUNTER — Inpatient Hospital Stay: Payer: BC Managed Care – PPO | Attending: Adult Health

## 2022-08-29 DIAGNOSIS — E538 Deficiency of other specified B group vitamins: Secondary | ICD-10-CM | POA: Insufficient documentation

## 2022-08-29 MED ORDER — CYANOCOBALAMIN 1000 MCG/ML IJ SOLN
1000.0000 ug | Freq: Once | INTRAMUSCULAR | Status: AC
  Administered 2022-08-29: 1000 ug via INTRAMUSCULAR
  Filled 2022-08-29: qty 1

## 2022-09-23 ENCOUNTER — Other Ambulatory Visit: Payer: Self-pay | Admitting: *Deleted

## 2022-09-23 DIAGNOSIS — Z170 Estrogen receptor positive status [ER+]: Secondary | ICD-10-CM

## 2022-09-26 ENCOUNTER — Other Ambulatory Visit: Payer: Self-pay | Admitting: *Deleted

## 2022-09-26 ENCOUNTER — Other Ambulatory Visit: Payer: Self-pay

## 2022-09-26 ENCOUNTER — Encounter: Payer: Self-pay | Admitting: Adult Health

## 2022-09-26 ENCOUNTER — Inpatient Hospital Stay (HOSPITAL_BASED_OUTPATIENT_CLINIC_OR_DEPARTMENT_OTHER): Payer: BC Managed Care – PPO | Admitting: Adult Health

## 2022-09-26 ENCOUNTER — Inpatient Hospital Stay: Payer: BC Managed Care – PPO

## 2022-09-26 ENCOUNTER — Inpatient Hospital Stay: Payer: BC Managed Care – PPO | Attending: Adult Health

## 2022-09-26 VITALS — BP 131/64 | HR 63 | Temp 97.4°F | Resp 18 | Ht 71.0 in | Wt 235.3 lb

## 2022-09-26 DIAGNOSIS — Z853 Personal history of malignant neoplasm of breast: Secondary | ICD-10-CM | POA: Insufficient documentation

## 2022-09-26 DIAGNOSIS — E538 Deficiency of other specified B group vitamins: Secondary | ICD-10-CM | POA: Insufficient documentation

## 2022-09-26 DIAGNOSIS — D649 Anemia, unspecified: Secondary | ICD-10-CM

## 2022-09-26 DIAGNOSIS — C50211 Malignant neoplasm of upper-inner quadrant of right female breast: Secondary | ICD-10-CM | POA: Diagnosis not present

## 2022-09-26 DIAGNOSIS — Z170 Estrogen receptor positive status [ER+]: Secondary | ICD-10-CM | POA: Diagnosis not present

## 2022-09-26 DIAGNOSIS — Z8639 Personal history of other endocrine, nutritional and metabolic disease: Secondary | ICD-10-CM | POA: Insufficient documentation

## 2022-09-26 LAB — CBC WITH DIFFERENTIAL (CANCER CENTER ONLY)
Abs Immature Granulocytes: 0.01 10*3/uL (ref 0.00–0.07)
Basophils Absolute: 0.1 10*3/uL (ref 0.0–0.1)
Basophils Relative: 1 %
Eosinophils Absolute: 0.3 10*3/uL (ref 0.0–0.5)
Eosinophils Relative: 6 %
HCT: 32.7 % — ABNORMAL LOW (ref 36.0–46.0)
Hemoglobin: 10.5 g/dL — ABNORMAL LOW (ref 12.0–15.0)
Immature Granulocytes: 0 %
Lymphocytes Relative: 42 %
Lymphs Abs: 2.1 10*3/uL (ref 0.7–4.0)
MCH: 31.2 pg (ref 26.0–34.0)
MCHC: 32.1 g/dL (ref 30.0–36.0)
MCV: 97 fL (ref 80.0–100.0)
Monocytes Absolute: 0.4 10*3/uL (ref 0.1–1.0)
Monocytes Relative: 8 %
Neutro Abs: 2.2 10*3/uL (ref 1.7–7.7)
Neutrophils Relative %: 43 %
Platelet Count: 285 10*3/uL (ref 150–400)
RBC: 3.37 MIL/uL — ABNORMAL LOW (ref 3.87–5.11)
RDW: 14.8 % (ref 11.5–15.5)
WBC Count: 5.1 10*3/uL (ref 4.0–10.5)
nRBC: 0 % (ref 0.0–0.2)

## 2022-09-26 LAB — CMP (CANCER CENTER ONLY)
ALT: 14 U/L (ref 0–44)
AST: 19 U/L (ref 15–41)
Albumin: 4 g/dL (ref 3.5–5.0)
Alkaline Phosphatase: 83 U/L (ref 38–126)
Anion gap: 5 (ref 5–15)
BUN: 15 mg/dL (ref 6–20)
CO2: 27 mmol/L (ref 22–32)
Calcium: 8.8 mg/dL — ABNORMAL LOW (ref 8.9–10.3)
Chloride: 108 mmol/L (ref 98–111)
Creatinine: 0.92 mg/dL (ref 0.44–1.00)
GFR, Estimated: >60 mL/min (ref >60)
Glucose, Bld: 118 mg/dL — ABNORMAL HIGH (ref 70–99)
Potassium: 3.7 mmol/L (ref 3.5–5.1)
Sodium: 140 mmol/L (ref 135–145)
Total Bilirubin: 0.3 mg/dL (ref 0.3–1.2)
Total Protein: 6.6 g/dL (ref 6.5–8.1)

## 2022-09-26 LAB — VITAMIN D 25 HYDROXY (VIT D DEFICIENCY, FRACTURES): Vit D, 25-Hydroxy: 42.17 ng/mL (ref 30–100)

## 2022-09-26 LAB — VITAMIN B12: Vitamin B-12: 200 pg/mL (ref 180–914)

## 2022-09-26 MED ORDER — CYANOCOBALAMIN 1000 MCG/ML IJ SOLN
1000.0000 ug | Freq: Once | INTRAMUSCULAR | Status: AC
  Administered 2022-09-26: 1000 ug via INTRAMUSCULAR
  Filled 2022-09-26: qty 1

## 2022-09-26 NOTE — Assessment & Plan Note (Signed)
Brooke [name] is a 57 year old woman with history of stage IIa right-sided breast cancer ER/PR positive diagnosed in February 2017 status post mastectomy, unable to tolerate antiestrogen therapy.  Majel has no clinical or radiographic sign of breast cancer recurrence.  She will continue with annual mammograms next due in May 2025 along with annual chest wall exam which was benign today and can be repeated in 1 year.  I reviewed healthy diet and exercise with her in detail today.  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  Will see Evgenia back every 4 weeks for B12 injections and in 6 months for lab and follow-up as well.

## 2022-09-26 NOTE — Progress Notes (Signed)
[facility] Cancer Follow up:    Brooke Confer, NP [address] [place] [address]   DIAGNOSIS: B12 deficiency, history of stage IIA breast cancer  SUMMARY OF ONCOLOGIC HISTORY: Oncology History  Malignant neoplasm of upper-inner quadrant of right breast in female, estrogen receptor positive (HCC)  02/12/2015 Mammogram   Right mammogram: UIQ of the right breast, anterior depth, spiculated mass with pleomorphic calcifications measuring ~1.5 cm mammographically. Approximately 5.5 cm posterior to this mass, there is a more subtle area of density with pleomorphic calcs   02/17/2015 Initial Biopsy   Right breast core needle bx: 2 lesions (separated by 10 cm): invasive ductal carcinoma, ER+, PR+, Ki67 25%, HER2/neu negative   02/17/2015 Clinical Stage   Stage II: T2 Nx   02/25/2015 -  Neo-Adjuvant Anti-estrogen oral therapy   Tamoxifen 20 mg began; held 05/2015 due to intolerable side effects   03/06/2015 Procedure   Breast/ Ovarian cancer panel (Gene[facility]): no deleterious mutations at ATM, BARD1, BRCA1, BRCA2, BRIP1, CDH1, CHEK2, FANCC, MLH1, MSH2, MSH6, NBN, PALB2, PMS2, PTEN, RAD51C, RAD51D, TP53, and XRCC2.  VUS called "c.662T>C (p.Ile221Thr)" found XRCC2.   03/10/2015 Procedure   Left breast bx: ADH   03/23/2015 Surgery   Left lumpectomy: ductal hyperplasia   04/06/2015 Definitive Surgery   Right mastectomy/SLNB: invasive ductal carcinoma, grade 2, negative margins, repeat HER2/neu negative   04/06/2015 Pathologic Stage   Stage IA: pT1c N0   04/06/2015 Oncotype testing   RS 5 (5% ROR)   06/22/2015 Survivorship   SCP mailed to patient at her request in lieu of in person visit     CURRENT THERAPY: b12 injections  INTERVAL HISTORY: Brooke [name] 57 y.o. female returns for follow-up and evaluation of her B12 deficiency and history of right breast cancer.  She continues to receive B12 injections monthly.  She did miss B12 injections in May, June, July.  Her  most recent injection occurred on August 2024.  Her most recent left breast mammogram occurred on May 13, 2022 demonstrating no mammographic evidence of malignancy and breast density category B.  Of note she has undergone right mastectomy.  Patient Active Problem List   Diagnosis Date Noted   B12 deficiency 07/24/2021   Major depressive disorder, recurrent episode, mild (HCC) 06/26/2019   GAD (generalized anxiety disorder) 06/26/2019   Alcohol use disorder, mild, abuse 06/26/2019   Genetic testing 03/09/2015   Malignant neoplasm of upper-inner quadrant of right breast in female, estrogen receptor positive (HCC) 02/18/2015   Chronic headaches     has No Known Allergies.  MEDICAL HISTORY: Past Medical History:  Diagnosis Date   Alcohol use disorder, mild, abuse 06/26/2019   Anemia    Anxiety    Breast cancer (HCC)    Breast cancer of upper-inner quadrant of right female breast (HCC) 02/18/2015   Chronic headaches    Chronic headaches    Depression    Fatigue    Hot flashes    Hypertension    Yeast infection     SURGICAL HISTORY: Past Surgical History:  Procedure Laterality Date   BREAST IMPLANT REMOVAL Right 06/24/2022   Procedure: REMOVAL BREAST IMPLANT;  Surgeon: Glenna Fellows, MD;  Location:  SURGERY CENTER;  Service: Plastics;  Laterality: Right;   BREAST LUMPECTOMY Left 2017   BREAST LUMPECTOMY WITH RADIOACTIVE SEED LOCALIZATION Left 04/06/2015   BREAST LUMPECTOMY WITH RADIOACTIVE SEED LOCALIZATION Left 04/06/2015   Procedure: LEFT BREAST LUMPECTOMY WITH RADIOACTIVE SEED LOCALIZATION;  Surgeon:  Ovidio Kin, MD;  Location: [facility] OR;  Service: General;  Laterality: Left;   BREAST RECONSTRUCTION WITH PLACEMENT OF TISSUE EXPANDER AND FLEX HD (ACELLULAR HYDRATED DERMIS) Right 04/06/2015   Procedure: RIGHT BREAST RECONSTRUCTION WITH PLACEMENT OF TISSUE EXPANDER, POSSIBLE ACELLULAR DERMIS;  Surgeon: Glenna Fellows, MD;  Location: [facility] OR;  Service: Plastics;   Laterality: Right;   BREAST REDUCTION SURGERY Left 08/03/2015   Procedure: LEFT BREAST REDUCTION FOR SYMMETRY  (BREAST);  Surgeon: Glenna Fellows, MD;  Location: [facility];  Service: Plastics;  Laterality: Left;   CAPSULECTOMY Right 06/24/2022   Procedure: CAPSULECTOMY;  Surgeon: Glenna Fellows, MD;  Location: [facility];  Service: Plastics;  Laterality: Right;   CHOLECYSTECTOMY     GASTRIC BYPASS     LIPOSUCTION WITH LIPOFILLING N/A 08/03/2015   Procedure: LIPOSUCTION WITH LIPOFILLING ;  Surgeon: Glenna Fellows, MD;  Location: [facility];  Service: Plastics;  Laterality: N/A;   MASTECTOMY Right 2017   MASTECTOMY W/ SENTINEL NODE BIOPSY Right    MASTECTOMY W/ SENTINEL NODE BIOPSY Right 04/06/2015   Procedure: RIGHT MASTECTOMY WITH SENTINEL LYMPH NODE BIOPSY;  Surgeon: Ovidio Kin, MD;  Location: [facility] OR;  Service: General;  Laterality: Right;   REDUCTION MAMMAPLASTY Left 2017   REMOVAL OF BILATERAL TISSUE EXPANDERS WITH PLACEMENT OF BILATERAL BREAST IMPLANTS Right 08/03/2015   Procedure: REMOVAL OF RIGHT TISSUE EXPANDERS WITH PLACEMENT OF IMPLANT;  Surgeon: Glenna Fellows, MD;  Location: [facility];  Service: Plastics;  Laterality: Right;   TUBAL LIGATION      SOCIAL HISTORY: Social History   Socioeconomic History   Marital status: Married    Spouse name: Not on file   Number of children: 2   Years of education: Not on file   Highest education level: Not on file  Occupational History   Not on file  Tobacco Use   Smoking status: Never   Smokeless tobacco: Never  Substance and Sexual Activity   Alcohol use: Yes    Alcohol/week: 4.0 - 5.0 standard drinks of alcohol    Types: 4 - 5 Standard drinks or equivalent per week    Comment: wine and beer on weekends   Drug use: No   Sexual activity: Yes    Birth control/protection: Surgical  Other Topics Concern   Not on file  Social History Narrative   Not on file    Social Determinants of Health   Financial Resource Strain: Not on file  Food Insecurity: Not on file  Transportation Needs: Not on file  Physical Activity: Not on file  Stress: Not on file  Social Connections: Unknown (05/14/2021)   Received from [facility]   Social Network    Social Network: Not on file  Intimate Partner Violence: Unknown (04/06/2021)   [facility]   HITS    Physically Hurt: Not on file    Insult or Talk Down To: Not on file    Threaten Physical Harm: Not on file    Scream or Curse: Not on file    FAMILY HISTORY: Family History  Problem Relation Age of Onset   Diabetes Mother    Hypertension Mother    Spinal muscular atrophy Mother    Irregular heart beat Mother    Anxiety disorder Mother    Arthritis Mother    Cancer Other        maternal great grandfather dx. unspecified type cancer   Heart attack Maternal Grandmother  Heart attack Maternal Uncle    Cancer Cousin        paternal 1st cousin dx. with cancer that was typically a childhood cancer   Breast cancer Neg Hx     Review of Systems  Constitutional:  Positive for fatigue. Negative for appetite change, chills, fever and unexpected weight change.  HENT:   Negative for hearing loss, lump/mass and trouble swallowing.   Eyes:  Negative for eye problems and icterus.  Respiratory:  Negative for chest tightness, cough and shortness of breath.   Cardiovascular:  Negative for chest pain, leg swelling and palpitations.  Gastrointestinal:  Negative for abdominal distention, abdominal pain, constipation, diarrhea, nausea and vomiting.  Endocrine: Negative for hot flashes.  Genitourinary:  Negative for difficulty urinating.   Musculoskeletal:  Negative for arthralgias.  Skin:  Negative for itching and rash.  Neurological:  Negative for dizziness, extremity weakness, headaches and numbness.  Hematological:  Negative for adenopathy. Does not bruise/bleed  easily.  Psychiatric/Behavioral:  Negative for depression. The patient is not nervous/anxious.       PHYSICAL EXAMINATION    Vitals:   09/26/22 1503  BP: 131/64  Pulse: 63  Resp: 18  Temp: (!) 97.4 F (36.3 C)  SpO2: 100%    Physical Exam Constitutional:      General: She is not in acute distress.    Appearance: Normal appearance. She is not toxic-appearing.  HENT:     Head: Normocephalic and atraumatic.     Mouth/Throat:     Mouth: Mucous membranes are moist.     Pharynx: Oropharynx is clear. No oropharyngeal exudate or posterior oropharyngeal erythema.  Eyes:     General: No scleral icterus. Cardiovascular:     Rate and Rhythm: Normal rate and regular rhythm.     Pulses: Normal pulses.     Heart sounds: Normal heart sounds.  Pulmonary:     Effort: Pulmonary effort is normal.     Breath sounds: Normal breath sounds.  Chest:     Comments: Right breast s/p mastectomy, no sign of local recurrence, left breast s/p reduction, benign Abdominal:     General: Abdomen is flat. Bowel sounds are normal. There is no distension.     Palpations: Abdomen is soft.     Tenderness: There is no abdominal tenderness.  Musculoskeletal:        General: No swelling.     Cervical back: Neck supple.  Lymphadenopathy:     Cervical: No cervical adenopathy.  Skin:    General: Skin is warm and dry.     Findings: No rash.  Neurological:     General: No focal deficit present.     Mental Status: She is alert.  Psychiatric:        Mood and Affect: Mood normal.        Behavior: Behavior normal.     LABORATORY DATA:  CBC    Component Value Date/Time   WBC 5.1 09/26/2022 1448   WBC 6.0 12/02/2020 1240   RBC 3.37 (L) 09/26/2022 1448   HGB 10.5 (L) 09/26/2022 1448   HGB 12.2 06/22/2016 0858   HCT 32.7 (L) 09/26/2022 1448   HCT 37.5 06/22/2016 0858   PLT 285 09/26/2022 1448   PLT 305 06/22/2016 0858   MCV 97.0 09/26/2022 1448   MCV 93.8 06/22/2016 0858   MCH 31.2 09/26/2022 1448    MCHC 32.1 09/26/2022 1448   RDW 14.8 09/26/2022 1448   RDW 14.4 06/22/2016 0858   LYMPHSABS 2.1 09/26/2022  1448   LYMPHSABS 2.0 06/22/2016 0858   MONOABS 0.4 09/26/2022 1448   MONOABS 0.3 06/22/2016 0858   EOSABS 0.3 09/26/2022 1448   EOSABS 0.2 06/22/2016 0858   BASOSABS 0.1 09/26/2022 1448   BASOSABS 0.1 06/22/2016 0858    CMP     Component Value Date/Time   NA 140 09/26/2022 1448   NA 141 06/22/2016 0858   K 3.7 09/26/2022 1448   K 4.4 06/22/2016 0858   CL 108 09/26/2022 1448   CO2 27 09/26/2022 1448   CO2 22 06/22/2016 0858   GLUCOSE 118 (H) 09/26/2022 1448   GLUCOSE 110 06/22/2016 0858   BUN 15 09/26/2022 1448   BUN 11.8 06/22/2016 0858   CREATININE 0.92 09/26/2022 1448   CREATININE 0.8 06/22/2016 0858   CALCIUM 8.8 (L) 09/26/2022 1448   CALCIUM 9.1 06/22/2016 0858   PROT 6.6 09/26/2022 1448   PROT 7.1 06/22/2016 0858   ALBUMIN 4.0 09/26/2022 1448   ALBUMIN 3.5 06/22/2016 0858   AST 19 09/26/2022 1448   AST 48 (H) 06/22/2016 0858   ALT 14 09/26/2022 1448   ALT 35 06/22/2016 0858   ALKPHOS 83 09/26/2022 1448   ALKPHOS 109 06/22/2016 0858   BILITOT 0.3 09/26/2022 1448   BILITOT 0.78 06/22/2016 0858   GFRNONAA >60 09/26/2022 1448   GFRNONAA >89 03/14/2014 1354   GFRAA >60 05/23/2018 0912   GFRAA >89 03/14/2014 1354         ASSESSMENT and THERAPY PLAN:   Malignant neoplasm of upper-inner quadrant of right breast in female, estrogen receptor positive (HCC) Brooke [name] is a 57 year old woman with history of stage IIa right-sided breast cancer ER/PR positive diagnosed in February 2017 status post mastectomy, unable to tolerate antiestrogen therapy.  Brooke [name] has no clinical or radiographic sign of breast cancer recurrence.  She will continue with annual mammograms next due in May 2025 along with annual chest wall exam which was benign today and can be repeated in 1 year.  I reviewed healthy diet and exercise with her in detail today.  She has significant  neuralgias in her knees and is scheduled to see Ortho later this week.  Will see Brooke [name] back every 4 weeks for B12 injections and in 6 months for lab and follow-up as well.  B12 deficiency Continue B12 injections.  Repeat b12 and vitamin d levels today.    Her fatigue is likely secondary to missing injections x 3-4 months.  I do not see an indication for her to take oral iron in her lab testing.    I gave her information on obtaining a PCP [email or web address].   RTC every 4 weeks for injection, will repeat labs and f/u with her in approximately 6 months.     All questions were answered. The patient knows to call the clinic with any problems, questions or concerns. We can certainly see the patient much sooner if necessary.  Total encounter time:30 minutes*in face-to-face visit time, chart review, lab review, care coordination, order entry, and documentation of the encounter time.    Lillard Anes, NP 09/26/22 3:45 PM Medical Oncology and Hematology [facility] [address] [place], [place] [address] Tel. [phone]    Fax. [phone]  *Total Encounter Time as defined by the Centers for Medicare and Medicaid Services includes, in addition to the face-to-face time of a patient visit (documented in the note above) non-face-to-face time: obtaining and reviewing outside history, ordering and reviewing medications, tests or procedures, care coordination (communications with other health  care professionals or caregivers) and documentation in the medical record.

## 2022-09-26 NOTE — Assessment & Plan Note (Signed)
Continue B12 injections.  Repeat b12 and vitamin d levels today.    Her fatigue is likely secondary to missing injections x 3-4 months.  I do not see an indication for her to take oral iron in her lab testing.    I gave her information on obtaining a PCP [email or web address].   RTC every 4 weeks for injection, will repeat labs and f/u with her in approximately 6 months.

## 2022-09-28 ENCOUNTER — Telehealth: Payer: Self-pay | Admitting: Adult Health

## 2022-09-28 ENCOUNTER — Telehealth: Payer: Self-pay

## 2022-09-28 NOTE — Telephone Encounter (Signed)
Per Lillard Anes, DNP, called pt with message below. Pt verbalized understanding. Scheduling message sent.

## 2022-09-28 NOTE — Telephone Encounter (Signed)
-----   Message from Noreene Filbert sent at 09/27/2022 10:45 PM EDT ----- Let Synetta Fail know her vitamin d level is good-she can switch to Vitamin D3 2000 international units daily (OTC).  Her b12 is lower than we'd like, probably since she missed three shots.  I anticipate we will see an improvement when we repeat labs as scheduled in 6 injections. ----- Message ----- From: Leory Plowman, Lab In [place] Sent: 09/26/2022   5:14 PM EDT To: [name], NP

## 2022-09-28 NOTE — Telephone Encounter (Signed)
This RN contacted pt and LVM to please call back to discuss her recent lab results. Call back number [phone] provided.

## 2022-10-21 ENCOUNTER — Other Ambulatory Visit: Payer: Self-pay | Admitting: *Deleted

## 2022-10-21 DIAGNOSIS — E538 Deficiency of other specified B group vitamins: Secondary | ICD-10-CM

## 2022-10-24 ENCOUNTER — Inpatient Hospital Stay: Payer: BC Managed Care – PPO | Attending: Adult Health

## 2022-10-24 ENCOUNTER — Inpatient Hospital Stay: Payer: BC Managed Care – PPO

## 2022-10-24 VITALS — BP 144/92 | HR 59 | Temp 98.5°F | Resp 18

## 2022-10-24 DIAGNOSIS — E538 Deficiency of other specified B group vitamins: Secondary | ICD-10-CM | POA: Diagnosis present

## 2022-10-24 DIAGNOSIS — E559 Vitamin D deficiency, unspecified: Secondary | ICD-10-CM | POA: Insufficient documentation

## 2022-10-24 LAB — CBC WITH DIFFERENTIAL (CANCER CENTER ONLY)
Abs Immature Granulocytes: 0.01 10*3/uL (ref 0.00–0.07)
Basophils Absolute: 0.1 10*3/uL (ref 0.0–0.1)
Basophils Relative: 2 %
Eosinophils Absolute: 0.3 10*3/uL (ref 0.0–0.5)
Eosinophils Relative: 9 %
HCT: 36.6 % (ref 36.0–46.0)
Hemoglobin: 11.9 g/dL — ABNORMAL LOW (ref 12.0–15.0)
Immature Granulocytes: 0 %
Lymphocytes Relative: 51 %
Lymphs Abs: 1.9 10*3/uL (ref 0.7–4.0)
MCH: 31 pg (ref 26.0–34.0)
MCHC: 32.5 g/dL (ref 30.0–36.0)
MCV: 95.3 fL (ref 80.0–100.0)
Monocytes Absolute: 0.3 10*3/uL (ref 0.1–1.0)
Monocytes Relative: 8 %
Neutro Abs: 1.1 10*3/uL — ABNORMAL LOW (ref 1.7–7.7)
Neutrophils Relative %: 30 %
Platelet Count: 329 10*3/uL (ref 150–400)
RBC: 3.84 MIL/uL — ABNORMAL LOW (ref 3.87–5.11)
RDW: 14.2 % (ref 11.5–15.5)
WBC Count: 3.7 10*3/uL — ABNORMAL LOW (ref 4.0–10.5)
nRBC: 0 % (ref 0.0–0.2)

## 2022-10-24 LAB — CMP (CANCER CENTER ONLY)
ALT: 18 U/L (ref 0–44)
AST: 26 U/L (ref 15–41)
Albumin: 4.3 g/dL (ref 3.5–5.0)
Alkaline Phosphatase: 93 U/L (ref 38–126)
Anion gap: 8 (ref 5–15)
BUN: 10 mg/dL (ref 6–20)
CO2: 27 mmol/L (ref 22–32)
Calcium: 9.1 mg/dL (ref 8.9–10.3)
Chloride: 106 mmol/L (ref 98–111)
Creatinine: 0.93 mg/dL (ref 0.44–1.00)
GFR, Estimated: >60 mL/min (ref >60)
Glucose, Bld: 97 mg/dL (ref 70–99)
Potassium: 4.2 mmol/L (ref 3.5–5.1)
Sodium: 141 mmol/L (ref 135–145)
Total Bilirubin: 0.2 mg/dL — ABNORMAL LOW (ref 0.3–1.2)
Total Protein: 7.4 g/dL (ref 6.5–8.1)

## 2022-10-24 LAB — VITAMIN B12: Vitamin B-12: 220 pg/mL (ref 180–914)

## 2022-10-24 LAB — VITAMIN D 25 HYDROXY (VIT D DEFICIENCY, FRACTURES): Vit D, 25-Hydroxy: 39.56 ng/mL (ref 30–100)

## 2022-10-24 MED ORDER — CYANOCOBALAMIN 1000 MCG/ML IJ SOLN
1000.0000 ug | Freq: Once | INTRAMUSCULAR | Status: AC
  Administered 2022-10-24: 1000 ug via INTRAMUSCULAR
  Filled 2022-10-24: qty 1

## 2022-11-04 DIAGNOSIS — R55 Syncope and collapse: Secondary | ICD-10-CM

## 2022-11-04 HISTORY — DX: Syncope and collapse: R55

## 2022-11-16 ENCOUNTER — Ambulatory Visit (HOSPITAL_COMMUNITY)
Admission: EM | Admit: 2022-11-16 | Discharge: 2022-11-16 | Discharge status: Against Medical Advice | Payer: BC Managed Care – PPO

## 2022-11-17 ENCOUNTER — Emergency Department (HOSPITAL_COMMUNITY)
Admission: EM | Admit: 2022-11-17 | Discharge: 2022-11-17 | Disposition: A | Discharge status: Home / Self Care | Payer: BC Managed Care – PPO

## 2022-11-17 ENCOUNTER — Other Ambulatory Visit: Payer: Self-pay

## 2022-11-17 ENCOUNTER — Encounter (HOSPITAL_COMMUNITY): Payer: Self-pay

## 2022-11-17 ENCOUNTER — Emergency Department (HOSPITAL_COMMUNITY): Payer: BC Managed Care – PPO

## 2022-11-17 DIAGNOSIS — R112 Nausea with vomiting, unspecified: Secondary | ICD-10-CM | POA: Insufficient documentation

## 2022-11-17 DIAGNOSIS — R55 Syncope and collapse: Secondary | ICD-10-CM | POA: Insufficient documentation

## 2022-11-17 LAB — CBC WITH DIFFERENTIAL/PLATELET
Abs Immature Granulocytes: 0.01 10*3/uL (ref 0.00–0.07)
Basophils Absolute: 0.1 10*3/uL (ref 0.0–0.1)
Basophils Relative: 1 %
Eosinophils Absolute: 0.2 10*3/uL (ref 0.0–0.5)
Eosinophils Relative: 3 %
HCT: 38.6 % (ref 36.0–46.0)
Hemoglobin: 13 g/dL (ref 12.0–15.0)
Immature Granulocytes: 0 %
Lymphocytes Relative: 41 %
Lymphs Abs: 2.3 10*3/uL (ref 0.7–4.0)
MCH: 31.6 pg (ref 26.0–34.0)
MCHC: 33.7 g/dL (ref 30.0–36.0)
MCV: 93.9 fL (ref 80.0–100.0)
Monocytes Absolute: 0.4 10*3/uL (ref 0.1–1.0)
Monocytes Relative: 6 %
Neutro Abs: 2.7 10*3/uL (ref 1.7–7.7)
Neutrophils Relative %: 49 %
Platelets: 290 10*3/uL (ref 150–400)
RBC: 4.11 MIL/uL (ref 3.87–5.11)
RDW: 14.6 % (ref 11.5–15.5)
WBC: 5.6 10*3/uL (ref 4.0–10.5)
nRBC: 0 % (ref 0.0–0.2)

## 2022-11-17 LAB — COMPREHENSIVE METABOLIC PANEL
ALT: 43 U/L (ref 0–44)
AST: 67 U/L — ABNORMAL HIGH (ref 15–41)
Albumin: 3.7 g/dL (ref 3.5–5.0)
Alkaline Phosphatase: 111 U/L (ref 38–126)
Anion gap: 12 (ref 5–15)
BUN: 13 mg/dL (ref 6–20)
CO2: 24 mmol/L (ref 22–32)
Calcium: 8.3 mg/dL — ABNORMAL LOW (ref 8.9–10.3)
Chloride: 103 mmol/L (ref 98–111)
Creatinine, Ser: 0.83 mg/dL (ref 0.44–1.00)
GFR, Estimated: >60 mL/min (ref >60)
Glucose, Bld: 111 mg/dL — ABNORMAL HIGH (ref 70–99)
Potassium: 4.2 mmol/L (ref 3.5–5.1)
Sodium: 139 mmol/L (ref 135–145)
Total Bilirubin: 0.6 mg/dL (ref <1.2)
Total Protein: 7 g/dL (ref 6.5–8.1)

## 2022-11-17 LAB — LIPASE, BLOOD: Lipase: 27 U/L (ref 11–51)

## 2022-11-17 LAB — CBG MONITORING, ED: Glucose-Capillary: 133 mg/dL — ABNORMAL HIGH (ref 70–99)

## 2022-11-17 MED ORDER — SODIUM CHLORIDE 0.9 % IV BOLUS
1000.0000 mL | Freq: Once | INTRAVENOUS | Status: AC
  Administered 2022-11-17: 1000 mL via INTRAVENOUS

## 2022-11-17 MED ORDER — ONDANSETRON HCL 4 MG PO TABS: 4.0000 mg | ORAL_TABLET | Freq: Four times a day (QID) | ORAL | 0 refills | Status: DC

## 2022-11-17 NOTE — Discharge Instructions (Addendum)
Please try to maintain adequate hydration with frequent sips of liquids.  You may wear compression stockings as well.  We are prescribing you nausea medication to take as needed.  Your AST liver enzyme was elevated, it is unclear why.  However, we feel that she can follow-up with your primary doctor for further workup.  As discussed, please follow-up with your cardiologist as planned.  Return immediately for fevers, chills, chest pain, shortness of breath, passout again, abdominal pain or any new or worsening symptoms that are concerning to you.

## 2022-11-17 NOTE — ED Triage Notes (Signed)
Pt BIB EMS from home. Pt hasn't been feeling well for 24 hrs. Pt was sitting on the couch, stood up, felt dizzy, and fell to the floor per husband. Pt was 2-3 mins, no blood thinners, pt did not it her head.

## 2022-11-17 NOTE — ED Provider Notes (Signed)
Brooke [name] AT [facility] Provider Note   CSN: [id number] Arrival date & time: 11/17/22  1900     History  Chief Complaint  Patient presents with   Loss of Consciousness    Brooke [name] is a 57 y.o. female.  This is a 57 year old female presenting emergency [name] after syncopal episode this evening.  Stood up, felt lightheaded and had syncopal episode.  Was reportedly unresponsive for a minute or 2 per husband.  She is not on any blood thinners.  She reports generalized malaise, decreased appetite for the past several weeks.  Notes some intermittent nausea and vomiting.  She had some nausea and vomiting today.  No abdominal pain.  Denies chest pain, palpitations or shortness of breath.   Loss of Consciousness      Home Medications Prior to Admission medications   Medication Sig Start Date End Date Taking? Authorizing Provider  ondansetron (ZOFRAN) 4 MG tablet Take 1 tablet (4 mg total) by mouth every 6 (six) hours. 11/17/22  Yes Brooke Spikes, DO  triamterene-hydrochlorothiazide (DYAZIDE) 37.5-25 MG capsule TAKE 1 CAPSULE BY MOUTH EVERY DAY 08/29/22   Causey, Larna Daughters, NP  valACYclovir (VALTREX) 500 MG tablet TAKE 1 TABLET (500 MG TOTAL) BY MOUTH DAILY. 07/25/22   [name], NP  venlafaxine XR (EFFEXOR-XR) 37.5 MG 24 hr capsule TAKE 1 CAPSULE BY MOUTH DAILY WITH BREAKFAST. 08/18/22   [name], NP  Vitamin D, Ergocalciferol, (DRISDOL) 1.25 MG (50000 UNIT) CAPS capsule TAKE 1 CAPSULE BY MOUTH ONE TIME PER WEEK 06/27/22   [name], NP      Allergies    Patient has no known allergies.    Review of Systems   Review of Systems  Cardiovascular:  Positive for syncope.    Physical Exam Updated Vital Signs BP (!) 150/85   Pulse 65   Temp 97.7 F (36.5 C) (Oral)   Resp 14   Ht 5\' 11"  (1.803 m)   Wt 106.7 kg   LMP 04/05/2016 (Approximate)   SpO2 100%   BMI 32.81 kg/m  Physical  Exam Vitals and nursing note reviewed.  Constitutional:      General: She is not in acute distress.    Appearance: She is not toxic-appearing.  HENT:     Nose: Nose normal.     Mouth/Throat:     Mouth: Mucous membranes are dry.  Eyes:     Conjunctiva/sclera: Conjunctivae normal.  Cardiovascular:     Rate and Rhythm: Normal rate and regular rhythm.  Pulmonary:     Effort: Pulmonary effort is normal.  Abdominal:     General: Abdomen is flat. There is no distension.     Tenderness: There is no abdominal tenderness. There is no guarding or rebound.  Musculoskeletal:        General: Normal range of motion.  Skin:    General: Skin is warm.     Capillary Refill: Capillary refill takes less than 2 seconds.  Neurological:     Mental Status: She is alert and oriented to person, place, and time.  Psychiatric:        Mood and Affect: Mood normal.        Behavior: Behavior normal.     ED Results / Procedures / Treatments   Labs (all labs ordered are listed, but only abnormal results are displayed) Labs Reviewed  COMPREHENSIVE METABOLIC PANEL - Abnormal; Notable for the following components:      Result Value  Glucose, Bld 111 (*)    Calcium 8.3 (*)    AST 67 (*)    All other components within normal limits  CBG MONITORING, ED - Abnormal; Notable for the following components:   Glucose-Capillary 133 (*)    All other components within normal limits  CBC WITH DIFFERENTIAL/PLATELET  LIPASE, BLOOD    EKG EKG Interpretation Date/Time:  Thursday November 17 2022 19:46:00 EST Ventricular Rate:  65 PR Interval:  171 QRS Duration:  99 QT Interval:  469 QTC Calculation: 488 R Axis:   -18  Text Interpretation: Sinus rhythm Consider left atrial enlargement Abnormal R-wave progression, early transition Left ventricular hypertrophy Borderline prolonged QT interval Confirmed by Estanislado Pandy [phone]) on 11/17/2022 11:15:10 PM  Radiology DG Chest Portable 1 View  Result Date:  11/17/2022 CLINICAL DATA:  Dizziness and syncopal episode, not feeling well for the past day. EXAM: PORTABLE CHEST 1 VIEW COMPARISON:  PA chest 05/03/2012 FINDINGS: The heart size and mediastinal contours are within normal limits. Both lungs are clear. The visualized skeletal structures are unremarkable. Multiple overlying monitor wires. IMPRESSION: No evidence of acute chest disease or interval changes. Electronically Signed   By: Almira Bar M.D.   On: 11/17/2022 22:34    Procedures Procedures    Medications Ordered in ED Medications  sodium chloride 0.9 % bolus 1,000 mL (0 mLs Intravenous Stopped 11/17/22 2224)    ED Course/ Medical Decision Making/ A&P Clinical Course as of 11/17/22 2350  Thu Nov 17, 2022  2017 Per chart review from PCP visit yesterday :"Patient reports unwitnessed syncopal at her home last week. She reports standing up after being seated on her couch when she had LOC and fell to the floor. She thinks that LOC was one minute or less. She will have labs in follow-up of her syncopal episode with STAT referral to Cardiology. EKG with possible OT prolongation but otherwise no acute abnormality. She has been instructed to call 911/go to ED if future syncopal episode." [TY]  2321 Patient feeling improved after fluids.  She is orthostatic positive.  EKG normal sinus rhythm with no ST segment changes to indicate ischemia.  Not having chest pain.  Chest x-ray without pneumonia pneumothorax or acute findings.  CBC without anemia.  No leukocytosis to suggest systemic infection.  Comprehensive metabolic panel with minor elevation in AST.  She is not having any abdominal pain.  No right upper quadrant tenderness.  She has been having some intermittent nausea vomiting.  Unclear etiology.  However, feel that she can follow-up outpatient as she is tolerating p.o. currently.  Lipase not elevated.  Pancreatitis unlikely.  Will discharge in stable condition.  She has cardiology appointment on  Monday. [TY]    Clinical Course User Index [TY] Brooke Spikes, DO                                 Medical Decision Making This is a 57 year old female presenting emergency [name] after a syncopal episode.  Suspect orthostatic/vasovagal given history of decreased p.o. intake with nausea vomiting.  She is afebrile nontachycardic and normotensive.  Clinically with dry mucous membranes.  Will get orthostatic vital signs.  IV fluids.  Will get basic labs to include evaluation of her liver enzymes and lipase for underlying cause of her nausea vomiting.  She has a soft benign nontender abdomen on exam however.  Low suspicion for acute intra-abdominal pathology.  Will forego CT scan  at this time. See ED course for further mdm and final disposition.   Amount and/or Complexity of Data Reviewed Independent Historian:     Details: Spouse notes that she had a prior evaluation at urgent care after syncopal episode last week and has cardiology appointment scheduled for Monday. Labs: ordered. Radiology: ordered. ECG/medicine tests: ordered.  Risk Prescription drug management.         Final Clinical Impression(s) / ED Diagnoses Final diagnoses:  Syncope, unspecified syncope type    Rx / DC Orders ED Discharge Orders          Ordered    ondansetron (ZOFRAN) 4 MG tablet  Every 6 hours        11/17/22 2324              Brooke Spikes, DO 11/17/22 2350

## 2022-11-21 ENCOUNTER — Ambulatory Visit (INDEPENDENT_AMBULATORY_CARE_PROVIDER_SITE_OTHER): Payer: BC Managed Care – PPO

## 2022-11-21 ENCOUNTER — Inpatient Hospital Stay: Payer: BC Managed Care – PPO | Attending: Adult Health

## 2022-11-21 ENCOUNTER — Ambulatory Visit: Payer: BC Managed Care – PPO | Attending: Internal Medicine | Admitting: Internal Medicine

## 2022-11-21 ENCOUNTER — Inpatient Hospital Stay: Payer: BC Managed Care – PPO

## 2022-11-21 ENCOUNTER — Encounter: Payer: Self-pay | Admitting: Internal Medicine

## 2022-11-21 ENCOUNTER — Other Ambulatory Visit: Payer: Self-pay

## 2022-11-21 VITALS — BP 141/97 | HR 85 | Ht 71.0 in | Wt 217.0 lb

## 2022-11-21 DIAGNOSIS — R9431 Abnormal electrocardiogram [ECG] [EKG]: Secondary | ICD-10-CM | POA: Diagnosis not present

## 2022-11-21 DIAGNOSIS — I517 Cardiomegaly: Secondary | ICD-10-CM

## 2022-11-21 DIAGNOSIS — R55 Syncope and collapse: Secondary | ICD-10-CM

## 2022-11-21 DIAGNOSIS — Z8669 Personal history of other diseases of the nervous system and sense organs: Secondary | ICD-10-CM | POA: Diagnosis not present

## 2022-11-21 DIAGNOSIS — E538 Deficiency of other specified B group vitamins: Secondary | ICD-10-CM | POA: Diagnosis present

## 2022-11-21 MED ORDER — CYANOCOBALAMIN 1000 MCG/ML IJ SOLN
1000.0000 ug | Freq: Once | INTRAMUSCULAR | Status: AC
  Administered 2022-11-21: 1000 ug via INTRAMUSCULAR
  Filled 2022-11-21: qty 1

## 2022-11-21 NOTE — Progress Notes (Unsigned)
[employer] [id number] from office inventory applied to patient.

## 2022-11-21 NOTE — Progress Notes (Signed)
Cardiology Office Note:  .    Date:  11/21/2022  ID:  Celene Kras, DOB 30-Oct-1965, MRN [id number] PCP: Patient, No Pcp Per  [facility] Providers Cardiologist:  None     CC: Fatigue Consulted for the evaluation of syncope at the behest of Ms. Gwenevere Abbot NP  History of Present Illness: .    ZENOLA VROMAN is a 57 y.o. female with a history gastric bypass and breast cancer with Fhx of ATTR-CA with fx of Val 122LLe  The patient is a 57 year old individual with a history of ductal carcinoma in situ (ERPR positive, HER2 negative), for which she underwent mastectomy. She did not receive radiation or chemotherapy but was treated with tamoxifen. The patient also has a history of gastric bypass surgery. She presents with concerns of near syncope and generalized weakness.  The patient reports experiencing dizzy spells and has had two recent episodes of syncope. The first episode occurred when she stood up from a sitting position and walked a short distance. The second episode occurred shortly after standing up, causing her to fall before moving away from the couch. These episodes have been accompanied by visual disturbances described as black spots moving across her field of vision.  In addition to these symptoms, the patient reports significant fatigue, particularly after showering, and difficulty walking due to severe leg pain. The pain is described as similar to muscle soreness after a workout and is present even at rest. The patient has been managing the pain with medication and by elevating her legs.  These symptoms have been present for approximately a month but have intensified over the past week. The patient also mentions a family history of cardiac amyloidosis and expresses concern about the possibility of having this condition. She does not currently have a primary care physician but plans to establish care in the new year.  Relevant histories: .  Social comes with sister, hx of Val  122lle with one family member with ATTR-CA, no ATTR- PN ROS: As per HPI.   Physical Exam:    VS:  BP (!) 141/97   Pulse 85   Ht 5\' 11"  (1.803 m)   Wt 217 lb (98.4 kg)   LMP 04/05/2016 (Approximate)   SpO2 98%   BMI 30.27 kg/m    Wt Readings from Last 3 Encounters:  11/21/22 217 lb (98.4 kg)  11/17/22 235 lb 3.7 oz (106.7 kg)  09/26/22 235 lb 4.8 oz (106.7 kg)    Standing 130/70 with heart rate 105, unable to perform prolonged stand  Gen: mild distress  Neck: No JVD Cardiac: No Rubs or Gallops, systolic murmur, RRR +2 radial pulses Respiratory: Clear to auscultation bilaterally, normal effort, normal  respiratory rate GI: Soft, nontender, non-distended  MS: No  edema;  moves all extremities Integument: Skin feels warm Neuro:  At time of evaluation, alert and oriented to person/place/time/situation  Psych: Normal affect, patient feels fair   ASSESSMENT AND PLAN: .    Syncope - Recurrent episodes of near syncope and syncope with associated dizziness and visual disturbances. Family history of cardiomyopathy and ATTR cardiac amyloidosis. Differential includes cardiac arrhythmia, structural heart disease, and nutritional deficiencies post-gastric bypass. Discussed risks of potential cardiac issues and benefits of early detection through echocardiogram and heart monitoring.  - Screen for cardiac amyloid- though she is young for this, she has orthostasis like symptoms and her family member younger than her has ATTR-CA - Order echocardiogram - Place event monitor (her insurance will not cover  -  Evaluate for cardiac amyloidosis if initial tests are negative - Refer to neurology for evaluation of visual disturbances and potential ATTR polyneuropathy (Dr. Everlena Cooper) - she is pending ATTR genetic testing as we work through our new laboratory  Fatigue - Generalized fatigue, worsened over the past month. Possible contributing factors include cardiac issues, nutritional deficiencies  post-gastric bypass, and peripheral neuropathy. Discussed importance of evaluating for nutritional deficiencies and potential need for primary care follow-up for ongoing management. - Check basic metabolic panel and magnesium levels,  - Refer to primary care for ongoing management, may need nutritional deficiency assessment  Leg Pain Bilateral leg pain, described as muscle soreness, not clearly related to exertion. Differential includes peripheral neuropathy, poor circulation, and nutritional deficiencies. Discussed potential benefits of compression socks and need for further evaluation if symptoms persist. - Check basic metabolic panel and magnesium levels  Breast Cancer hx - no evidence of cardiac risk changes due to therapy  Follow up with my NP/PA team in 3 months unless ATTR-CA or other high risk findings   Riley Lam, MD FASE [facility] Cardiologist [facility]  [address] [place], [address] [place], [place] [address] [phone]  12:57 PM

## 2022-11-21 NOTE — Patient Instructions (Addendum)
Medication Instructions:  Your physician recommends that you continue on your current medications as directed. Please refer to the Current Medication list given to you today.  *If you need a refill on your cardiac medications before your next appointment, please call your pharmacy*   Lab Work:  If you have labs (blood work) drawn today and your tests are completely normal, you will receive your results only by: MyChart Message (if you have MyChart) OR A paper copy in the mail If you have any lab test that is abnormal or we need to change your treatment, we will call you to review the results.   Testing/Procedures: Your physician has requested that you have an echocardiogram. Echocardiography is a painless test that uses sound waves to create images of your heart. It provides your doctor with information about the size and shape of your heart and how well your heart's chambers and valves are working. This procedure takes approximately one hour. There are no restrictions for this procedure. Please do NOT wear cologne, perfume, aftershave, or lotions (deodorant is allowed). Please arrive 15 minutes prior to your appointment time.  Please note: We ask at that you not bring children with you during ultrasound (echo/ vascular) testing. Due to room size and safety concerns, children are not allowed in the ultrasound rooms during exams. Our front office staff cannot provide observation of children in our lobby area while testing is being conducted. An adult accompanying a patient to their appointment will only be allowed in the ultrasound room at the discretion of the ultrasound technician under special circumstances. We apologize for any inconvenience.  Your physician has requested that you wear a 2 week heart monitor.   Your physician has requested that you have an Amyloid Scan.   Your physician has referred you to see Neurology.    Follow-Up: At [facility], you and your health  needs are our priority.  As part of our continuing mission to provide you with exceptional heart care, we have created designated Provider Care Teams.  These Care Teams include your primary Cardiologist (physician) and Advanced Practice Providers (APPs -  Physician Assistants and Nurse Practitioners) who all work together to provide you with the care you need, when you need it.   Your next appointment:   3 month(s)  Provider:   Jari Favre, PA-C, Ronie Spies, PA-C, Robin Searing, NP, Jacolyn Reedy, PA-C, Eligha Bridegroom, NP, Tereso Newcomer, PA-C, or Perlie Gold, PA-C       Other Instructions  [employer] Cardiac Event Monitor Instructions  Your physician has requested you wear your cardiac event monitor for _____ days, (1-30). [employer] may call or text to confirm a shipping address. The monitor will be sent to a land address via UPS. [employer] will not ship a monitor to a PO BOX. It typically takes 3-5 days to receive your monitor after it has been enrolled. [employer] will assist with USPS tracking if your package is delayed. The telephone number for [employer] is [phone]. Once you have received your monitor, please review the enclosed instructions. Instruction tutorials can also be viewed under help and settings on the enclosed cell phone. Your monitor has already been registered assigning a specific monitor serial # to you.  Billing and Self Pay Discount Information  [employer] has been provided the insurance information we had on file for you.  If your insurance has been updated, please call [employer] at [phone] to provide them with your updated insurance information.   [employer] offers a discounted  Self Pay option for patients who have insurance that does not cover their cardiac event monitor or patients without insurance.  The discounted cost of a Self Pay Cardiac Event Monitor would be $225.00 , if the patient contacts [employer] at [phone] within 7 days of applying  the monitor to make payment arrangements.  If the patient does not contact [employer] within 7 days of applying the monitor, the cost of the cardiac event monitor will be $350.00.  Applying the monitor  Remove cell phone from case and turn it on. The cell phone works as IT consultant and needs to be within [employer] of you at all times. The cell phone will need to be charged on a daily basis. We recommend you plug the cell phone into the enclosed charger at your bedside table every night.  Monitor batteries: You will receive two monitor batteries labelled #1 and #2. These are your recorders. Plug battery #2 onto the second connection on the enclosed charger. Keep one battery on the charger at all times. This will keep the monitor battery deactivated. It will also keep it fully charged for when you need to switch your monitor batteries. A small light will be blinking on the battery emblem when it is charging. The light on the battery emblem will remain on when the battery is fully charged.  Open package of a Monitor strip. Insert battery #1 into black hood on strip and gently squeeze monitor battery onto connection as indicated in instruction booklet. Set aside while preparing skin.  Choose location for your strip, vertical or horizontal, as indicated in the instruction booklet. Shave to remove all hair from location. There cannot be any lotions, oils, powders, or colognes on skin where monitor is to be applied. Wipe skin clean with enclosed Saline wipe. Dry skin completely.  Peel paper labeled #1 off the back of the Monitor strip exposing the adhesive. Place the monitor on the chest in the vertical or horizontal position shown in the instruction booklet. One arrow on the monitor strip must be pointing upward. Carefully remove paper labeled #2, attaching remainder of strip to your skin. Try not to create any folds or wrinkles in the strip as you apply it.  Firmly press and release the  circle in the center of the monitor battery. You will hear a small beep. This is turning the monitor battery on. The heart emblem on the monitor battery will light up every 5 seconds if the monitor battery in turned on and connected to the patient securely. Do not push and hold the circle down as this turns the monitor battery off. The cell phone will locate the monitor battery. A screen will appear on the cell phone checking the connection of your monitor strip. This may read poor connection initially but change to good connection within the next minute. Once your monitor accepts the connection you will hear a series of 3 beeps followed by a climbing crescendo of beeps. A screen will appear on the cell phone showing the two monitor strip placement options. Touch the picture that demonstrates where you applied the monitor strip.  Your monitor strip and battery are waterproof. You are able to shower, bathe, or swim with the monitor on. They just ask you do not submerge deeper than 3 feet underwater. We recommend removing the monitor if you are swimming in a lake, river, or ocean.  Your monitor battery will need to be switched to a fully charged monitor battery approximately once a week.  The cell phone will alert you of an action which needs to be made.  On the cell phone, tap for details to reveal connection status, monitor battery status, and cell phone battery status. The green dots indicates your monitor is in good status. A red dot indicates there is something that needs your attention.  To record a symptom, click the circle on the monitor battery. In 30-60 seconds a list of symptoms will appear on the cell phone. Select your symptom and tap save. Your monitor will record a sustained or significant arrhythmia regardless of you clicking the button. Some patients do not feel the heart rhythm irregularities. [employer] will notify us of any serious or critical events.  Refer to instruction  booklet for instructions on switching batteries, changing strips, the Do not disturb or Pause features, or any additional questions.  Call [employer] at [phone], to confirm your monitor is transmitting and record your baseline. They will answer any questions you may have regarding the monitor instructions at that time.  Returning the monitor to [employer]  Place all equipment back into blue box. Peel off strip of paper to expose adhesive and close box securely. There is a prepaid UPS shipping label on this box. Drop in a UPS drop box, or at a UPS facility like [employer]. You may also contact [employer] to arrange UPS to pick up monitor package at your home.

## 2022-11-22 ENCOUNTER — Encounter: Payer: Self-pay | Admitting: Neurology

## 2022-11-23 ENCOUNTER — Encounter: Payer: Self-pay | Admitting: Adult Health

## 2022-11-23 ENCOUNTER — Telehealth: Payer: Self-pay | Admitting: Internal Medicine

## 2022-11-23 NOTE — Telephone Encounter (Signed)
Testing Sent for ATTR-PN  Brooke Lam, MD FASE [facility] Cardiologist [facility]  [address] [place], [address] [place], [place] [address] [phone]  3:48 PM

## 2022-11-29 ENCOUNTER — Ambulatory Visit: Payer: BC Managed Care – PPO | Admitting: Neurology

## 2022-12-06 ENCOUNTER — Telehealth: Payer: Self-pay

## 2022-12-06 NOTE — Telephone Encounter (Signed)
Call to patient to discuss lab work.  No answer, left detailed message per DPR explaining that No evidence thus far of multiple myeloma. Asked patient to call us back if any questions.

## 2022-12-06 NOTE — Telephone Encounter (Signed)
-----   Message from Armanda Magic sent at 11/30/2022  8:39 PM EST ----- No evidence thus far of multiple myeloma

## 2022-12-08 ENCOUNTER — Other Ambulatory Visit: Payer: Self-pay

## 2022-12-08 ENCOUNTER — Encounter (HOSPITAL_COMMUNITY): Payer: Self-pay

## 2022-12-08 ENCOUNTER — Telehealth: Payer: Self-pay | Admitting: Cardiology

## 2022-12-08 ENCOUNTER — Emergency Department (HOSPITAL_COMMUNITY): Payer: BC Managed Care – PPO

## 2022-12-08 ENCOUNTER — Telehealth: Payer: Self-pay

## 2022-12-08 ENCOUNTER — Emergency Department (HOSPITAL_COMMUNITY)
Admission: EM | Admit: 2022-12-08 | Discharge: 2022-12-08 | Disposition: A | Discharge status: Home / Self Care | Payer: BC Managed Care – PPO | Attending: Emergency Medicine | Admitting: Emergency Medicine

## 2022-12-08 DIAGNOSIS — I4891 Unspecified atrial fibrillation: Secondary | ICD-10-CM | POA: Insufficient documentation

## 2022-12-08 DIAGNOSIS — Z79899 Other long term (current) drug therapy: Secondary | ICD-10-CM | POA: Insufficient documentation

## 2022-12-08 DIAGNOSIS — Z7901 Long term (current) use of anticoagulants: Secondary | ICD-10-CM | POA: Insufficient documentation

## 2022-12-08 DIAGNOSIS — R002 Palpitations: Secondary | ICD-10-CM | POA: Diagnosis present

## 2022-12-08 DIAGNOSIS — I480 Paroxysmal atrial fibrillation: Secondary | ICD-10-CM

## 2022-12-08 HISTORY — DX: Paroxysmal atrial fibrillation: I48.0

## 2022-12-08 LAB — CBC
HCT: 37.3 % (ref 36.0–46.0)
Hemoglobin: 11.6 g/dL — ABNORMAL LOW (ref 12.0–15.0)
MCH: 30.9 pg (ref 26.0–34.0)
MCHC: 31.1 g/dL (ref 30.0–36.0)
MCV: 99.5 fL (ref 80.0–100.0)
Platelets: 437 10*3/uL — ABNORMAL HIGH (ref 150–400)
RBC: 3.75 MIL/uL — ABNORMAL LOW (ref 3.87–5.11)
RDW: 15.4 % (ref 11.5–15.5)
WBC: 6.5 10*3/uL (ref 4.0–10.5)
nRBC: 0 % (ref 0.0–0.2)

## 2022-12-08 LAB — BASIC METABOLIC PANEL
Anion gap: 12 (ref 5–15)
BUN: 17 mg/dL (ref 6–20)
CO2: 25 mmol/L (ref 22–32)
Calcium: 9.2 mg/dL (ref 8.9–10.3)
Chloride: 100 mmol/L (ref 98–111)
Creatinine, Ser: 0.92 mg/dL (ref 0.44–1.00)
GFR, Estimated: >60 mL/min (ref >60)
Glucose, Bld: 111 mg/dL — ABNORMAL HIGH (ref 70–99)
Potassium: 4 mmol/L (ref 3.5–5.1)
Sodium: 137 mmol/L (ref 135–145)

## 2022-12-08 LAB — MAGNESIUM: Magnesium: 2.2 mg/dL (ref 1.7–2.4)

## 2022-12-08 MED ORDER — APIXABAN 5 MG PO TABS: 5.0000 mg | ORAL_TABLET | Freq: Two times a day (BID) | ORAL | 0 refills | Status: DC

## 2022-12-08 MED ORDER — APIXABAN (ELIQUIS) EDUCATION KIT FOR DVT/PE PATIENTS: PACK | Freq: Once | Status: DC

## 2022-12-08 NOTE — Telephone Encounter (Signed)
Notified by [employer] that patient had a critical EKG this morning- Representative informed me that patient had an EKG at 5:45 AM that showed atrial fibrillation with HR 166 BPM. On further review, patient has been in afib with HR in the 160s since 5:54 AM today. When I spoke to [employer] rep at 8:20 AM, patient was still in atrial fibrillation   I called patient to discuss these monitor findings. Patient reported "not feeling well" this morning. Reports feeling tired, sluggish. Denies dizziness, syncope, near syncope, palpitations. She does not have a BP cuff or pulse ox with her. She denies any known history of atrial fibrillation.   I attempted to get patient an office appointment today, but DOD slots are full. As she is sustaining afib with RVR and is not on a blood thinner, I instructed her to go to the ED for evaluation. Strip is not available under Media dated 11/21/22. On my review, confirms atrial fibrillation   Will update Dr. Izora Ribas as an Olegario Shearer, PA-C 12/08/2022 8:31 AM

## 2022-12-08 NOTE — ED Provider Notes (Signed)
[place] EMERGENCY DEPARTMENT AT [facility] Provider Note   CSN: [id number] Arrival date & time: 12/08/22  4098     History  Chief Complaint  Patient presents with   Palpitations    Brooke [name] is a 57 y.o. female.  57 yo F with a chief complaints of feeling fatigued.  She had passed out and is wearing a Holter monitor.  The cardiologist got a call from [employer] saying that she had been in A-fib for about 24 hours with rates up to the 140s.  They called her at home and with her being fatigued told her to come to the ED for evaluation.  Patient tells me that she is normally fatigued in the morning.  Has been since she had gotten therapy for cancer.  She denies any significant symptoms yesterday.  She denies cough congestion or fever denies chest pain denies difficulty breathing denies abdominal pain nausea vomiting or diarrhea.   Palpitations      Home Medications Prior to Admission medications   Medication Sig Start Date End Date Taking? Authorizing Provider  apixaban (ELIQUIS) 5 MG TABS tablet Take 1 tablet (5 mg total) by mouth 2 (two) times daily. 12/08/22 01/07/23 Yes Melene Plan, DO  ondansetron (ZOFRAN) 4 MG tablet Take 1 tablet (4 mg total) by mouth every 6 (six) hours. 11/17/22   Coral Spikes, DO  triamterene-hydrochlorothiazide (DYAZIDE) 37.5-25 MG capsule TAKE 1 CAPSULE BY MOUTH EVERY DAY 08/29/22   Causey, Larna Daughters, NP  valACYclovir (VALTREX) 500 MG tablet TAKE 1 TABLET (500 MG TOTAL) BY MOUTH DAILY. 07/25/22   [name], NP  venlafaxine XR (EFFEXOR-XR) 37.5 MG 24 hr capsule TAKE 1 CAPSULE BY MOUTH DAILY WITH BREAKFAST. 08/18/22   [name], NP  Vitamin D, Ergocalciferol, (DRISDOL) 1.25 MG (50000 UNIT) CAPS capsule TAKE 1 CAPSULE BY MOUTH ONE TIME PER WEEK 06/27/22   [name], NP      Allergies    Patient has no known allergies.    Review of Systems   Review of Systems  Cardiovascular:   Positive for palpitations.    Physical Exam Updated Vital Signs BP (!) 171/101 (BP Location: Left Arm)   Pulse 74   Temp 97.7 F (36.5 C) (Oral)   Resp 18   Ht 5\' 11"  (1.803 m)   Wt 98.4 kg   LMP 04/05/2016 (Approximate)   SpO2 97%   BMI 30.26 kg/m  Physical Exam Vitals and nursing note reviewed.  Constitutional:      General: She is not in acute distress.    Appearance: She is well-developed. She is not diaphoretic.  HENT:     Head: Normocephalic and atraumatic.  Eyes:     Pupils: Pupils are equal, round, and reactive to light.  Cardiovascular:     Rate and Rhythm: Normal rate and regular rhythm.     Heart sounds: No murmur heard.    No friction rub. No gallop.  Pulmonary:     Effort: Pulmonary effort is normal.     Breath sounds: No wheezing or rales.  Abdominal:     General: There is no distension.     Palpations: Abdomen is soft.     Tenderness: There is no abdominal tenderness.  Musculoskeletal:        General: No tenderness.     Cervical back: Normal range of motion and neck supple.  Skin:    General: Skin is warm and dry.  Neurological:  Mental Status: She is alert and oriented to person, place, and time.  Psychiatric:        Behavior: Behavior normal.     ED Results / Procedures / Treatments   Labs (all labs ordered are listed, but only abnormal results are displayed) Labs Reviewed  BASIC METABOLIC PANEL - Abnormal; Notable for the following components:      Result Value   Glucose, Bld 111 (*)    All other components within normal limits  CBC - Abnormal; Notable for the following components:   RBC 3.75 (*)    Hemoglobin 11.6 (*)    Platelets 437 (*)    All other components within normal limits  MAGNESIUM    EKG EKG Interpretation Date/Time:  Thursday December 08 2022 09:05:35 EST Ventricular Rate:  75 PR Interval:  150 QRS Duration:  95 QT Interval:  406 QTC Calculation: 454 R Axis:   -21  Text Interpretation: Sinus rhythm RSR' in  V1 or V2, right VCD or RVH Left ventricular hypertrophy No significant change since last tracing Confirmed by Melene Plan [phone]) on 12/08/2022 9:43:28 AM  Radiology DG Chest Port 1 View  Result Date: 12/08/2022 CLINICAL DATA:  Palpitation and atrial fibrillation EXAM: PORTABLE CHEST 1 VIEW COMPARISON:  Chest radiograph dated 11/17/2022 FINDINGS: Normal lung volumes. No focal consolidations. No pleural effusion or pneumothorax. Similar mildly enlarged cardiomediastinal silhouette. No acute osseous abnormality. IMPRESSION: 1.  No focal consolidations. 2. Similar mild cardiomegaly. Electronically Signed   By: Agustin Cree M.D.   On: 12/08/2022 10:39    Procedures Procedures    Medications Ordered in ED Medications  apixaban [facility]) Education Kit for DVT/PE patients (has no administration in time range)    ED Course/ Medical Decision Making/ A&P                                 Medical Decision Making Amount and/or Complexity of Data Reviewed Labs: ordered. Radiology: ordered.  Risk Prescription drug management.   57 yo F with a chief complaints of fatigue.  She tells me that this is not uncommon for her.  She had been wearing a Holter monitor for a syncopal event that she had and was found to be in atrial fibrillation all day yesterday and told to come to the ED.  Her initial EKG is normal sinus rhythm.  She is normal sinus rhythm on the monitor.  Her CHADS2VASc score is a 2 on my calculation.  Based on the telephone notes it sounds like the cardiologist was interested in starting anticoagulation.  No significant electrolyte abnormalities.  No acute anemia.  Chest x-ray independently interpreted by me without focal infiltrate or pneumothorax.  Patient continues to be in normal sinus rhythm.  Will discharge home.  If it clinic follow-up.  CHA2DS2/VAS Stroke Risk Points      N/A >= 2 Points: High Risk  1 to 1.99 Points: Medium Risk  0 Points: Low Risk    Last Change: N/A      This  score determines the patient's risk of having a stroke if the  patient has atrial fibrillation.      This score is not applicable to this patient. Components are not  calculated.            Final Clinical Impression(s) / ED Diagnoses Final diagnoses:  Atrial fibrillation with RVR (HCC)    Rx / DC Orders ED Discharge Orders  Ordered    apixaban (ELIQUIS) 5 MG TABS tablet  2 times daily        12/08/22 1120              Melene Plan, [place] 12/08/22 1121

## 2022-12-08 NOTE — ED Triage Notes (Signed)
Patient is here for evaluation of heart palpitations. Patient reports she is currently wearing a heart monitor and was called and told to come to the ER due to being in Afib and having a high heart rate. Patient's HR in triage is normal and is in NSR. Pt reports left arm numbness that started once she got into triage. No other complaints.

## 2022-12-08 NOTE — Discharge Instructions (Signed)
Please follow-up with your cardiologist in the office.

## 2022-12-08 NOTE — Telephone Encounter (Signed)
   Cardiac Monitor Alert  Date of alert:  12/08/2022   Patient Name: Brooke [name]  DOB: Feb 25, 1965  MRN: [id number]   [facility] Cardiologist: [facility] EP:  None    Monitor Information: Cardiac Event Monitor [[employer]]  Reason:  Syncope Ordering provider:  [facility]   Alert Atrial Fibrillation/Flutter This is the 1st alert for this rhythm.  The patient has no hx of Atrial Fibrillation/Flutter.  The patient is not currently on anticoagulation.  Next Cardiology Appointment   Date:  02/05/22  Provider:  Robin Searing  The patient was contacted today.  She is symptomatic.  She reports the following symptoms:  Patient states she does not feel well but unable to really describe symptoms.. Patient spoke with on call and was advised to go to ED for evaluation. She states she was on her way when I called her.  Dr. Izora Ribas is aware as well.  Other:   Nysha Koplin Merilynn Finland, LPN  28/03/1515 6:16 AM

## 2022-12-10 LAB — MULTIPLE MYELOMA PANEL, SERUM

## 2022-12-10 LAB — UPEP/UIFE/LIGHT CHAINS/TP, 24-HR UR
% BETA, Urine: 0 %
ALBUMIN, U: 100 %
ALPHA 1 URINE: 0 %
ALPHA-2-GLOBULIN, U: 0 %
Free Kappa Lt Chains,Ur: 7.14 mg/L (ref 1.17–86.46)
Free Lambda Lt Chains,Ur: 1.03 mg/L (ref 0.27–15.21)
GAMMA GLOBULIN URINE: 0 %
Kappa/Lambda Ratio,U: 6.93 (ref 1.83–14.26)
Protein, 24H Urine: 132 mg/(24.h) (ref 30–150)
Protein, Ur: 5.4 mg/dL

## 2022-12-15 ENCOUNTER — Other Ambulatory Visit: Payer: Self-pay

## 2022-12-15 ENCOUNTER — Ambulatory Visit (HOSPITAL_COMMUNITY)
Admission: RE | Admit: 2022-12-15 | Discharge: 2022-12-15 | Disposition: A | Discharge status: Home / Self Care | Payer: BC Managed Care – PPO | Source: Ambulatory Visit | Attending: Internal Medicine | Admitting: Internal Medicine

## 2022-12-15 VITALS — BP 142/80 | HR 67 | Ht 71.0 in | Wt 217.0 lb

## 2022-12-15 DIAGNOSIS — I4891 Unspecified atrial fibrillation: Secondary | ICD-10-CM | POA: Diagnosis not present

## 2022-12-15 DIAGNOSIS — I480 Paroxysmal atrial fibrillation: Secondary | ICD-10-CM | POA: Insufficient documentation

## 2022-12-15 MED ORDER — APIXABAN 5 MG PO TABS: 5.0000 mg | ORAL_TABLET | Freq: Two times a day (BID) | ORAL | 4 refills | Status: DC

## 2022-12-15 NOTE — Progress Notes (Signed)
Primary Care Physician: Patient, No Pcp Per Primary Cardiologist: None Electrophysiologist: None     Referring Physician: ED     Brooke [name] is a 57 y.o. female with a history of gastric bypass surgery, HTN?, breast cancer, and paroxysmal atrial fibrillation who presents for consultation in the [facility]. Seen by Cardiology for near syncope and family history of cardiac amyloidosis. A cardiac monitor was placed and Cardiology alerted on 12/5 for patient being in new onset Afib with RVR. Seen by ED on 12/5 and was back in NSR; placed on anticoagulation. Patient is on Eliquis 5 mg BID for a CHADS2VASC score of 2.  On evaluation today, she is currently in NSR. She is still wearing monitor until 12/17. She notes feeling tired but has felt this way prior to monitor placement. She wears a series 2 Apple watch.   Today, she denies symptoms of palpitations, chest pain, shortness of breath, orthopnea, PND, lower extremity edema, dizziness, presyncope, syncope, snoring, daytime somnolence, bleeding, or neurologic sequela. The patient is tolerating medications without difficulties and is otherwise without complaint today.    she has a BMI of Body mass index is 30.27 kg/m.Marland Kitchen Filed Weights   12/15/22 0912  Weight: 98.4 kg    Current Outpatient Medications  Medication Sig Dispense Refill   ondansetron (ZOFRAN) 4 MG tablet Take 1 tablet (4 mg total) by mouth every 6 (six) hours. 12 tablet 0   traMADol (ULTRAM) 50 MG tablet 1 tablet as needed Orally every 8 hours for 30 days As needed for pain     triamterene-hydrochlorothiazide (DYAZIDE) 37.5-25 MG capsule TAKE 1 CAPSULE BY MOUTH EVERY DAY 90 capsule 1   valACYclovir (VALTREX) 500 MG tablet TAKE 1 TABLET (500 MG TOTAL) BY MOUTH DAILY. 90 tablet 1   venlafaxine XR (EFFEXOR-XR) 37.5 MG 24 hr capsule TAKE 1 CAPSULE BY MOUTH DAILY WITH BREAKFAST. 90 capsule 1   Vitamin D, Ergocalciferol, (DRISDOL) 1.25 MG (50000 UNIT)  CAPS capsule TAKE 1 CAPSULE BY MOUTH ONE TIME PER WEEK 12 capsule 0   apixaban (ELIQUIS) 5 MG TABS tablet Take 1 tablet (5 mg total) by mouth 2 (two) times daily. 60 tablet 4   No current facility-administered medications for this encounter.    Atrial Fibrillation Management history:  Previous antiarrhythmic drugs: none Previous cardioversions: none Previous ablations: none Anticoagulation history: Eliquis   ROS- All systems are reviewed and negative except as per the HPI above.  Physical Exam: BP (!) 142/80   Pulse 67   Ht 5\' 11"  (1.803 m)   Wt 98.4 kg   LMP 04/05/2016 (Approximate)   BMI 30.27 kg/m   GEN: Well nourished, well developed in no acute distress NECK: No JVD; No carotid bruits CARDIAC: Regular rate and rhythm, no murmurs, rubs, gallops RESPIRATORY:  Clear to auscultation without rales, wheezing or rhonchi  ABDOMEN: Soft, non-tender, non-distended EXTREMITIES:  No edema; No deformity   EKG today demonstrates  Vent. rate 67 BPM PR interval 164 ms QRS duration 90 ms QT/QTcB 442/467 ms P-R-T axes 81 -14 22 Normal sinus rhythm with sinus arrhythmia Minimal voltage criteria for LVH, may be normal variant ( R in aVL ) Nonspecific T wave abnormality Prolonged QT Abnormal ECG When compared with ECG of 08-Dec-2022 09:05, PREVIOUS ECG IS PRESENT  Echo is scheduled.  ASSESSMENT & PLAN CHA2DS2-VASc Score = 2  The patient's score is based upon: CHF History: 0 HTN History: 1 Diabetes History: 0 Stroke History: 0 Vascular Disease  History: 0 Age Score: 0 Gender Score: 1       ASSESSMENT AND PLAN: Paroxysmal Atrial Fibrillation (ICD10:  I48.0) The patient's CHA2DS2-VASc score is 2, indicating a 2.2% annual risk of stroke.    She is in NSR. Education provided about Afib. Brief discussion about treatment options if monitor shows increased burden. We discussed her risk score and choice of continuing or discontinuing Eliquis in the future. She would like to  continue for now which is reasonable given monitor and unknown burden. Rhythm monitoring device recommended for series 4 or greater Apple watch or Kardiamobile device.   She has bloodwork scheduled for 01/16/23.   Follow up 3 months.   Lake Bells, PA-C  Af[facility] [address] [place], [place] [address] [phone]

## 2022-12-19 ENCOUNTER — Inpatient Hospital Stay: Payer: BC Managed Care – PPO

## 2022-12-19 ENCOUNTER — Other Ambulatory Visit: Payer: Self-pay | Admitting: Orthopaedic Surgery

## 2022-12-19 ENCOUNTER — Ambulatory Visit
Admission: RE | Admit: 2022-12-19 | Discharge: 2022-12-19 | Disposition: A | Discharge status: Home / Self Care | Payer: BC Managed Care – PPO | Source: Ambulatory Visit | Attending: Orthopaedic Surgery

## 2022-12-19 ENCOUNTER — Ambulatory Visit: Payer: BC Managed Care – PPO

## 2022-12-19 ENCOUNTER — Other Ambulatory Visit: Payer: Self-pay

## 2022-12-19 ENCOUNTER — Other Ambulatory Visit: Payer: BC Managed Care – PPO

## 2022-12-19 ENCOUNTER — Inpatient Hospital Stay: Payer: BC Managed Care – PPO | Attending: Adult Health

## 2022-12-19 VITALS — BP 141/82 | HR 87 | Temp 98.4°F | Resp 18

## 2022-12-19 DIAGNOSIS — E538 Deficiency of other specified B group vitamins: Secondary | ICD-10-CM

## 2022-12-19 DIAGNOSIS — M25572 Pain in left ankle and joints of left foot: Secondary | ICD-10-CM

## 2022-12-19 LAB — CBC WITH DIFFERENTIAL (CANCER CENTER ONLY)
Abs Immature Granulocytes: 0.02 10*3/uL (ref 0.00–0.07)
Basophils Absolute: 0.1 10*3/uL (ref 0.0–0.1)
Basophils Relative: 1 %
Eosinophils Absolute: 0.2 10*3/uL (ref 0.0–0.5)
Eosinophils Relative: 3 %
HCT: 35.9 % — ABNORMAL LOW (ref 36.0–46.0)
Hemoglobin: 11.6 g/dL — ABNORMAL LOW (ref 12.0–15.0)
Immature Granulocytes: 0 %
Lymphocytes Relative: 30 %
Lymphs Abs: 2.1 10*3/uL (ref 0.7–4.0)
MCH: 31.3 pg (ref 26.0–34.0)
MCHC: 32.3 g/dL (ref 30.0–36.0)
MCV: 96.8 fL (ref 80.0–100.0)
Monocytes Absolute: 0.8 10*3/uL (ref 0.1–1.0)
Monocytes Relative: 11 %
Neutro Abs: 4 10*3/uL (ref 1.7–7.7)
Neutrophils Relative %: 55 %
Platelet Count: 308 10*3/uL (ref 150–400)
RBC: 3.71 MIL/uL — ABNORMAL LOW (ref 3.87–5.11)
RDW: 16.1 % — ABNORMAL HIGH (ref 11.5–15.5)
WBC Count: 7.2 10*3/uL (ref 4.0–10.5)
nRBC: 0 % (ref 0.0–0.2)

## 2022-12-19 LAB — CMP (CANCER CENTER ONLY)
ALT: 11 U/L (ref 0–44)
AST: 18 U/L (ref 15–41)
Albumin: 4.4 g/dL (ref 3.5–5.0)
Alkaline Phosphatase: 178 U/L — ABNORMAL HIGH (ref 38–126)
Anion gap: 10 (ref 5–15)
BUN: 15 mg/dL (ref 6–20)
CO2: 28 mmol/L (ref 22–32)
Calcium: 9.9 mg/dL (ref 8.9–10.3)
Chloride: 101 mmol/L (ref 98–111)
Creatinine: 1.16 mg/dL — ABNORMAL HIGH (ref 0.44–1.00)
GFR, Estimated: 55 mL/min — ABNORMAL LOW (ref >60)
Glucose, Bld: 110 mg/dL — ABNORMAL HIGH (ref 70–99)
Potassium: 3.8 mmol/L (ref 3.5–5.1)
Sodium: 139 mmol/L (ref 135–145)
Total Bilirubin: 0.5 mg/dL (ref <1.2)
Total Protein: 7.5 g/dL (ref 6.5–8.1)

## 2022-12-19 MED ORDER — CYANOCOBALAMIN 1000 MCG/ML IJ SOLN
1000.0000 ug | Freq: Once | INTRAMUSCULAR | Status: AC
  Administered 2022-12-19: 1000 ug via INTRAMUSCULAR
  Filled 2022-12-19: qty 1

## 2022-12-20 ENCOUNTER — Telehealth: Payer: Self-pay | Admitting: Internal Medicine

## 2022-12-20 ENCOUNTER — Encounter: Payer: Self-pay | Admitting: Adult Health

## 2022-12-20 NOTE — Telephone Encounter (Signed)
Dr. Izora Ribas - I discussed this with my supervisor and unfortunately, I cannot see the patient unless they are referred to the AHF group as well. This is because you are not one of the supervising physicians on my CPP agreement. However, I am happy to help any of the pharmacists at the [facility] office get her started on the therapy. I can help with the prior authorization and any access concerns. As far as teaching for the injections goes, the eplontersen self-injection pen is very similar to the Repatha pen, which is a teaching the other pharmacists are already very familiar with. The manufacturer also provides demonstration pens and they have their own patient education liasons that can assist patients with teaching if needed. I spoke with Melissa and let her know I am happy to help in any way I can.

## 2022-12-20 NOTE — Telephone Encounter (Signed)
See phone note

## 2022-12-20 NOTE — Telephone Encounter (Signed)
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 - discussed findings - she has follow up genetic counseling pending for his kids - will defer referral for gene therapy - we will bring her back in January to discuss options - if + PYP will start stabilizer therapy - either way, we will hope to start her on eplontersen + 3000 international units Vitamin A  Lauren- we haven't had the pleasure of working together very much.  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  Are you able to assist with this/including teaching for injections or is this only the case if they are followed by the AHF group?  It would affect was silencer I start her on (as would her PYP results) so I wanted to check first.  Dr. Everlena Cooper- I had sent this patient a referral for evaluation.     Riley Lam, MD Cardiologist [facility]  [address] [place], [address] [place], [place] [address] [phone]  2:44 PM

## 2022-12-21 ENCOUNTER — Other Ambulatory Visit: Payer: Self-pay

## 2022-12-21 ENCOUNTER — Other Ambulatory Visit: Payer: Self-pay | Admitting: Orthopaedic Surgery

## 2022-12-23 ENCOUNTER — Encounter (HOSPITAL_COMMUNITY): Payer: Self-pay | Admitting: Orthopaedic Surgery

## 2022-12-23 ENCOUNTER — Telehealth: Payer: Self-pay | Admitting: Internal Medicine

## 2022-12-23 NOTE — Telephone Encounter (Signed)
Pharmacy please advise on holding Eliquis prior to open treatment of left tibial fracture scheduled for 12/27/2022. Thank you.

## 2022-12-23 NOTE — Progress Notes (Signed)
SDW call  Patient was given pre-op instructions over the phone. Patient verbalized understanding of instructions provided.     PCP - Denies Cardiologist - Dr. Izora Ribas Pulmonary:    PPM/ICD - denies Device Orders - na Rep Notified - na   Chest x-ray - 12/08/2022 EKG -  12/15/2022 Stress Test - ECHO -  Cardiac Cath -   Sleep Study/sleep apnea/CPAP: denies  Non-diabetic  Blood Thinner Instructions: Eliquis, per Cardiology hold 3 days, last dose 12/23/2022 Aspirin Instructions:denies   ERAS Protcol - NPO   COVID TEST- na    Anesthesia review: Yes.  HTN. Recent diagnoses of A-fib, syncope, on Eliquis   Patient denies shortness of breath, fever, cough and chest pain over the phone call  Your procedure is scheduled on Tuesday December 27, 2022  Report to [facility] Main Entrance "A" at 0530 A.M., then check in with the Admitting office.  Call this number if you have problems the morning of surgery:  [phone]   If you have any questions prior to your surgery date call [phone]: Open Monday-Friday 8am-4pm If you experience any cold or flu symptoms such as cough, fever, chills, shortness of breath, etc. between now and your scheduled surgery, please notify us at the above number    Remember:  Do not eat or drink after midnight the night before your surgery  Take these medicines the morning of surgery with A SIP OF WATER:  Valtrex, effexor  As needed: Tramadol  As of today, STOP taking any Aspirin (unless otherwise instructed by your surgeon) Aleve, Naproxen, Ibuprofen, Motrin, Advil, Goody's, BC's, all herbal medications, fish oil, and all vitamins.

## 2022-12-23 NOTE — Progress Notes (Signed)
Anesthesia Chart Review: SAME DAY WORK-UP  Case: [id number] Date/Time: 12/27/22 0715   Procedure: OPEN REDUCTION INTERNAL FIXATION (ORIF) TIBIA/FIBULA FRACTURE (Left)   Anesthesia type: General   Pre-op diagnosis: left tibia fracture weightberaing surface, fibula fracture, syndesmosis disruption   [facility] ROOM 05 / [facility]   Surgeons: Terance Hart, MD       DISCUSSION: Patient is a 57 year old female scheduled for the above procedure.   History includes never smoker, HTN, PAF (12/08/22), syncope (x2 11/2022), anemia, right breast cancer (right mastectomy with reconstruction & left breast lumpectomy 04/06/15; removal of right breast implant 06/24/22), gastric bypass, chronic headaches. Alcohol intake is documented as 4-5 alcohol beverages on the weekends. She has on-going evaluation for transthyretin amyloidosis.   She was established with cardiologist Dr. Izora Ribas on 11/21/22 for recurrent syncope. Initially evaluated at [facility] by Dr. Jillyn Hidden on 11/16/22 for syncope upon standing the week prior. EKG showed "possible QT prolongation." She was referred to cardiology asap and in the interim advised to go to ED if recurrent episode. On 11/17/22, she had recurrent syncope upon standing. Husband estimated LOC for 1 to 2 minutes. She had reported generalized malaise and decreased appetite with some intermittent N/V.  She was orthostatic positive and treated with IVF. CBC and lipase normal. CMET overall unremarkable with mildly elevated AST of 67. CXR without acute disease. She was discharged home and saw Dr. Izora Ribas on 11/21/22. She reported worsening fatigue and dizziness over the past months, as well as a family history of cardiac amyloidosis. No murmur by his exam. His differential for recurrent syncope included cardiac arrhythmia, structural heart disease, and nutritional deficiencies post-gastric bypass. He ordered an echocardiogram, event monitor, and ATTR genetic testing for  cardiac amyloid. He also recommended referral to neurology for visual disturbances and potential ATTR polyneuropathy. (She has pending visit with Dr. Allena Katz on 01/16/23.)  A [employer] cardiac monitor was placed on 11/21/22. On 12/08/22, cardiology received notification that patient was in afib with RVR at 166 bpm. She was sent to the ED, but was back in SR by that time. Labs unremarkable. CHA2DS2-VASc Score 2. Discharged with on apixaban 5 mg BID. She had follow-up in the Afib Clinic by Lake Bells, PA-C on 12/15/22. She remained in afib. He discussed her risk score and choice of continuing or discontinuing Eliquis in the future. He wrote, "She would like to continue for now which is reasonable given monitor and unknown burden. Rhythm monitoring device recommended for series 4 or greater Apple watch or Kardiamobile device." Three month follow-up planned.   Per 12/20/22 telephone encounter by Dr. 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 A PYP and echo are scheduled for 01/02/23. Genetic counseling planned. He will discuss options at follow-up next month, but will consider starting her on eplontersen + Vitamin A.   Apparently, she fell and sustained a left ankle fracture about 4 weeks ago. CT left ankle on 12/19/22 showed subacute ankle fractures with evidence of healing changes, transverse fracture through the tibial metaphysis with mild impaction and slight medial angulation, and oblique comminuted fracture the distal fibular shaft. Dr. Susa Simmonds has recommended ORIF. Message left for Lurena Joiner advising preoperative cardiology input. I have also communicated with Robin Searing, NP. He has advised that Eliquis can be held for 3 days prior to surgery, but has also reached out to Dr. Izora Ribas for his input.   Chart will be left for follow-up regarding any additional cardiology input  fro Dr. Izora Ribas. In the interim, discussed above with  anesthesiologist Val Eagle, MD.    [facility] 12/26/22 9:09 AM: Updated preoperative cardiology risk assessment reviewed.   Dr. Izora Ribas wrote, "She has a low burden of AF and has a time sensitive procedure. - reasonable to proceed - she is likely CHADSVASC NA due to her ATTR gene mutation but without clear ATTR-CA - reasonable to hold [facility] as you have discussed and proceed to time sensitive procedure."   On 12/25/22, Robin Searing, NP added, "Given past medical history and time since last visit, based on ACC/AHA guidelines, ADA OLIVEROS is at acceptable risk for the planned procedure without further cardiovascular testing. Per Dr. Raynelle Jan she can proceed with scheduled procedure.   Per office protocol, patient can hold Eliquis for 3 days prior to procedure. ...  As above, case previously discussed with anesthesiologist Dr. Maple Hudson, including pending cardiac studies. Patient with left tibia-fibula fracture in need of ORIF. Anesthesia team to evaluate on the day of surgery.   VS: LMP 04/05/2016 (Approximate)  BP Readings from Last 3 Encounters:  12/19/22 (!) 141/82  12/15/22 (!) 142/80  12/08/22 (!) 171/101   Pulse Readings from Last 3 Encounters:  12/19/22 87  12/15/22 67  12/08/22 74     PROVIDERS: She does not have a PCP but was evaluated by Cain Saupe, MD at [facility] on 11/16/22 for syncope. Riley Lam, MD is cardiologist Lake Bells, PA-C is Afib Clinic provider Lillard Anes, NP is [facility] provider. Last visit 09/26/22. No clinicial or radiographic signs of recurrent breast cancer at that time.  - She has pending neurology evaluation with Nita Sickle, DO on 01/16/23.   LABS: A1c 6.2% on 11/16/22 [facility]). Most recent lab results in [facility] include: Lab Results  Component Value Date   WBC 7.2 12/19/2022   HGB 11.6 (L) 12/19/2022   HCT 35.9 (L) 12/19/2022   PLT 308 12/19/2022   GLUCOSE 110 (H) 12/19/2022   ALT 11 12/19/2022   AST 18  12/19/2022   NA 139 12/19/2022   K 3.8 12/19/2022   CL 101 12/19/2022   CREATININE 1.16 (H) 12/19/2022   BUN 15 12/19/2022   CO2 28 12/19/2022   TSH 1.052 10/26/2021    IMAGES: CT Left Ankle 12/19/22: IMPRESSION: 1. Subacute ankle fractures with evidence of healing changes as detailed above. 2. Transverse fracture through the tibial metaphysis with mild impaction and slight medial angulation. No intra-articular component. 3. Oblique comminuted fracture the distal fibular shaft at and above the level of the ankle mortise with some early healing changes but not as significant as the tibia fracture. There is moderate medial angulation at the fracture site. 4. No midfoot or hindfoot fractures are identified. 5. Mild tibiotalar and moderate subtalar joint degenerative changes.  1V PCXR 12/08/22: FINDINGS: Normal lung volumes. No focal consolidations. No pleural effusion or pneumothorax. Similar mildly enlarged cardiomediastinal silhouette. No acute osseous abnormality. IMPRESSION: 1.  No focal consolidations. 2. Similar mild cardiomegaly.   EKG: EKG 12/14/12: Normal sinus rhythm with sinus arrhythmia Minimal voltage criteria for LVH, may be normal variant ( R in aVL ) Nonspecific T wave abnormality Prolonged QT (QT/QTcB 442/467 ms) Abnormal ECG Confirmed by Lennie Odor [phone]) on 12/15/2022 6:24:24 PM  EKG 12/08/22: Sinus rhythm RSR' in V1 or V2, right VCD or RVH Left ventricular hypertrophy No significant change since last tracing Confirmed by Melene Plan [phone]) on 12/08/2022 9:43:28 A   CV: Echo 01/02/23: Pending.  PYP Scan 01/02/23: Pending.  Cardiac event monitor 11/21/22 - 12/20/22. In process.    Past Medical History:  Diagnosis Date   Alcohol use disorder, mild, abuse 06/26/2019   Anemia    Anxiety    Breast cancer (HCC)    Breast cancer of upper-inner quadrant of right female breast (HCC) 02/18/2015   Chronic headaches    Depression    Fatigue     Hot flashes    Hypertension    PAF (paroxysmal atrial fibrillation) (HCC) 12/08/2022   Syncope 11/2022   Yeast infection     Past Surgical History:  Procedure Laterality Date   BREAST IMPLANT REMOVAL Right 06/24/2022   Procedure: REMOVAL BREAST IMPLANT;  Surgeon: Glenna Fellows, MD;  Location: [facility];  Service: Plastics;  Laterality: Right;   BREAST LUMPECTOMY Left 2017   BREAST LUMPECTOMY WITH RADIOACTIVE SEED LOCALIZATION Left 04/06/2015   BREAST LUMPECTOMY WITH RADIOACTIVE SEED LOCALIZATION Left 04/06/2015   Procedure: LEFT BREAST LUMPECTOMY WITH RADIOACTIVE SEED LOCALIZATION;  Surgeon: Ovidio Kin, MD;  [facility];  Service: General;  Laterality: Left;   BREAST RECONSTRUCTION WITH PLACEMENT OF TISSUE EXPANDER AND FLEX HD (ACELLULAR HYDRATED DERMIS) Right 04/06/2015   Procedure: RIGHT BREAST RECONSTRUCTION WITH PLACEMENT OF TISSUE EXPANDER, POSSIBLE ACELLULAR DERMIS;  Surgeon: Glenna Fellows, MD;  Location: [facility];  Service: Plastics;  Laterality: Right;   BREAST REDUCTION SURGERY Left 08/03/2015   Procedure: LEFT BREAST REDUCTION FOR SYMMETRY  (BREAST);  Surgeon: Glenna Fellows, MD;  Location: [facility];  Service: Plastics;  Laterality: Left;   CAPSULECTOMY Right 06/24/2022   Procedure: CAPSULECTOMY;  Surgeon: Glenna Fellows, MD;  Location: [facility];  Service: Plastics;  Laterality: Right;   CHOLECYSTECTOMY     GASTRIC BYPASS     LIPOSUCTION WITH LIPOFILLING N/A 08/03/2015   Procedure: LIPOSUCTION WITH LIPOFILLING ;  Surgeon: Glenna Fellows, MD;  Location: [facility];  Service: Plastics;  Laterality: N/A;   MASTECTOMY Right 2017   MASTECTOMY W/ SENTINEL NODE BIOPSY Right    MASTECTOMY W/ SENTINEL NODE BIOPSY Right 04/06/2015   Procedure: RIGHT MASTECTOMY WITH SENTINEL LYMPH NODE BIOPSY;  Surgeon: Ovidio Kin, MD;  [facility];  Service: General;  Laterality: Right;   REDUCTION MAMMAPLASTY Left 2017    REMOVAL OF BILATERAL TISSUE EXPANDERS WITH PLACEMENT OF BILATERAL BREAST IMPLANTS Right 08/03/2015   Procedure: REMOVAL OF RIGHT TISSUE EXPANDERS WITH PLACEMENT OF IMPLANT;  Surgeon: Glenna Fellows, MD;  Location: [facility];  Service: Plastics;  Laterality: Right;   TUBAL LIGATION      MEDICATIONS: No current facility-administered medications for this encounter.    apixaban (ELIQUIS) 5 MG TABS tablet   diclofenac Sodium (VOLTAREN) 1 % GEL   naproxen sodium (ALEVE) 220 MG tablet   traMADol (ULTRAM) 50 MG tablet   triamterene-hydrochlorothiazide (DYAZIDE) 37.5-25 MG capsule   valACYclovir (VALTREX) 500 MG tablet   venlafaxine XR (EFFEXOR-XR) 37.5 MG 24 hr capsule   Vitamin D, Ergocalciferol, (DRISDOL) 1.25 MG (50000 UNIT) CAPS capsule   oxyCODONE (OXY IR/ROXICODONE) 5 MG immediate release tablet    Shonna Chock, PA-C Surgical Short Stay/Anesthesiology [facility] Phone [phone] [facility] Phone [phone] 12/23/2022 6:01 PM

## 2022-12-23 NOTE — Telephone Encounter (Signed)
   Pre-operative Risk Assessment    Patient Name: Brooke [name]  DOB: 01-21-1965 MRN: [id number]      Request for Surgical Clearance    Procedure:  Open treatment left Tibia Fracture  Date of Surgery:           12-27-22                     Surgeon:  Dr Susa Simmonds Surgeon's Group or Practice Name:   Phone number:  [phone] Fax number:  [phone]   Type of Clearance Requested:   - Medical  - Pharmacy:  Hold Apixaban (Eliquis)     Type of Anesthesia:  General    Additional requests/questions:    [name]   12/23/2022, 3:54 PM

## 2022-12-23 NOTE — Telephone Encounter (Signed)
Patient with diagnosis of A fib on Eliquis for anticoagulation.    Procedure: Open treatment left Tibia Fracture   Date of procedure: 12/27/22   CHA2DS2-VASc Score = 2  This indicates a 2.2% annual risk of stroke. The patient's score is based upon: CHF History: 0 HTN History: 1 Diabetes History: 0 Stroke History: 0 Vascular Disease History: 0 Age Score: 0 Gender Score: 1       CrCl 68 mL/min using adj body weight Platelet count 308K   Per office protocol, patient can hold Eliquis for 3 days prior to procedure.     **This guidance is not considered finalized until pre-operative APP has relayed final recommendations.**

## 2022-12-23 NOTE — Telephone Encounter (Addendum)
   Patient Name: Brooke [name]  DOB: 1965-09-13 MRN: [id number]  Primary Cardiologist: None  Chart reviewed as part of pre-operative protocol coverage. Given past medical history and time since last visit, based on ACC/AHA guidelines, Brooke [name] is at acceptable risk for the planned procedure without further cardiovascular testing.  Patient's RCRI score is 0.9%.   Patient is able to complete greater than 4 METS of activity without any difficulty and is currently not experiencing any new cardiac complaints since her previous follow-up.  She was recently diagnosed with atrial fibrillation however is asymptomatic and currently is on Eliquis.   Per office protocol, patient can hold Eliquis for 3 days prior to procedure.    The patient was advised that if she develops new symptoms prior to surgery to contact our office to arrange for a follow-up visit, and she verbalized understanding.  We will await Dr. Morene Crocker feedback regarding final clearance and addendum will be sent over if additional testing is recommended.  I will route this recommendation to the requesting party via Epic fax function and remove from pre-op pool.  Please call with questions.  Napoleon Form, Leodis Rains, NP 12/23/2022, 4:15 PM

## 2022-12-23 NOTE — Anesthesia Preprocedure Evaluation (Signed)
Anesthesia Evaluation  Patient identified by MRN, date of birth, ID band Patient awake    Reviewed: Allergy & Precautions, NPO status , Patient's Chart, lab work & pertinent test results  History of Anesthesia Complications Negative for: history of anesthetic complications  Airway Mallampati: I  TM Distance: >3 FB Neck ROM: Full    Dental  (+) Missing, Dental Advisory Given, Chipped   Pulmonary neg pulmonary ROS   breath sounds clear to auscultation       Cardiovascular hypertension, Pt. on medications (-) angina + dysrhythmias Atrial Fibrillation  Rhythm:Irregular Rate:Normal     Neuro/Psych  Headaches  Anxiety Depression       GI/Hepatic ,,,(+)     substance abuse  alcohol useH/o gastric bypass   Endo/Other  BMI 30  Renal/GU negative Renal ROS     Musculoskeletal   Abdominal   Peds  Hematology eliquis   Anesthesia Other Findings H/o breast cancer  Reproductive/Obstetrics                             Anesthesia Physical Anesthesia Plan  ASA: 3  Anesthesia Plan: General   Post-op Pain Management: Regional block* and Tylenol PO (pre-op)*   Induction: Intravenous  PONV Risk Score and Plan: 3 and Ondansetron, Dexamethasone and Scopolamine patch - Pre-op  Airway Management Planned: LMA  Additional Equipment: None  Intra-op Plan:   Post-operative Plan:   Informed Consent: I have reviewed the patients History and Physical, chart, labs and discussed the procedure including the risks, benefits and alternatives for the proposed anesthesia with the patient or authorized representative who has indicated his/her understanding and acceptance.     Dental advisory given  Plan Discussed with: CRNA and Surgeon  Anesthesia Plan Comments: (Plan routine monitors, GA with adductor canal and popliteal blocks for post op analgesia  PAT note written by Shonna Chock, PA-C. Patient with  left ankle fracture. Recently established with cardiologist Dr. Izora Ribas for recurrent syncope. No murmur by his exam. Family history of cardiac amyloidosis. 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 Event monitor showed afib with RVR 12/08/22, but converted to SR without treatment. She has a pending PYP scan and echo scheduled for 01/02/23.    Preoperative cardiology risk assessment:  Dr. Izora Ribas wrote,  "She has a low burden of AF and has a time sensitive procedure. - reasonable to proceed - she is likely CHADSVASC NA due to her ATTR gene mutation but without clear ATTR-CA - reasonable to hold [facility] as you have discussed and proceed to time sensitive procedure."   On 12/25/22, Robin Searing, NP added, "Given past medical history and time since last visit, based on ACC/AHA guidelines, AKASIA BILLE is at acceptable risk for the planned procedure without further cardiovascular testing. Per Dr. Raynelle Jan she can proceed with scheduled procedure.  Per office protocol, patient can hold Eliquis for 3 days prior to procedure..." )        Anesthesia Quick Evaluation

## 2022-12-23 NOTE — Telephone Encounter (Signed)
Good afternoon Dr. Raynelle Jan  We have received a surgical clearance request for Ms. Brooke [name] for open treatment of left tibial fracture. They were seen recently in clinic on 11/21/2022 and was recently diagnosed with AF. Can you please comment on surgical clearance and if you feel she would be at acceptable risk for upcoming procedure. Please forward you guidance and recommendations to P CV DIV PREOP   Thank you Robin Searing, NP

## 2022-12-25 NOTE — Telephone Encounter (Signed)
   Patient Name: Brooke [name]  DOB: 11/27/1965 MRN: [id number]  Primary Cardiologist: None  Chart reviewed as part of pre-operative protocol coverage. Given past medical history and time since last visit, based on ACC/AHA guidelines, [name] is at acceptable risk for the planned procedure without further cardiovascular testing. Per Dr. Raynelle Jan she can proceed with scheduled procedure.  Per office protocol, patient can hold Eliquis for 3 days prior to procedure.    The patient was advised that if she develops new symptoms prior to surgery to contact our office to arrange for a follow-up visit, and she verbalized understanding.  I will route this recommendation to the requesting party via Epic fax function and remove from pre-op pool.  Please call with questions.  Napoleon Form, Leodis Rains, NP 12/25/2022, 5:13 PM

## 2022-12-27 ENCOUNTER — Encounter (HOSPITAL_COMMUNITY): Payer: Self-pay | Admitting: Orthopaedic Surgery

## 2022-12-27 ENCOUNTER — Encounter: Payer: Self-pay | Admitting: Adult Health

## 2022-12-27 ENCOUNTER — Inpatient Hospital Stay (HOSPITAL_COMMUNITY): Payer: BC Managed Care – PPO

## 2022-12-27 ENCOUNTER — Observation Stay (HOSPITAL_COMMUNITY)
Admission: RE | Admit: 2022-12-27 | Discharge: 2022-12-27 | Disposition: A | Discharge status: Home / Self Care | Payer: BC Managed Care – PPO | Attending: Orthopaedic Surgery | Admitting: Orthopaedic Surgery

## 2022-12-27 ENCOUNTER — Other Ambulatory Visit: Payer: Self-pay

## 2022-12-27 ENCOUNTER — Inpatient Hospital Stay (HOSPITAL_COMMUNITY): Payer: Self-pay | Admitting: Physician Assistant

## 2022-12-27 ENCOUNTER — Encounter (HOSPITAL_COMMUNITY)
Admission: RE | Disposition: A | Discharge status: Home / Self Care | Payer: Self-pay | Source: Home / Self Care | Attending: Orthopaedic Surgery

## 2022-12-27 DIAGNOSIS — Z7901 Long term (current) use of anticoagulants: Secondary | ICD-10-CM | POA: Diagnosis not present

## 2022-12-27 DIAGNOSIS — I480 Paroxysmal atrial fibrillation: Secondary | ICD-10-CM | POA: Diagnosis not present

## 2022-12-27 DIAGNOSIS — Z8781 Personal history of (healed) traumatic fracture: Principal | ICD-10-CM

## 2022-12-27 DIAGNOSIS — Z853 Personal history of malignant neoplasm of breast: Secondary | ICD-10-CM | POA: Diagnosis not present

## 2022-12-27 DIAGNOSIS — Z79899 Other long term (current) drug therapy: Secondary | ICD-10-CM | POA: Diagnosis not present

## 2022-12-27 DIAGNOSIS — X58XXXA Exposure to other specified factors, initial encounter: Secondary | ICD-10-CM | POA: Insufficient documentation

## 2022-12-27 DIAGNOSIS — S93432A Sprain of tibiofibular ligament of left ankle, initial encounter: Secondary | ICD-10-CM | POA: Diagnosis not present

## 2022-12-27 DIAGNOSIS — S82872A Displaced pilon fracture of left tibia, initial encounter for closed fracture: Principal | ICD-10-CM | POA: Insufficient documentation

## 2022-12-27 DIAGNOSIS — Z9889 Other specified postprocedural states: Secondary | ICD-10-CM

## 2022-12-27 DIAGNOSIS — I10 Essential (primary) hypertension: Secondary | ICD-10-CM | POA: Insufficient documentation

## 2022-12-27 HISTORY — PX: OPEN REDUCTION INTERNAL FIXATION (ORIF) TIBIA/FIBULA FRACTURE: SHX5992

## 2022-12-27 SURGERY — OPEN REDUCTION INTERNAL FIXATION (ORIF) TIBIA/FIBULA FRACTURE
Anesthesia: General | Laterality: Left

## 2022-12-27 MED ORDER — CHLORHEXIDINE GLUCONATE 0.12 % MT SOLN
15.0000 mL | Freq: Once | OROMUCOSAL | Status: AC
  Administered 2022-12-27: 15 mL via OROMUCOSAL
  Filled 2022-12-27: qty 15

## 2022-12-27 MED ORDER — SCOPOLAMINE 1 MG/3DAYS TD PT72
1.0000 | MEDICATED_PATCH | TRANSDERMAL | Status: DC
  Administered 2022-12-27: 1.5 mg via TRANSDERMAL
  Filled 2022-12-27: qty 1

## 2022-12-27 MED ORDER — FENTANYL CITRATE (PF) 250 MCG/5ML IJ SOLN
INTRAMUSCULAR | Status: AC
  Filled 2022-12-27: qty 5

## 2022-12-27 MED ORDER — MIDAZOLAM HCL 2 MG/2ML IJ SOLN
INTRAMUSCULAR | Status: DC | PRN
  Administered 2022-12-27: 2 mg via INTRAVENOUS

## 2022-12-27 MED ORDER — FENTANYL CITRATE (PF) 250 MCG/5ML IJ SOLN
INTRAMUSCULAR | Status: DC | PRN
  Administered 2022-12-27: 50 ug via INTRAVENOUS
  Administered 2022-12-27: 100 ug via INTRAVENOUS
  Administered 2022-12-27 (×5): 50 ug via INTRAVENOUS
  Administered 2022-12-27: 100 ug via INTRAVENOUS

## 2022-12-27 MED ORDER — ACETAMINOPHEN 500 MG PO TABS
1000.0000 mg | ORAL_TABLET | Freq: Once | ORAL | Status: AC
  Administered 2022-12-27: 1000 mg via ORAL
  Filled 2022-12-27: qty 2

## 2022-12-27 MED ORDER — LIDOCAINE 2% (20 MG/ML) 5 ML SYRINGE
INTRAMUSCULAR | Status: AC
  Filled 2022-12-27: qty 5

## 2022-12-27 MED ORDER — SODIUM CHLORIDE 0.9 % IV SOLN: INTRAVENOUS | Status: DC | PRN

## 2022-12-27 MED ORDER — DEXAMETHASONE SODIUM PHOSPHATE 10 MG/ML IJ SOLN
INTRAMUSCULAR | Status: DC | PRN
  Administered 2022-12-27: 8 mg via INTRAVENOUS

## 2022-12-27 MED ORDER — CEFAZOLIN SODIUM-DEXTROSE 2-4 GM/100ML-% IV SOLN
2.0000 g | INTRAVENOUS | Status: AC
  Administered 2022-12-27: 2 g via INTRAVENOUS
  Filled 2022-12-27: qty 100

## 2022-12-27 MED ORDER — POVIDONE-IODINE 10 % EX SWAB
2.0000 | Freq: Once | CUTANEOUS | Status: AC
  Administered 2022-12-27: 2 via TOPICAL

## 2022-12-27 MED ORDER — OXYCODONE HCL 5 MG PO TABS: 5.0000 mg | ORAL_TABLET | ORAL | 0 refills | Status: DC | PRN

## 2022-12-27 MED ORDER — PHENYLEPHRINE 80 MCG/ML (10ML) SYRINGE FOR IV PUSH (FOR BLOOD PRESSURE SUPPORT)
PREFILLED_SYRINGE | INTRAVENOUS | Status: DC | PRN
  Administered 2022-12-27: 80 ug via INTRAVENOUS

## 2022-12-27 MED ORDER — ONDANSETRON HCL 4 MG/2ML IJ SOLN
INTRAMUSCULAR | Status: DC | PRN
  Administered 2022-12-27: 4 mg via INTRAVENOUS

## 2022-12-27 MED ORDER — OXYCODONE HCL 5 MG PO TABS
ORAL_TABLET | ORAL | Status: AC
  Administered 2022-12-27: 5 mg via ORAL
  Filled 2022-12-27: qty 1

## 2022-12-27 MED ORDER — PHENYLEPHRINE 80 MCG/ML (10ML) SYRINGE FOR IV PUSH (FOR BLOOD PRESSURE SUPPORT)
PREFILLED_SYRINGE | INTRAVENOUS | Status: AC
  Filled 2022-12-27: qty 10

## 2022-12-27 MED ORDER — LIDOCAINE 2% (20 MG/ML) 5 ML SYRINGE
INTRAMUSCULAR | Status: DC | PRN
  Administered 2022-12-27: 100 mg via INTRAVENOUS

## 2022-12-27 MED ORDER — OXYCODONE HCL 5 MG PO TABS: 5.0000 mg | ORAL_TABLET | Freq: Once | ORAL | Status: AC | PRN

## 2022-12-27 MED ORDER — 0.9 % SODIUM CHLORIDE (POUR BTL) OPTIME
TOPICAL | Status: DC | PRN
  Administered 2022-12-27: 1000 mL

## 2022-12-27 MED ORDER — HYDROMORPHONE HCL 1 MG/ML IJ SOLN
INTRAMUSCULAR | Status: DC | PRN
  Administered 2022-12-27: .5 mg via INTRAVENOUS

## 2022-12-27 MED ORDER — PROPOFOL 10 MG/ML IV BOLUS
INTRAVENOUS | Status: DC | PRN
  Administered 2022-12-27: 200 mg via INTRAVENOUS

## 2022-12-27 MED ORDER — OXYCODONE HCL 5 MG/5ML PO SOLN: 5.0000 mg | Freq: Once | ORAL | Status: AC | PRN

## 2022-12-27 MED ORDER — MIDAZOLAM HCL 2 MG/2ML IJ SOLN
0.5000 mg | Freq: Once | INTRAMUSCULAR | Status: DC | PRN
Start: 2022-12-27 — End: 2022-12-27

## 2022-12-27 MED ORDER — HYDROMORPHONE HCL 1 MG/ML IJ SOLN
INTRAMUSCULAR | Status: AC
  Filled 2022-12-27: qty 1

## 2022-12-27 MED ORDER — MIDAZOLAM HCL 2 MG/2ML IJ SOLN
INTRAMUSCULAR | Status: AC
  Filled 2022-12-27: qty 2

## 2022-12-27 MED ORDER — PROPOFOL 10 MG/ML IV BOLUS
INTRAVENOUS | Status: AC
  Filled 2022-12-27: qty 20

## 2022-12-27 MED ORDER — CYCLOBENZAPRINE HCL 7.5 MG PO TABS: 7.5000 mg | ORAL_TABLET | Freq: Three times a day (TID) | ORAL | 0 refills | Status: DC | PRN

## 2022-12-27 MED ORDER — BUPIVACAINE-EPINEPHRINE (PF) 0.5% -1:200000 IJ SOLN
INTRAMUSCULAR | Status: DC | PRN
  Administered 2022-12-27: 30 mL via PERINEURAL
  Administered 2022-12-27: 15 mL via PERINEURAL

## 2022-12-27 MED ORDER — ROCURONIUM BROMIDE 10 MG/ML (PF) SYRINGE
PREFILLED_SYRINGE | INTRAVENOUS | Status: AC
  Filled 2022-12-27: qty 10

## 2022-12-27 MED ORDER — HYDROMORPHONE HCL 1 MG/ML IJ SOLN
0.2500 mg | INTRAMUSCULAR | Status: DC | PRN
Start: 2022-12-27 — End: 2022-12-27

## 2022-12-27 MED ORDER — ORAL CARE MOUTH RINSE: 15.0000 mL | Freq: Once | OROMUCOSAL | Status: AC

## 2022-12-27 MED ORDER — HYDROMORPHONE HCL 1 MG/ML IJ SOLN
INTRAMUSCULAR | Status: AC
  Filled 2022-12-27: qty 0.5

## 2022-12-27 SURGICAL SUPPLY — 80 items
BAG COUNTER SPONGE SURGICOUNT (BAG) ×1 IMPLANT
BANDAGE ESMARK 6X9 LF (GAUZE/BANDAGES/DRESSINGS) ×1 IMPLANT
BIT DRILL 2 CANN GRADUATED (BIT) IMPLANT
BIT DRILL 2.5 CANN STRL (BIT) IMPLANT
BLADE CLIPPER SURG (BLADE) IMPLANT
BNDG COHESIVE 4X5 TAN STRL (GAUZE/BANDAGES/DRESSINGS) IMPLANT
BNDG ELASTIC 4X5.8 VLCR STR LF (GAUZE/BANDAGES/DRESSINGS) ×1 IMPLANT
BNDG ELASTIC 6X10 VLCR STRL LF (GAUZE/BANDAGES/DRESSINGS) IMPLANT
BNDG ELASTIC 6X5.8 VLCR STR LF (GAUZE/BANDAGES/DRESSINGS) ×1 IMPLANT
BNDG ESMARK 6X9 LF (GAUZE/BANDAGES/DRESSINGS) ×1
BNDG GAUZE DERMACEA FLUFF 4 (GAUZE/BANDAGES/DRESSINGS) ×1 IMPLANT
BONE CANC CHIPS 20CC PCAN1/4 (Bone Implant) ×1 IMPLANT
BRUSH SCRUB EZ PLAIN DRY (MISCELLANEOUS) ×2 IMPLANT
CHIPS CANC BONE 20CC PCAN1/4 (Bone Implant) ×1 IMPLANT
COVER MAYO STAND STRL (DRAPES) ×1 IMPLANT
DRAPE C-ARM 42X72 X-RAY (DRAPES) ×1 IMPLANT
DRAPE C-ARMOR (DRAPES) ×1 IMPLANT
DRAPE HALF SHEET 40X57 (DRAPES) ×2 IMPLANT
DRAPE INCISE IOBAN 66X45 STRL (DRAPES) ×1 IMPLANT
DRAPE U-SHAPE 47X51 STRL (DRAPES) ×1 IMPLANT
DRILL SRG 2.8XANKL PLAT (DRILL) IMPLANT
DRSG ADAPTIC 3X8 NADH LF (GAUZE/BANDAGES/DRESSINGS) ×1 IMPLANT
ELECT REM PT RETURN 9FT ADLT (ELECTROSURGICAL) ×1
ELECTRODE REM PT RTRN 9FT ADLT (ELECTROSURGICAL) ×1 IMPLANT
GAUZE PAD ABD 8X10 STRL (GAUZE/BANDAGES/DRESSINGS) ×4 IMPLANT
GAUZE SPONGE 4X4 12PLY STRL (GAUZE/BANDAGES/DRESSINGS) ×1 IMPLANT
GAUZE XEROFORM 5X9 LF (GAUZE/BANDAGES/DRESSINGS) IMPLANT
GLOVE BIO SURGEON STRL SZ7.5 (GLOVE) ×1 IMPLANT
GLOVE BIO SURGEON STRL SZ8 (GLOVE) ×1 IMPLANT
GLOVE BIOGEL PI IND STRL 7.5 (GLOVE) ×1 IMPLANT
GLOVE BIOGEL PI IND STRL 8 (GLOVE) ×1 IMPLANT
GLOVE SURG ORTHO LTX SZ7.5 (GLOVE) ×2 IMPLANT
GLOVE XGUARD RR 2 7.5 (GLOVE) ×1 IMPLANT
GOWN STRL REUS W/ TWL LRG LVL3 (GOWN DISPOSABLE) ×2 IMPLANT
GOWN STRL REUS W/ TWL XL LVL3 (GOWN DISPOSABLE) ×1 IMPLANT
GRAFT BNE CANC CHIPS 1-8 20CC (Bone Implant) IMPLANT
GUIDEPIN ARTHROS 1.5 THREAD (PIN) IMPLANT
GUIDEPIN ARTHROS 1.5MM THREAD (PIN) ×2
K-WIRE BB-TAK (WIRE) ×1
KIT BASIN OR (CUSTOM PROCEDURE TRAY) ×1 IMPLANT
KIT TURNOVER KIT B (KITS) ×1 IMPLANT
KWIRE BB-TAK (WIRE) IMPLANT
MANIFOLD NEPTUNE II (INSTRUMENTS) ×1 IMPLANT
NS IRRIG 1000ML POUR BTL (IV SOLUTION) ×1 IMPLANT
PACK ORTHO EXTREMITY (CUSTOM PROCEDURE TRAY) ×1 IMPLANT
PAD ARMBOARD 7.5X6 YLW CONV (MISCELLANEOUS) ×2 IMPLANT
PAD CAST 4YDX4 CTTN HI CHSV (CAST SUPPLIES) ×1 IMPLANT
PADDING CAST COTTON 6X4 STRL (CAST SUPPLIES) ×1 IMPLANT
PLATE LOCK DIST FIB 5H TTNIUM (Plate) IMPLANT
PLATE TIB DIST LONG 13H (Plate) IMPLANT
SCREW ANKLE FT 3.5X60 (Screw) IMPLANT
SCREW CORT 3.5X18 THRD (Screw) IMPLANT
SCREW CORT LP ANKLE 3.5X55 (Screw) IMPLANT
SCREW LO-PRO TI 3.5X16MM (Screw) IMPLANT
SCREW LOCK COMP 3X16 (Screw) IMPLANT
SCREW LOCK VA 3.5X30 (Screw) IMPLANT
SCREW LOCK VA 3.5X32 (Screw) IMPLANT
SCREW LOCK VA 3.5X36 (Screw) IMPLANT
SCREW LOCK VA 3.5X38 (Screw) IMPLANT
SCREW LOCK VA 3.5X42 (Screw) IMPLANT
SCREW LOCK VA 3.5X44 (Screw) IMPLANT
SCREW NLOCK WORKHORSE 3.5X32 (Screw) IMPLANT
SCREW VAL KREULOCK 3.0X18 TI (Screw) IMPLANT
SCREW VAL KREULOCK 3X20 (Screw) IMPLANT
SPLINT PLASTER CAST FAST 5X30 (CAST SUPPLIES) IMPLANT
SPONGE T-LAP 18X18 ~~LOC~~+RFID (SPONGE) IMPLANT
STAPLER VISISTAT 35W (STAPLE) ×1 IMPLANT
STOCKINETTE IMPERVIOUS LG (DRAPES) IMPLANT
SUCTION TUBE FRAZIER 10FR DISP (SUCTIONS) ×1 IMPLANT
SUT ETHILON 2 0 FS 18 (SUTURE) IMPLANT
SUT MNCRL+ AB 3-0 CT1 36 (SUTURE) IMPLANT
SUT VIC AB 0 CT1 27XBRD ANBCTR (SUTURE) ×1 IMPLANT
SUT VIC AB 1 CT1 27XBRD ANBCTR (SUTURE) ×1 IMPLANT
SUT VIC AB 2-0 CT1 TAPERPNT 27 (SUTURE) ×2 IMPLANT
TOWEL GREEN STERILE (TOWEL DISPOSABLE) ×2 IMPLANT
TOWEL GREEN STERILE FF (TOWEL DISPOSABLE) ×1 IMPLANT
TRAY FOLEY MTR SLVR 16FR STAT (SET/KITS/TRAYS/PACK) IMPLANT
TUBE CONNECTING 12X1/4 (SUCTIONS) ×1 IMPLANT
WATER STERILE IRR 1000ML POUR (IV SOLUTION) ×2 IMPLANT
YANKAUER SUCT BULB TIP NO VENT (SUCTIONS) ×1 IMPLANT

## 2022-12-27 NOTE — Evaluation (Signed)
Physical Therapy Evaluation Patient Details Name: Brooke [name] MRN: [id number] DOB: 09/24/1965 Today's Date: 12/27/2022  History of Present Illness  57 y.o. female presents to [facility] hospital on 12/27/2022 for ORIF of L ankle pilon fx with fibular fixation and open treatment of syndesmosis. PMH includes anxiety, breast CA, depression, chronic HA, HTN, PAF.  Clinical Impression  Pt presents to PT with deficits in strength, power, endurance, gait, balance. Pt is able to transfer and ambulate for short household distances with support of RW while maintaining NWB through LLE. Pt is unable to hop sufficiently to simulate clearing an 6" step at this time with support of RW. Pt reports having one very short step and one larger step. Pt has been utilizing crutches since injury, however she believes she has been putting weight through LLE. PT provides recommendation of utilizing a wheelchair to ascend/descend with 2 person assist ideally. Due to current lack of a wheelchair and likely delay of delivery until after discharge PT then suggests ascending the 2 steps with crutches and assistance of spouse via gait belt. PT will follow acutely for further stair training if the pt remains admitted.    If plan is discharge home, recommend the following: A little help with walking and/or transfers;A little help with bathing/dressing/bathroom;Assistance with cooking/housework;Assist for transportation;Help with stairs or ramp for entrance   Can travel by private vehicle        Equipment Recommendations Wheelchair (measurements PT) (RW already delivered to bedside, pt declines need for [facility])  Recommendations for Other Services       Functional Status Assessment Patient has had a recent decline in their functional status and demonstrates the ability to make significant improvements in function in a reasonable and predictable amount of time.     Precautions / Restrictions Precautions Precautions:  Fall Restrictions Weight Bearing Restrictions Per Provider Order: Yes LLE Weight Bearing Per Provider Order: Non weight bearing      Mobility  Bed Mobility Overal bed mobility: Modified Independent                  Transfers Overall transfer level: Needs assistance Equipment used: Rolling walker (2 wheels) Transfers: Sit to/from Stand Sit to Stand: Contact guard assist           General transfer comment: verbal cues for trunk flexion    Ambulation/Gait Ambulation/Gait assistance: Contact guard assist Gait Distance (Feet): 20 Feet Assistive device: Rolling walker (2 wheels) Gait Pattern/deviations:  (hop-to gait) Gait velocity: reduced Gait velocity interpretation: <1.31 ft/sec, indicative of household ambulator   General Gait Details: slowed hop-to gait  Stairs Stairs:  (pt is unable to hop sufficiently to simulate clearing an 6" step at this time with support of RW. PT and patient verbally discuss other stair negotiation techniques)          Wheelchair Mobility     Tilt Bed    Modified Rankin (Stroke Patients Only)       Balance Overall balance assessment: Needs assistance Sitting-balance support: No upper extremity supported, Feet supported Sitting balance-Leahy Scale: Good     Standing balance support: Bilateral upper extremity supported, Reliant on assistive device for balance Standing balance-Leahy Scale: Poor                               Pertinent Vitals/Pain Pain Assessment Pain Assessment: Faces Faces Pain Scale: Hurts even more Pain Location: LLE Pain Descriptors / Indicators: Sharp Pain Intervention(s): Monitored  during session    Home Living Family/patient expects to be discharged to:: Private residence Living Arrangements: Spouse/significant other Available Help at Discharge: Family;Available 24 hours/day Type of Home: House Home Access: Stairs to enter Entrance Stairs-Rails: None Entrance Stairs-Number of  Steps: 2   Home Layout: One level Home Equipment: Agricultural consultant (2 wheels);Crutches (RW delivered at bedside)      Prior Function Prior Level of Function : Independent/Modified Independent;Working/employed;Driving                     Extremity/Trunk Assessment   Upper Extremity Assessment Upper Extremity Assessment: Overall WFL for tasks assessed    Lower Extremity Assessment Lower Extremity Assessment: LLE deficits/detail LLE Deficits / Details: generalized post-op weakness    Cervical / Trunk Assessment Cervical / Trunk Assessment: Normal  Communication   Communication Communication: No apparent difficulties Cueing Techniques: Verbal cues  Cognition Arousal: Alert Behavior During Therapy: WFL for tasks assessed/performed Overall Cognitive Status: Within Functional Limits for tasks assessed                                          General Comments General comments (skin integrity, edema, etc.): VSS on RA, education provided to maintain LLE elevation to reduce edema    Exercises     Assessment/Plan    PT Assessment Patient needs continued PT services  PT Problem List Decreased strength;Decreased activity tolerance;Decreased balance;Decreased mobility;Decreased knowledge of use of DME;Pain       PT Treatment Interventions Gait training;DME instruction;Stair training;Functional mobility training;Therapeutic activities;Therapeutic exercise;Balance training;Neuromuscular re-education;Patient/family education;Wheelchair mobility training    PT Goals (Current goals can be found in the Care Plan section)  Acute Rehab PT Goals Patient Stated Goal: to return home PT Goal Formulation: With patient Time For Goal Achievement: 01/10/23 Potential to Achieve Goals: Fair    Frequency Min 1X/week     Co-evaluation               AM-PAC PT "6 Clicks" Mobility  Outcome Measure Help needed turning from your back to your side while in a flat bed  without using bedrails?: None Help needed moving from lying on your back to sitting on the side of a flat bed without using bedrails?: None Help needed moving to and from a bed to a chair (including a wheelchair)?: A Little Help needed standing up from a chair using your arms (e.g., wheelchair or bedside chair)?: A Little Help needed to walk in hospital room?: A Little Help needed climbing 3-5 steps with a railing? : Total 6 Click Score: 18    End of Session Equipment Utilized During Treatment: Gait belt Activity Tolerance: Patient tolerated treatment well Patient left: in bed;with call bell/phone within reach Nurse Communication: Mobility status PT Visit Diagnosis: Other abnormalities of gait and mobility (R26.89);Muscle weakness (generalized) (M62.81)    Time: 4782-9562 PT Time Calculation (min) (ACUTE ONLY): 35 min   Charges:   PT Evaluation $PT Eval Low Complexity: 1 Low   PT General Charges $$ ACUTE PT VISIT: 1 Visit         Arlyss Gandy, PT, DPT Acute Rehabilitation Office [phone]   Arlyss Gandy 12/27/2022, 11:23 AM

## 2022-12-27 NOTE — Discharge Instructions (Signed)
DR. Lucia Gaskins FOOT & ANKLE SURGERY POST-OP INSTRUCTIONS   Pain Management The numbing medicine and your leg will last around 18 hours, take a dose of your pain medicine as soon as you feel it wearing off to avoid rebound pain. Keep your foot elevated above heart level.  Make sure that your heel hangs free ('floats'). Take all prescribed medication as directed. If taking narcotic pain medication you may want to use an over-the-counter stool softener to avoid constipation. You may take over-the-counter NSAIDs (ibuprofen, naproxen, etc.) as well as over-the-counter acetaminophen as directed on the packaging as a supplement for your pain and may also use it to wean away from the prescription medication.  Activity Non-weightbearing Keep splint intact  First Postoperative Visit Your first postop visit will be at least 2 weeks after surgery.  This should be scheduled when you schedule surgery. If you do not have a postoperative visit scheduled please call [phone] to schedule an appointment. At the appointment your incision will be evaluated for suture removal, x-rays will be obtained if necessary.  General Instructions Swelling is very common after foot and ankle surgery.  It often takes 3 months for the foot and ankle to begin to feel comfortable.  Some amount of swelling will persist for 6-12 months. DO NOT change the dressing.  If there is a problem with the dressing (too tight, loose, gets wet, etc.) please contact Dr. Pollie Friar office. DO NOT get the dressing wet.  For showers you can use an over-the-counter cast cover or wrap a washcloth around the top of your dressing and then cover it with a plastic bag and tape it to your leg. DO NOT soak the incision (no tubs, pools, bath, etc.) until you have approval from Dr. Lucia Gaskins.  Contact Dr. Huel Cote office or go to Emergency Room if: Temperature above 101 F. Increasing pain that is unresponsive to pain medication or elevation Excessive redness or  swelling in your foot Dressing problems - excessive bloody drainage, looseness or tightness, or if dressing gets wet Develop pain, swelling, warmth, or discoloration of your calf

## 2022-12-27 NOTE — Op Note (Signed)
Brooke [name] female 57 y.o. 12/27/2022  PreOperative Diagnosis: Left pilon ankle fracture with associated fibula fracture  PostOperative Diagnosis: Left pilon ankle fracture with associated fibula fracture Syndesmosis disruption  PROCEDURE: Open reduction internal fixation of left pilon ankle fracture with fibular fixation Open treatment of syndesmosis  SURGEON: Dub Mikes, MD  ASSISTANT: Jesse [place], PA-C was necessary for patient positioning, prep, drape, assistance with fracture reduction and placement of hardware  ANESTHESIA: General  FINDINGS: Subacute and displaced fracture of the weightbearing surface of the distal tibia with ankle joint involvement with associated fibula fracture Syndesmosis disruption  IMPLANTS: Arthrex anterior distal tibial locking plate, fibular locking plate  [id number]:57 y.o. female sustained a fibular fracture and did not comply with weightbearing restrictions and ended up collapsing her distal tibia including significant valgus through the ankle joint with intra-articular extension.  She was then indicated for surgery due to displacement and deformity.   Patient understood the risks, benefits and alternatives to surgery which include but are not limited to wound healing complications, infection, nonunion, malunion, need for further surgery as well as damage to surrounding structures. They also understood the potential for continued pain in that there were no guarantees of acceptable outcome After weighing these risks the patient opted to proceed with surgery.  PROCEDURE: Patient was identified in the preoperative holding area.  The left leg was marked by myself.  Consent was signed by myself and the patient.  Block was performed by anesthesia in the preoperative holding area.  Patient was taken to the operative suite and placed supine on the operative table.  General anesthesia was induced without difficulty. Bump was placed under the  operative hip and bone foam was used.  All bony prominences were well padded.  Tourniquet was placed on the operative thigh.  Preoperative antibiotics were given. The extremity was prepped and draped in the usual sterile fashion and surgical timeout was performed.  The limb was elevated and the tourniquet was inflated to 250 mmHg.  We began by making a longitudinal incision overlying the fibula.  It was taken sharply through skin and subcutaneous tissue.  Blunt dissection was used to mobilize skin flaps were able to gain access to the lateral fibula.  There is displacement of the fracture and some fracture callus had formed.  A rondure was used to take down the fracture callus to mobilize the fracture completely.  Freer Engineer, structural and what have elevator was used to mobilize the fracture.  Rondure was used to remove any fracture callus and mobilized the fragments.  Once the fracture fragments were completely mobilized we turned our attention to the tibia fracture.  A direct anterior approach was made to the distal tibia and ankle joint.  The incision was carried sharply through skin and subcutaneous tissue.  Skin flaps were created.  The extensor retinacular tissue was identified and incised in line with the incision and the interval between the tibialis anterior and extensor houses longus tendons were taken and the neurovascular bundle was identified and protected through the entire the case.  We are able to make an arthrotomy to view into the ankle joint for assistance with fracture reduction.  Then the fracture was identified and the fracture callus was removed with a rongeured.  Osteotome was used to mobilize the distal fracture fragments to aid with reduction.  Once the distal tibia fracture was further mobilized fluoroscopy confirmed complete mobilization of the fracture fragment.  We then turned our attention back to the fibula.  Then the  fibula was reduced under direct visualization help visually with a  pointed reduction forceps.  A distal fibular locking plate was placed with a combination of locking and nonlocking screws.  We then turned our attention back to the tibia.  The fracture was reduced under direct visualization.  We are able to see into the ankle joint to assess reduction of the mortise and intra-articular fragments.  Then provisional stabilization was used and a distal tibial locking plate was placed distally with rafting screws within the subchondral bone area.  Then fluoroscopy was used to aid and reducing the fracture acceptably and correcting the valgus deformity through the tibia.  Then the proximal screws were placed with a combination of locking and nonlocking screws through the plate stabilizing the fracture.  Then the ankle was inspected and stressed and found to have syndesmotic widening.  We then turned our attention to the syndesmosis.  Separate deep incision was created to gain access to the syndesmosis and it was reduced under direct visualization and held provisionally with a Weber clamp.  Then 2 syndesmosis screws were placed through the fibular plate stabilizing the syndesmosis.  After placement of the screws the ankle was stable and the mortise was well reduced.  There was some bony void within the anterior tibia due to impaction of the subchondral and metaphyseal region.  Cancellous bone chips were placed within the bone void and tamped into place.  Then the wounds were irrigated with normal saline.  They were closed in a layered fashion using 2-0 Vicryl, 3-0 Monocryl and 3-0 nylon suture.  Soft dressing was placed.  Tourniquet was released.  Short leg splint was placed.  She was then awakened from anesthesia and taken recovery in stable condition.  No complications.  Counts were correct.   POST OPERATIVE INSTRUCTIONS: Nonweightbearing to operative extremity Keep splint dry and intact DVT prophylaxis as indicated Follow-up in 2 weeks for splint removal, suture  removal if appropriate, nonweightbearing x-rays and placement of a short leg cast.  TOURNIQUET TIME: Less than 2 hours  BLOOD LOSS:  Minimal         DRAINS: none         SPECIMEN: none       COMPLICATIONS:  * No complications entered in OR log *         Disposition: PACU - hemodynamically stable.         Condition: stable

## 2022-12-27 NOTE — Anesthesia Postprocedure Evaluation (Signed)
Anesthesia Post Note  Patient: Brooke [name]  Procedure(s) Performed: OPEN REDUCTION INTERNAL FIXATION (ORIF) TIBIA/FIBULA FRACTURE, OPEN TREATMENT OF SYNDESMOSIS (Left)     Patient location during evaluation: PACU Anesthesia Type: General Level of consciousness: awake and alert, patient cooperative and oriented Pain management: pain level controlled Vital Signs Assessment: post-procedure vital signs reviewed and stable Respiratory status: spontaneous breathing, nonlabored ventilation and respiratory function stable Cardiovascular status: blood pressure returned to baseline and stable Postop Assessment: no apparent nausea or vomiting and adequate PO intake Anesthetic complications: no   No notable events documented.  Last Vitals:  Vitals:   12/27/22 1015 12/27/22 1030  BP: (!) 153/91 (!) 147/91  Pulse: 65 60  Resp: 14 12  Temp:  36.7 C  SpO2: 100% 99%    Last Pain:  Vitals:   12/27/22 1015  TempSrc:   PainSc: 4                  Mackena Plummer,E. Briasia Flinders

## 2022-12-27 NOTE — Anesthesia Procedure Notes (Signed)
Anesthesia Regional Block: Adductor canal block   Pre-Anesthetic Checklist: , timeout performed,  Correct Patient, Correct Site, Correct Laterality,  Correct Procedure, Correct Position, site marked,  Risks and benefits discussed,  Surgical consent,  Pre-op evaluation,  At surgeon's request and post-op pain management  Laterality: Left and Lower  Prep: chloraprep       Needles:  Injection technique: Single-shot  Needle Type: Echogenic Needle     Needle Length: 9cm  Needle Gauge: 21     Additional Needles:   Procedures:,,,, ultrasound used (permanent image in chart),,    Narrative:  Start time: 12/27/2022 7:04 AM End time: 12/27/2022 7:11 AM Injection made incrementally with aspirations every 5 mL.  Performed by: Personally  Anesthesiologist: Jairo Ben, MD  Additional Notes: Pt identified in Holding room.  Monitors applied. Working IV access confirmed. Timeout, Sterile prep L thigh.  #21ga ECHOgenic Arrow block needle into adductor canal with US guidance.  15cc 0.5% Bupivacaine 1:200k epi injected incrementally after negative test dose.  Patient asymptomatic, VSS, no heme aspirated, tolerated well.   Sandford Craze, MD

## 2022-12-27 NOTE — Transfer of Care (Signed)
Immediate Anesthesia Transfer of Care Note  Patient: Brooke [name]  Procedure(s) Performed: OPEN REDUCTION INTERNAL FIXATION (ORIF) TIBIA/FIBULA FRACTURE, OPEN TREATMENT OF SYNDESMOSIS (Left)  Patient Location: PACU  Anesthesia Type:General  Level of Consciousness: awake, alert , oriented, and patient cooperative  Airway & Oxygen Therapy: Patient Spontanous Breathing and Patient connected to nasal cannula oxygen  Post-op Assessment: Report given to RN and Post -op Vital signs reviewed and stable  Post vital signs: Reviewed and stable  Last Vitals:  Vitals Value Taken Time  BP 155/95 12/27/22 0936  Temp    Pulse 73 12/27/22 0938  Resp 29 12/27/22 0938  SpO2 100 % 12/27/22 0938  Vitals shown include unfiled device data.  Last Pain:  Vitals:   12/27/22 0558  TempSrc:   PainSc: 5          Complications: No notable events documented.

## 2022-12-27 NOTE — H&P (Signed)
PREOPERATIVE H&P  Chief Complaint: Left ankle pain  HPI: Brooke [name] is a 57 y.o. female who presents for preoperative history and physical with a diagnosis of distal tibia fracture with associated fibula fracture that involves the weightbearing surface of the tibia.  She sustained this injury a few weeks ago and has been walking on it and had persistent and progressive deformity of the joint and fractures.  She is here today for surgery.. Symptoms are rated as moderate to severe, and have been worsening.  This is significantly impairing activities of daily living.  She has elected for surgical management.   Past Medical History:  Diagnosis Date   Alcohol use disorder, mild, abuse 06/26/2019   Anemia    Anxiety    Breast cancer (HCC)    Breast cancer of upper-inner quadrant of right female breast (HCC) 02/18/2015   Chronic headaches    Depression    Fatigue    Hot flashes    Hypertension    PAF (paroxysmal atrial fibrillation) (HCC) 12/08/2022   Syncope 11/2022   Yeast infection    Past Surgical History:  Procedure Laterality Date   BREAST IMPLANT REMOVAL Right 06/24/2022   Procedure: REMOVAL BREAST IMPLANT;  Surgeon: Glenna Fellows, MD;  Location: [facility];  Service: Plastics;  Laterality: Right;   BREAST LUMPECTOMY Left 2017   BREAST LUMPECTOMY WITH RADIOACTIVE SEED LOCALIZATION Left 04/06/2015   BREAST LUMPECTOMY WITH RADIOACTIVE SEED LOCALIZATION Left 04/06/2015   Procedure: LEFT BREAST LUMPECTOMY WITH RADIOACTIVE SEED LOCALIZATION;  Surgeon: Ovidio Kin, MD;  Location: [facility];  Service: General;  Laterality: Left;   BREAST RECONSTRUCTION WITH PLACEMENT OF TISSUE EXPANDER AND FLEX HD (ACELLULAR HYDRATED DERMIS) Right 04/06/2015   Procedure: RIGHT BREAST RECONSTRUCTION WITH PLACEMENT OF TISSUE EXPANDER, POSSIBLE ACELLULAR DERMIS;  Surgeon: Glenna Fellows, MD;  Location: [facility];  Service: Plastics;  Laterality: Right;   BREAST REDUCTION SURGERY Left 08/03/2015    Procedure: LEFT BREAST REDUCTION FOR SYMMETRY  (BREAST);  Surgeon: Glenna Fellows, MD;  Location: [facility];  Service: Plastics;  Laterality: Left;   CAPSULECTOMY Right 06/24/2022   Procedure: CAPSULECTOMY;  Surgeon: Glenna Fellows, MD;  Location: [facility];  Service: Plastics;  Laterality: Right;   CHOLECYSTECTOMY     GASTRIC BYPASS     LIPOSUCTION WITH LIPOFILLING N/A 08/03/2015   Procedure: LIPOSUCTION WITH LIPOFILLING ;  Surgeon: Glenna Fellows, MD;  Location: [facility];  Service: Plastics;  Laterality: N/A;   MASTECTOMY Right 2017   MASTECTOMY W/ SENTINEL NODE BIOPSY Right    MASTECTOMY W/ SENTINEL NODE BIOPSY Right 04/06/2015   Procedure: RIGHT MASTECTOMY WITH SENTINEL LYMPH NODE BIOPSY;  Surgeon: Ovidio Kin, MD;  Location: [facility];  Service: General;  Laterality: Right;   REDUCTION MAMMAPLASTY Left 2017   REMOVAL OF BILATERAL TISSUE EXPANDERS WITH PLACEMENT OF BILATERAL BREAST IMPLANTS Right 08/03/2015   Procedure: REMOVAL OF RIGHT TISSUE EXPANDERS WITH PLACEMENT OF IMPLANT;  Surgeon: Glenna Fellows, MD;  Location: [facility];  Service: Plastics;  Laterality: Right;   TUBAL LIGATION     Social History   Socioeconomic History   Marital status: Married    Spouse name: Not on file   Number of children: 2   Years of education: Not on file   Highest education level: Not on file  Occupational History   Not on file  Tobacco Use   Smoking status: Never   Smokeless tobacco: Never  Vaping Use   Vaping status:  Never Used  Substance and Sexual Activity   Alcohol use: Yes    Alcohol/week: 4.0 - 5.0 standard drinks of alcohol    Types: 4 - 5 Standard drinks or equivalent per week    Comment: wine and beer on weekends   Drug use: No   Sexual activity: Yes    Birth control/protection: Surgical  Other Topics Concern   Not on file  Social History Narrative   Not on file   Social Drivers of Health   Financial  Resource Strain: Not on file  Food Insecurity: Not on file  Transportation Needs: Not on file  Physical Activity: Not on file  Stress: Not on file  Social Connections: Unknown (05/14/2021)   Received from [facility], [facility]   Social Network    Social Network: Not on file   Family History  Problem Relation Age of Onset   Diabetes Mother    Hypertension Mother    Spinal muscular atrophy Mother    Irregular heart beat Mother    Anxiety disorder Mother    Arthritis Mother    Cancer Other        maternal great grandfather dx. unspecified type cancer   Heart attack Maternal Grandmother    Heart attack Maternal Uncle    Cancer Cousin        paternal 1st cousin dx. with cancer that was typically a childhood cancer   Breast cancer Neg Hx    No Known Allergies Prior to Admission medications   Medication Sig Start Date End Date Taking? Authorizing Provider  apixaban (ELIQUIS) 5 MG TABS tablet Take 1 tablet (5 mg total) by mouth 2 (two) times daily. 12/15/22 01/14/23 Yes Eustace Pen, PA-C  Biotin 5000 MCG TABS Take 5,000 mcg by mouth daily.   Yes [provider]  diclofenac Sodium (VOLTAREN) 1 % GEL Apply 2-4 g topically 4 (four) times daily as needed (pain.).   Yes [provider]  naproxen sodium (ALEVE) 220 MG tablet Take 440-660 mg by mouth 2 (two) times daily as needed (pain.).   Yes [provider]  OVER THE COUNTER MEDICATION Take 1 tablet by mouth daily. Neuriva   Yes [provider]  traMADol (ULTRAM) 50 MG tablet Take 50 mg by mouth daily as needed (pain.). 12/07/22 01/06/23 Yes [provider]  triamterene-hydrochlorothiazide (DYAZIDE) 37.5-25 MG capsule TAKE 1 CAPSULE BY MOUTH EVERY DAY 08/29/22  Yes Causey, Larna Daughters, NP  valACYclovir (VALTREX) 500 MG tablet TAKE 1 TABLET (500 MG TOTAL) BY MOUTH DAILY. 07/25/22  Yes Causey, Larna Daughters, NP  venlafaxine XR (EFFEXOR-XR) 37.5 MG 24 hr capsule TAKE 1 CAPSULE BY MOUTH  DAILY WITH BREAKFAST. 08/18/22  Yes Causey, Larna Daughters, NP  Vitamin D, Ergocalciferol, (DRISDOL) 1.25 MG (50000 UNIT) CAPS capsule TAKE 1 CAPSULE BY MOUTH ONE TIME PER WEEK Patient taking differently: Take 50,000 Units by mouth every Monday. 06/27/22  Yes Causey, Larna Daughters, NP  oxyCODONE (OXY IR/ROXICODONE) 5 MG immediate release tablet Take 5 mg by mouth every 6 (six) hours as needed. 12/16/22   [provider]     Positive ROS: All other systems have been reviewed and were otherwise negative with the exception of those mentioned in the HPI and as above.  Physical Exam:  Vitals:   12/27/22 0548  BP: (!) 149/87  Pulse: 67  Resp: 17  Temp: 98.2 F (36.8 C)  SpO2: 100%   General: Alert, no acute distress Cardiovascular: No pedal edema Respiratory: No cyanosis,  no use of accessory musculature GI: No organomegaly, abdomen is soft and non-tender Skin: No lesions in the area of chief complaint Neurologic: Sensation intact distally Psychiatric: Patient is competent for consent with normal mood and affect Lymphatic: No axillary or cervical lymphadenopathy  MUSCULOSKELETAL: Left ankle in a short leg splint.  There is deformity through the ankle.  Exposed forefoot is warm and well-perfused with intact sensation.  No tenderness proximal to the splint.  Assessment: Left distal tibia fracture involving the weightbearing surface with associated fibula fracture   Plan: Plan for open treatment of her tibia with fibula.  She would likely require syndesmosis fixation as well for added stability and fixation.  The patient has been weightbearing through the splint despite instruction not to weight-bear.  I reinforced the instructions this morning and cautioned her against weightbearing postoperatively as there would be risk for cutting out of the hardware and need for further surgery.  She voiced understanding that she would not likely be cleared to weight-bear until 8 weeks  postop.  We discussed the risks, benefits and alternatives of surgery which include but are not limited to wound healing complications, infection, nonunion, malunion, need for further surgery, damage to surrounding structures and continued pain.  They understand there is no guarantees to an acceptable outcome.  After weighing these risks they opted to proceed with surgery.     Terance Hart, MD    12/27/2022 7:08 AM

## 2022-12-27 NOTE — Anesthesia Procedure Notes (Signed)
Anesthesia Regional Block: Popliteal block   Pre-Anesthetic Checklist: , timeout performed,  Correct Patient, Correct Site, Correct Laterality,  Correct Procedure, Correct Position, site marked,  Risks and benefits discussed,  Surgical consent,  Pre-op evaluation,  At surgeon's request and post-op pain management  Laterality: Left and Lower  Prep: chloraprep       Needles:  Injection technique: Single-shot  Needle Type: Echogenic Needle     Needle Length: 9cm  Needle Gauge: 21     Additional Needles:   Procedures:,,,, ultrasound used (permanent image in chart),,    Narrative:  Start time: 12/27/2022 7:12 AM End time: 12/27/2022 7:18 AM Injection made incrementally with aspirations every 5 mL.  Performed by: Personally  Anesthesiologist: Jairo Ben, MD  Additional Notes: Pt identified in Holding room.  Monitors applied. Working IV access confirmed. Timeout, Sterile prep L lateral distal femur/knee.  #21ga ECHOgenic Arrow block needle to sciatic nerve at split in popliteal fossa with US guidance.  30cc 0.5% Bupivacaine 1:200k epi injected incrementally after negative test dose.  Patient asymptomatic, VSS, no heme aspirated, tolerated well.   Sandford Craze, MD

## 2022-12-27 NOTE — Anesthesia Procedure Notes (Signed)
Procedure Name: LMA Insertion Date/Time: 12/27/2022 7:40 AM  Performed by: Hilda Lias, CRNAPre-anesthesia Checklist: Patient identified, Emergency Drugs available, Suction available and Patient being monitored Patient Re-evaluated:Patient Re-evaluated prior to induction Oxygen Delivery Method: Circle System Utilized Preoxygenation: Pre-oxygenation with 100% oxygen Induction Type: IV induction Ventilation: Mask ventilation without difficulty LMA: LMA inserted LMA Size: 4.0 Number of attempts: 1 Airway Equipment and Method: Bite block Placement Confirmation: positive ETCO2 Tube secured with: Tape Dental Injury: Teeth and Oropharynx as per pre-operative assessment

## 2022-12-30 ENCOUNTER — Other Ambulatory Visit: Payer: Self-pay

## 2022-12-30 ENCOUNTER — Telehealth: Payer: Self-pay | Admitting: Adult Health

## 2022-12-30 ENCOUNTER — Telehealth: Payer: Self-pay

## 2022-12-30 DIAGNOSIS — E538 Deficiency of other specified B group vitamins: Secondary | ICD-10-CM

## 2022-12-30 NOTE — Telephone Encounter (Signed)
Placed a referral to dermatologist for pt. Called pt to inform pt that referral is in and some one should reach out for appt day and time.

## 2022-12-30 NOTE — Telephone Encounter (Signed)
Rescheduled appointments per patients request. Patient is aware of the changes made to her upcoming appointments.

## 2023-01-02 ENCOUNTER — Ambulatory Visit (HOSPITAL_BASED_OUTPATIENT_CLINIC_OR_DEPARTMENT_OTHER): Payer: BC Managed Care – PPO

## 2023-01-02 ENCOUNTER — Encounter (HOSPITAL_COMMUNITY): Payer: Self-pay | Admitting: Orthopaedic Surgery

## 2023-01-02 ENCOUNTER — Ambulatory Visit (HOSPITAL_COMMUNITY): Payer: BC Managed Care – PPO | Attending: Cardiology

## 2023-01-02 ENCOUNTER — Encounter: Payer: Self-pay | Admitting: Adult Health

## 2023-01-02 DIAGNOSIS — R55 Syncope and collapse: Secondary | ICD-10-CM | POA: Diagnosis present

## 2023-01-02 DIAGNOSIS — R9431 Abnormal electrocardiogram [ECG] [EKG]: Secondary | ICD-10-CM | POA: Insufficient documentation

## 2023-01-02 DIAGNOSIS — I517 Cardiomegaly: Secondary | ICD-10-CM | POA: Diagnosis not present

## 2023-01-02 DIAGNOSIS — Z8669 Personal history of other diseases of the nervous system and sense organs: Secondary | ICD-10-CM | POA: Insufficient documentation

## 2023-01-02 LAB — ECHOCARDIOGRAM COMPLETE
Area-P 1/2: 3.66 cm2
S' Lateral: 2.6 cm

## 2023-01-02 LAB — MYOCARDIAL AMYLOID PLANAR & SPECT: H/CL Ratio: 1.01

## 2023-01-02 MED ORDER — TECHNETIUM TC 99M PYROPHOSPHATE
22.0000 | Freq: Once | INTRAVENOUS | Status: AC
  Administered 2023-01-02: 22 via INTRAVENOUS

## 2023-01-04 ENCOUNTER — Encounter: Payer: Self-pay | Admitting: Adult Health

## 2023-01-09 ENCOUNTER — Encounter: Payer: Self-pay | Admitting: Adult Health

## 2023-01-10 ENCOUNTER — Encounter: Payer: Self-pay | Admitting: Adult Health

## 2023-01-10 ENCOUNTER — Ambulatory Visit: Payer: BC Managed Care – PPO | Admitting: Neurology

## 2023-01-12 ENCOUNTER — Encounter: Payer: Self-pay | Admitting: Internal Medicine

## 2023-01-12 ENCOUNTER — Telehealth: Payer: Self-pay | Admitting: Hematology and Oncology

## 2023-01-12 NOTE — Telephone Encounter (Signed)
 Patient called to reschedule appt, patient recently fell and broke leg in two or more places. Patient unable to come in; unable to walk.

## 2023-01-16 ENCOUNTER — Ambulatory Visit: Payer: BC Managed Care – PPO | Admitting: Neurology

## 2023-01-16 ENCOUNTER — Other Ambulatory Visit: Payer: BC Managed Care – PPO

## 2023-01-16 ENCOUNTER — Ambulatory Visit: Payer: BC Managed Care – PPO

## 2023-01-17 ENCOUNTER — Ambulatory Visit: Payer: BC Managed Care – PPO

## 2023-01-17 ENCOUNTER — Other Ambulatory Visit: Payer: BC Managed Care – PPO

## 2023-01-18 ENCOUNTER — Encounter: Payer: Self-pay | Admitting: Adult Health

## 2023-01-20 ENCOUNTER — Encounter: Payer: Self-pay | Admitting: Adult Health

## 2023-02-03 ENCOUNTER — Encounter: Payer: Self-pay | Admitting: Adult Health

## 2023-02-03 ENCOUNTER — Ambulatory Visit: Payer: 59 | Attending: Internal Medicine | Admitting: Internal Medicine

## 2023-02-03 VITALS — BP 124/78 | HR 86 | Ht 71.0 in | Wt 217.0 lb

## 2023-02-03 DIAGNOSIS — F1010 Alcohol abuse, uncomplicated: Secondary | ICD-10-CM

## 2023-02-03 DIAGNOSIS — I4891 Unspecified atrial fibrillation: Secondary | ICD-10-CM | POA: Diagnosis not present

## 2023-02-03 DIAGNOSIS — I480 Paroxysmal atrial fibrillation: Secondary | ICD-10-CM

## 2023-02-03 DIAGNOSIS — I10 Essential (primary) hypertension: Secondary | ICD-10-CM | POA: Diagnosis not present

## 2023-02-03 MED ORDER — APIXABAN 5 MG PO TABS: 5.0000 mg | ORAL_TABLET | Freq: Two times a day (BID) | ORAL | 3 refills | Status: DC

## 2023-02-03 MED ORDER — METOPROLOL SUCCINATE ER 25 MG PO TB24: 25.0000 mg | ORAL_TABLET | Freq: Every day | ORAL | 3 refills | Status: DC

## 2023-02-03 NOTE — Patient Instructions (Signed)
Medication Instructions:  Your physician has recommended you make the following change in your medication:  START: metoprolol succinate 25 mg  by mouth once daily at bedtime  *If you need a refill on your cardiac medications before your next appointment, please call your pharmacy*   Lab Work: NONE  If you have labs (blood work) drawn today and your tests are completely normal, you will receive your results only by: MyChart Message (if you have MyChart) OR A paper copy in the mail If you have any lab test that is abnormal or we need to change your treatment, we will call you to review the results.   Testing/Procedures: NONE   Follow-Up: At [facility], you and your health needs are our priority.  As part of our continuing mission to provide you with exceptional heart care, we have created designated Provider Care Teams.  These Care Teams include your primary Cardiologist (physician) and Advanced Practice Providers (APPs -  Physician Assistants and Nurse Practitioners) who all work together to provide you with the care you need, when you need it.   Your next appointment:   6 month(s)  Provider:   Riley Lam, MD

## 2023-02-03 NOTE — Progress Notes (Signed)
  Cardiology Office Note:  .    Date:  02/03/2023  ID:  Brooke [name], DOB 1965/03/27, MRN [id number] PCP: Patient, No Pcp Per  [facility] Providers Cardiologist:  None     CC: Follow up AF  History of Present Illness: .    Brooke [name] is a 58 y.o. 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  She is experiencing episodes of rapid heart rate, which lead to low blood pressure and fainting. She is currently prescribed metoprolol to manage her atrial fibrillation and is instructed to take it at night due to its potential to cause drowsiness, which she finds beneficial for her sleep difficulties. She is also seeking a refill for her blood thinner medication.  She is dealing with anxiety and is coordinating with another healthcare provider for her anxiety medications. She expresses a need for medication that addresses both her anxiety and atrial fibrillation.  No current symptoms of shortness of breath, palpitations, or neuropathic symptoms like numbness and tingling.  Relevant histories: .  Social comes with sister (from [place]), hx of Val 122lle with one family member with ATTR-CA, no ATTR- PN; one sister is going to be in [place] ROS: As per HPI.   Physical Exam:    VS:  BP 124/78 (BP Location: Left Arm)   Pulse 86   Ht 5\' 11"  (1.803 m)   Wt 217 lb (98.4 kg)   LMP 04/05/2016 (Approximate)   SpO2 99%   BMI 30.27 kg/m    Wt Readings from Last 3 Encounters:  02/03/23 217 lb (98.4 kg)  12/27/22 217 lb (98.4 kg)  12/15/22 217 lb (98.4 kg)    Gen: mild distress  Neck: No JVD Cardiac: No Rubs or Gallops, no murmur, RRR +2 radial pulses Respiratory: Clear to auscultation bilaterally, normal  effort, normal  respiratory rate GI: Soft, nontender, non-distended  MS: No  edema;  Left leg is in a cast Integument: Skin feels warm Neuro:  At time of evaluation, alert and oriented to person/place/time/situation  Psych: anxious affect, patient feels worried   ASSESSMENT AND PLAN: .    Atrial Fibrillation (AFib)- Paroxysmal  - CHADSVASC NA- ATTR gene mutation - Alcohol use Chronic condition with episodes of rapid heart rate and hypotension, leading to syncope. Managed with Eliquis. Plan to add metoprolol to control heart rate and aid with sleep. Discussed risks of drowsiness and hypotension, and potential benefits of improved heart rate control and sleep. Advised to avoid excessive alcohol consumption as it may exacerbate side effects. Many patients tolerate this combination well. - Refill Eliquis - Prescribe metoprolol succinate 25 mg PO daily at night - Monitor for side effects such as drowsiness and hypotension - Advise to avoid excessive alcohol consumption - Follow-up in 6 months - negative PYP - low threshold to explore ATTR-PN  HTN - controlled  Breast Cancer hx - no evidence of cardiac risk changes due to therapy, echo 01/02/2023      Riley Lam, MD FASE [facility] Cardiologist [facility]  [address] [place], [address] [place], [place] [address] [phone]  4:26 PM

## 2023-02-04 ENCOUNTER — Emergency Department (HOSPITAL_COMMUNITY): Payer: 59

## 2023-02-04 ENCOUNTER — Observation Stay (HOSPITAL_COMMUNITY)
Admission: EM | Admit: 2023-02-04 | Discharge: 2023-02-05 | Disposition: A | Discharge status: Home / Self Care | Payer: 59 | Attending: Internal Medicine | Admitting: Internal Medicine

## 2023-02-04 ENCOUNTER — Other Ambulatory Visit: Payer: Self-pay

## 2023-02-04 ENCOUNTER — Encounter (HOSPITAL_COMMUNITY): Payer: Self-pay

## 2023-02-04 DIAGNOSIS — Z853 Personal history of malignant neoplasm of breast: Secondary | ICD-10-CM | POA: Insufficient documentation

## 2023-02-04 DIAGNOSIS — F411 Generalized anxiety disorder: Secondary | ICD-10-CM | POA: Insufficient documentation

## 2023-02-04 DIAGNOSIS — F1090 Alcohol use, unspecified, uncomplicated: Secondary | ICD-10-CM | POA: Diagnosis not present

## 2023-02-04 DIAGNOSIS — I480 Paroxysmal atrial fibrillation: Principal | ICD-10-CM | POA: Insufficient documentation

## 2023-02-04 DIAGNOSIS — C50211 Malignant neoplasm of upper-inner quadrant of right female breast: Secondary | ICD-10-CM

## 2023-02-04 DIAGNOSIS — Z79899 Other long term (current) drug therapy: Secondary | ICD-10-CM | POA: Insufficient documentation

## 2023-02-04 DIAGNOSIS — R002 Palpitations: Secondary | ICD-10-CM | POA: Diagnosis present

## 2023-02-04 DIAGNOSIS — Z1152 Encounter for screening for COVID-19: Secondary | ICD-10-CM | POA: Insufficient documentation

## 2023-02-04 DIAGNOSIS — I10 Essential (primary) hypertension: Secondary | ICD-10-CM | POA: Insufficient documentation

## 2023-02-04 DIAGNOSIS — Z170 Estrogen receptor positive status [ER+]: Secondary | ICD-10-CM

## 2023-02-04 DIAGNOSIS — I4891 Unspecified atrial fibrillation: Secondary | ICD-10-CM | POA: Diagnosis not present

## 2023-02-04 DIAGNOSIS — Z789 Other specified health status: Secondary | ICD-10-CM

## 2023-02-04 DIAGNOSIS — F330 Major depressive disorder, recurrent, mild: Secondary | ICD-10-CM | POA: Diagnosis not present

## 2023-02-04 DIAGNOSIS — R000 Tachycardia, unspecified: Secondary | ICD-10-CM

## 2023-02-04 LAB — CBC WITH DIFFERENTIAL/PLATELET
Abs Immature Granulocytes: 0.02 10*3/uL (ref 0.00–0.07)
Basophils Absolute: 0.1 10*3/uL (ref 0.0–0.1)
Basophils Relative: 1 %
Eosinophils Absolute: 0 10*3/uL (ref 0.0–0.5)
Eosinophils Relative: 0 %
HCT: 34.6 % — ABNORMAL LOW (ref 36.0–46.0)
Hemoglobin: 11.7 g/dL — ABNORMAL LOW (ref 12.0–15.0)
Immature Granulocytes: 0 %
Lymphocytes Relative: 15 %
Lymphs Abs: 1 10*3/uL (ref 0.7–4.0)
MCH: 31.6 pg (ref 26.0–34.0)
MCHC: 33.8 g/dL (ref 30.0–36.0)
MCV: 93.5 fL (ref 80.0–100.0)
Monocytes Absolute: 0.3 10*3/uL (ref 0.1–1.0)
Monocytes Relative: 4 %
Neutro Abs: 5.4 10*3/uL (ref 1.7–7.7)
Neutrophils Relative %: 80 %
Platelets: 258 10*3/uL (ref 150–400)
RBC: 3.7 MIL/uL — ABNORMAL LOW (ref 3.87–5.11)
RDW: 15.5 % (ref 11.5–15.5)
WBC: 6.8 10*3/uL (ref 4.0–10.5)
nRBC: 0 % (ref 0.0–0.2)

## 2023-02-04 LAB — APTT: aPTT: 25 s (ref 24–36)

## 2023-02-04 LAB — COMPREHENSIVE METABOLIC PANEL
ALT: 52 U/L — ABNORMAL HIGH (ref 0–44)
AST: 80 U/L — ABNORMAL HIGH (ref 15–41)
Albumin: 3.9 g/dL (ref 3.5–5.0)
Alkaline Phosphatase: 141 U/L — ABNORMAL HIGH (ref 38–126)
Anion gap: 18 — ABNORMAL HIGH (ref 5–15)
BUN: 18 mg/dL (ref 6–20)
CO2: 18 mmol/L — ABNORMAL LOW (ref 22–32)
Calcium: 8.7 mg/dL — ABNORMAL LOW (ref 8.9–10.3)
Chloride: 99 mmol/L (ref 98–111)
Creatinine, Ser: 1.12 mg/dL — ABNORMAL HIGH (ref 0.44–1.00)
GFR, Estimated: 57 mL/min — ABNORMAL LOW (ref >60)
Glucose, Bld: 138 mg/dL — ABNORMAL HIGH (ref 70–99)
Potassium: 3.9 mmol/L (ref 3.5–5.1)
Sodium: 135 mmol/L (ref 135–145)
Total Bilirubin: 1.4 mg/dL — ABNORMAL HIGH (ref 0.0–1.2)
Total Protein: 7 g/dL (ref 6.5–8.1)

## 2023-02-04 LAB — RAPID URINE DRUG SCREEN, HOSP PERFORMED
Amphetamines: NOT DETECTED
Barbiturates: NOT DETECTED
Benzodiazepines: NOT DETECTED
Cocaine: NOT DETECTED
Opiates: NOT DETECTED
Tetrahydrocannabinol: NOT DETECTED

## 2023-02-04 LAB — RESP PANEL BY RT-PCR (RSV, FLU A&B, COVID)  RVPGX2
Influenza A by PCR: NEGATIVE
Influenza B by PCR: NEGATIVE
Resp Syncytial Virus by PCR: NEGATIVE
SARS Coronavirus 2 by RT PCR: NEGATIVE

## 2023-02-04 LAB — TSH: TSH: 1.315 u[IU]/mL (ref 0.350–4.500)

## 2023-02-04 LAB — TROPONIN I (HIGH SENSITIVITY)
Troponin I (High Sensitivity): 38 ng/L — ABNORMAL HIGH (ref <18)
Troponin I (High Sensitivity): 93 ng/L — ABNORMAL HIGH (ref <18)

## 2023-02-04 LAB — FOLATE: Folate: 7.7 ng/mL (ref >5.9)

## 2023-02-04 LAB — PROTIME-INR
INR: 1.4 — ABNORMAL HIGH (ref 0.8–1.2)
Prothrombin Time: 17 s — ABNORMAL HIGH (ref 11.4–15.2)

## 2023-02-04 LAB — MAGNESIUM: Magnesium: 1.9 mg/dL (ref 1.7–2.4)

## 2023-02-04 LAB — BETA-HYDROXYBUTYRIC ACID: Beta-Hydroxybutyric Acid: 1.8 mmol/L — ABNORMAL HIGH (ref 0.05–0.27)

## 2023-02-04 LAB — HIV ANTIBODY (ROUTINE TESTING W REFLEX): HIV Screen 4th Generation wRfx: NONREACTIVE

## 2023-02-04 MED ORDER — RAMELTEON 8 MG PO TABS
8.0000 mg | ORAL_TABLET | Freq: Every day | ORAL | Status: DC
  Administered 2023-02-04: 8 mg via ORAL
  Filled 2023-02-04 (×2): qty 1

## 2023-02-04 MED ORDER — ACETAMINOPHEN 650 MG RE SUPP: 650.0000 mg | Freq: Four times a day (QID) | RECTAL | Status: DC | PRN

## 2023-02-04 MED ORDER — THIAMINE MONONITRATE 100 MG PO TABS
100.0000 mg | ORAL_TABLET | Freq: Every day | ORAL | Status: DC
  Administered 2023-02-04 – 2023-02-05 (×2): 100 mg via ORAL
  Filled 2023-02-04 (×2): qty 1

## 2023-02-04 MED ORDER — SODIUM CHLORIDE 0.9% FLUSH
3.0000 mL | Freq: Two times a day (BID) | INTRAVENOUS | Status: DC
  Administered 2023-02-04 – 2023-02-05 (×2): 3 mL via INTRAVENOUS

## 2023-02-04 MED ORDER — ADULT MULTIVITAMIN W/MINERALS CH
1.0000 | ORAL_TABLET | Freq: Every day | ORAL | Status: DC
  Administered 2023-02-04 – 2023-02-05 (×2): 1 via ORAL
  Filled 2023-02-04 (×2): qty 1

## 2023-02-04 MED ORDER — AMIODARONE HCL IN DEXTROSE 360-4.14 MG/200ML-% IV SOLN: 30.0000 mg/h | INTRAVENOUS | Status: DC

## 2023-02-04 MED ORDER — APIXABAN 5 MG PO TABS
5.0000 mg | ORAL_TABLET | Freq: Two times a day (BID) | ORAL | Status: DC
Start: 2023-02-04 — End: 2023-02-05
  Administered 2023-02-04 – 2023-02-05 (×2): 5 mg via ORAL
  Filled 2023-02-04 (×2): qty 1

## 2023-02-04 MED ORDER — AMIODARONE LOAD VIA INFUSION
150.0000 mg | Freq: Once | INTRAVENOUS | Status: AC
  Administered 2023-02-04: 150 mg via INTRAVENOUS
  Filled 2023-02-04: qty 83.34

## 2023-02-04 MED ORDER — AMIODARONE HCL IN DEXTROSE 360-4.14 MG/200ML-% IV SOLN
60.0000 mg/h | INTRAVENOUS | Status: DC
  Administered 2023-02-04: 60 mg/h via INTRAVENOUS

## 2023-02-04 MED ORDER — LORAZEPAM 2 MG/ML IJ SOLN
1.0000 mg | Freq: Once | INTRAMUSCULAR | Status: AC
  Administered 2023-02-04: 1 mg via INTRAVENOUS
  Filled 2023-02-04: qty 1

## 2023-02-04 MED ORDER — ACETAMINOPHEN 325 MG PO TABS: 650.0000 mg | ORAL_TABLET | Freq: Four times a day (QID) | ORAL | Status: DC | PRN

## 2023-02-04 MED ORDER — POLYETHYLENE GLYCOL 3350 17 G PO PACK: 17.0000 g | PACK | Freq: Every day | ORAL | Status: DC | PRN

## 2023-02-04 MED ORDER — METOPROLOL SUCCINATE ER 25 MG PO TB24
25.0000 mg | ORAL_TABLET | Freq: Every day | ORAL | Status: DC
  Administered 2023-02-04: 25 mg via ORAL
  Filled 2023-02-04: qty 1

## 2023-02-04 MED ORDER — SODIUM CHLORIDE 0.9 % IV BOLUS
1000.0000 mL | Freq: Once | INTRAVENOUS | Status: AC
  Administered 2023-02-04: 1000 mL via INTRAVENOUS

## 2023-02-04 MED ORDER — TRIAMTERENE-HCTZ 37.5-25 MG PO TABS
1.0000 | ORAL_TABLET | Freq: Every day | ORAL | Status: DC
  Administered 2023-02-05: 1 via ORAL
  Filled 2023-02-04: qty 1

## 2023-02-04 NOTE — ED Provider Notes (Signed)
Accepted handoff at shift change from [facility]. Please see prior provider note for more detail.   Briefly: Patient is 58 y.o.   DDX: concern for Pmh AFIB, recent leg surgery for ankle fracture. Drinks alcohol and energy drinks -- question probably withdrawal. Heart beating fast and short of breath -- initially 200bpm, narrow complex tachycardia, gave her adenosine, slowed down to EKG showing 140s, rapid AFIB. Gave her cardizem. 130-150 with frequent PVCs on arrival, probably afib. Started amiodarone -- rate improved. Ativan for probably withdrawal. Spoke with Cardiology -- plan to stop amiodarone and monitor for ~2hours. Make sure she doesn't go into unstable rhythm. No recommendations from cardiology for medication changes  Plan: Delta troponin 93 from 38, certainly from demand ischemia.   Given degree of tachycardia on arrival, increased troponin, possible withdrawal state, anion gap, bicarb deficit I think the patient would benefit from admission for overnight observation despite converting him to normal cardiac rhythm.  I spoke with the hospitalist, Dr. Alinda Money who agrees with this plan.   Olene Floss, PA-C 02/04/23 1715    Gwyneth Sprout, MD 02/06/23 0003

## 2023-02-04 NOTE — ED Notes (Signed)
Placed pt on bed pan.

## 2023-02-04 NOTE — ED Triage Notes (Signed)
PT BIB [facility] from home after PT complained of dizziness that continued to persist with SOB. Hx of new onset Afib, and L leg surgery dec 24th [facility] had a  HR of 200 and PT received 6mg  adenosine and 20 of cardizem with 500 ml saline en route. PT aox4.

## 2023-02-04 NOTE — ED Provider Notes (Cosign Needed)
[name] Provider Note   CSN: [id number] Arrival date & time: 02/04/23  1224     History  Chief Complaint  Patient presents with   Shortness of Breath   Dizziness   Chest Pain    Brooke [name] is a 58 y.o. female.  Patient complains of a rapid heart rate.  Patient reports that she saw cardiology yesterday and everything was normal.  Patient called [facility] because her heart was racing and she was short of breath.  Patient reports that she has had atrial fibrillation in the past.  Patient reports she is currently on Eliquis.  Patient reports she has missed some dosages.  Patient has been on metoprolol for A-fib in the past.  EMS reports patient was tachycardic to 180 to  200 when they first arrived.  They gave adenosine.  EMS reports patient did have a decrease in heart rate.  EMS reports giving Cardizem and patient's heart rate decreased to the 120s.  20 she is a 58 year old she  The history is provided by the patient. No language interpreter was used.  Shortness of Breath Associated symptoms: chest pain   Dizziness Associated symptoms: chest pain and shortness of breath   Chest Pain Associated symptoms: dizziness and shortness of breath        Home Medications Prior to Admission medications   Medication Sig Start Date End Date Taking? Authorizing Provider  apixaban (ELIQUIS) 5 MG TABS tablet Take 1 tablet (5 mg total) by mouth 2 (two) times daily. 02/03/23   Christell Constant, MD  Biotin 5000 MCG TABS Take 5,000 mcg by mouth daily.    [provider]  cyclobenzaprine (FEXMID) 7.5 MG tablet Take 1 tablet (7.5 mg total) by mouth 3 (three) times daily as needed for muscle spasms. 12/27/22   [place], Jesse J, PA-C  diclofenac Sodium (VOLTAREN) 1 % GEL Apply 2-4 g topically 4 (four) times daily as needed (pain.).    [provider]  metoprolol succinate (TOPROL XL) 25 MG 24 hr tablet Take 1 tablet (25 mg  total) by mouth at bedtime. 02/03/23   Riley Lam A, MD  naproxen sodium (ALEVE) 220 MG tablet Take 440-660 mg by mouth 2 (two) times daily as needed (pain.).    [provider]  OVER THE COUNTER MEDICATION Take 1 tablet by mouth daily. Neuriva    [provider]  oxyCODONE (ROXICODONE) 5 MG immediate release tablet Take 1 tablet (5 mg total) by mouth every 4 (four) hours as needed. 12/27/22   Terance Hart, MD  oxyCODONE (ROXICODONE) 5 MG immediate release tablet Take 1 tablet (5 mg total) by mouth every 4 (four) hours as needed. 12/27/22   [place], Jesse J, PA-C  triamterene-hydrochlorothiazide (DYAZIDE) 37.5-25 MG capsule TAKE 1 CAPSULE BY MOUTH EVERY DAY 08/29/22   [name], NP  valACYclovir (VALTREX) 500 MG tablet TAKE 1 TABLET (500 MG TOTAL) BY MOUTH DAILY. 07/25/22   [name], NP  venlafaxine XR (EFFEXOR-XR) 37.5 MG 24 hr capsule TAKE 1 CAPSULE BY MOUTH DAILY WITH BREAKFAST. 08/18/22   [name], NP  Vitamin D, Ergocalciferol, (DRISDOL) 1.25 MG (50000 UNIT) CAPS capsule TAKE 1 CAPSULE BY MOUTH ONE TIME PER WEEK Patient taking differently: Take 50,000 Units by mouth every Monday. 06/27/22   [name], NP      Allergies    Patient has no known allergies.    Review of Systems  Review of Systems  Respiratory:  Positive for shortness of breath.   Cardiovascular:  Positive for chest pain.  Neurological:  Positive for dizziness.  All other systems reviewed and are negative.   Physical Exam Updated Vital Signs BP 124/78 (BP Location: Right Arm)   Pulse (!) 174   Temp 98 F (36.7 C) (Oral)   Resp 18   Ht 5\' 11"  (1.803 m)   Wt 98 kg   LMP 04/05/2016 (Approximate)   SpO2 100%   BMI 30.13 kg/m  Physical Exam Vitals and nursing note reviewed.  Constitutional:      General: She is in acute distress.  HENT:     Head: Normocephalic.  Cardiovascular:     Rate and Rhythm: Tachycardia present.   Pulmonary:     Effort: Pulmonary effort is normal. Tachypnea present.     Breath sounds: No decreased breath sounds.  Abdominal:     General: There is no distension.     Palpations: Abdomen is soft.  Musculoskeletal:        General: Normal range of motion.     Cervical back: Normal range of motion.  Skin:    General: Skin is warm.  Neurological:     Mental Status: She is alert and oriented to person, place, and time.  Psychiatric:        Mood and Affect: Mood normal.     ED Results / Procedures / Treatments   Labs (all labs ordered are listed, but only abnormal results are displayed) Labs Reviewed  CBC WITH DIFFERENTIAL/PLATELET - Abnormal; Notable for the following components:      Result Value   RBC 3.70 (*)    Hemoglobin 11.7 (*)    HCT 34.6 (*)    All other components within normal limits  PROTIME-INR - Abnormal; Notable for the following components:   Prothrombin Time 17.0 (*)    INR 1.4 (*)    All other components within normal limits  RESP PANEL BY RT-PCR (RSV, FLU A&B, COVID)  RVPGX2  APTT  COMPREHENSIVE METABOLIC PANEL  MAGNESIUM  RAPID URINE DRUG SCREEN, HOSP PERFORMED  TSH  TROPONIN I (HIGH SENSITIVITY)    EKG None  Radiology DG Chest Portable 1 View Result Date: 02/04/2023 CLINICAL DATA:  Shortness of breath. EXAM: PORTABLE CHEST 1 VIEW COMPARISON:  December 08, 2022. FINDINGS: Stable cardiomediastinal silhouette. Both lungs are clear. The visualized skeletal structures are unremarkable. IMPRESSION: No active disease. Electronically Signed   By: Lupita Raider M.D.   On: 02/04/2023 13:14    Procedures Procedures    Medications Ordered in ED Medications  amiodarone (NEXTERONE) 1.8 mg/mL load via infusion 150 mg (150 mg Intravenous Bolus from Bag 02/04/23 1247)    Followed by  amiodarone (NEXTERONE PREMIX) 360-4.14 MG/200ML-% (1.8 mg/mL) IV infusion (60 mg/hr Intravenous New Bag/Given 02/04/23 1259)    Followed by  amiodarone (NEXTERONE PREMIX)  360-4.14 MG/200ML-% (1.8 mg/mL) IV infusion (has no administration in time range)  sodium chloride 0.9 % bolus 1,000 mL (1,000 mLs Intravenous New Bag/Given 02/04/23 1245)    ED Course/ Medical Decision Making/ A&P                                 Medical Decision Making Amount and/or Complexity of Data Reviewed Labs: ordered. Radiology: ordered. Discussion of management or test interpretation with external provider(s): I discussed patient with cardiology Dr. Elberta Fortis cardiology advised stopping amiodarone drip as  patient is currently in normal sinus.  He advised if patient stays in normal sinus she can continue on her current medications and follow-up in the A-fib clinic next week.  Risk Prescription drug management.  Rn reports pt is shaky, Pt has a history of alcohol abuse.  Sister reported pt has not been sleeping and drinking energy drinks   Pt's care turned over to [facility]       Final Clinical Impression(s) / ED Diagnoses Final diagnoses:  Atrial fibrillation, unspecified type [facility])  Tachycardia    Rx / DC Orders ED Discharge Orders     None         Elson Areas, [place] 02/04/23 1522

## 2023-02-04 NOTE — ED Notes (Signed)
Patient placed on 2lpm at request of PA

## 2023-02-04 NOTE — H&P (Signed)
History and Physical   Brooke [name]:[id number] DOB: October 24, 1965 DOA: 02/04/2023  PCP: Deeann Saint, MD   Patient coming from: Home   Chief Complaint: Palpitations  HPI: Brooke [name] is a 58 y.o. female with medical history significant of hypertension, paroxysmal atrial fibrillation, history of breast cancer, depression, anxiety, alcohol use presenting with palpitations and tachycardia.  Patient with known history of atrial fibrillation, was actually seen at cardiology office yesterday and felt normal at that time with no tachycardia.  Today, she did develop significant tachycardia and palpitations and called EMS.  On arrival EMS noted heart rate to be 1 80-200 and patient received adenosine with some improvement in heart rate and then received diltiazem following this with improvement of heart rate to the 120s.  Patient is on Eliquis and metoprolol outpatient but is off with the time of her Eliquis some days.  Reports drinking half a bottle to a full bottle of wine every day or every other day.  Last drink was couple days ago.  Noted to have some tremors in the ED which appear to have been improved and may have been related to anxiety about her episode today.  Some of patient family concerned that she may be drinking more than she reports per EDP (some family members were not present when I spoke with patient).  She has had decreased sleep recently and did have an energy drink as well.   Denies fevers, chills, chest pain, abdominal pain, constipation, diarrhea, nausea, vomiting.  Patient did report having episodes of rapid heart rate, and fainting due to lower blood pressures to her cardiologist during recent visit as well.  ED Course: Vital signs in ED notable for heart rate in the 80s to 170s, currently normal sinus rhythm with normal rate.  Blood pressure 120s to 140 systolic, initially requiring 2 L to maintain saturations, now on room air.  Lab workup included CMP with bicarb 18, gap  18, creatinine 1.12 which is near baseline, glucose 130, calcium 8.7, AST 80, ALT 52, alk phos 141, T. bili 1.7.  CBC showed hemoglobin stable 11.7.  PT and INR mildly elevated at 17 and 1.4 respectively.  PTT normal.  Troponin trend 38, 93.  TSH normal.  Rester panel for flu COVID RSV pending.  UDS negative.  Chest x-ray showed no acute normality.  Patient placed on amiodarone infusion with improvement of heart rate and then conversion to sinus rhythm.  Patient also received Ativan and 1 L IV fluids in the ED.  Cardiology was consulted and EP provider was on-call who recommended stopping any of infusion due to conversion to sinus rhythm and recommended outpatient follow-up with A-fib clinic on discharge.   Review of Systems: As per HPI otherwise all other systems reviewed and are negative.  Past Medical History:  Diagnosis Date   Alcohol use disorder, mild, abuse 06/26/2019   Anemia    Anxiety    Breast cancer (HCC)    Breast cancer of upper-inner quadrant of right female breast (HCC) 02/18/2015   Chronic headaches    Depression    Fatigue    Hot flashes    Hypertension    PAF (paroxysmal atrial fibrillation) (HCC) 12/08/2022   Syncope 11/2022   Yeast infection     Past Surgical History:  Procedure Laterality Date   BREAST IMPLANT REMOVAL Right 06/24/2022   Procedure: REMOVAL BREAST IMPLANT;  Surgeon: Glenna Fellows, MD;  Location: [facility];  Service: Plastics;  Laterality: Right;  BREAST LUMPECTOMY Left 2017   BREAST LUMPECTOMY WITH RADIOACTIVE SEED LOCALIZATION Left 04/06/2015   BREAST LUMPECTOMY WITH RADIOACTIVE SEED LOCALIZATION Left 04/06/2015   Procedure: LEFT BREAST LUMPECTOMY WITH RADIOACTIVE SEED LOCALIZATION;  Surgeon: Ovidio Kin, MD;  Location: [facility];  Service: General;  Laterality: Left;   BREAST RECONSTRUCTION WITH PLACEMENT OF TISSUE EXPANDER AND FLEX HD (ACELLULAR HYDRATED DERMIS) Right 04/06/2015   Procedure: RIGHT BREAST RECONSTRUCTION WITH PLACEMENT  OF TISSUE EXPANDER, POSSIBLE ACELLULAR DERMIS;  Surgeon: Glenna Fellows, MD;  Location: [facility] OR;  Service: Plastics;  Laterality: Right;   BREAST REDUCTION SURGERY Left 08/03/2015   Procedure: LEFT BREAST REDUCTION FOR SYMMETRY  (BREAST);  Surgeon: Glenna Fellows, MD;  Location: [facility];  Service: Plastics;  Laterality: Left;   CAPSULECTOMY Right 06/24/2022   Procedure: CAPSULECTOMY;  Surgeon: Glenna Fellows, MD;  Location: [facility];  Service: Plastics;  Laterality: Right;   CHOLECYSTECTOMY     GASTRIC BYPASS     LIPOSUCTION WITH LIPOFILLING N/A 08/03/2015   Procedure: LIPOSUCTION WITH LIPOFILLING ;  Surgeon: Glenna Fellows, MD;  Location: [facility];  Service: Plastics;  Laterality: N/A;   MASTECTOMY Right 2017   MASTECTOMY W/ SENTINEL NODE BIOPSY Right    MASTECTOMY W/ SENTINEL NODE BIOPSY Right 04/06/2015   Procedure: RIGHT MASTECTOMY WITH SENTINEL LYMPH NODE BIOPSY;  Surgeon: Ovidio Kin, MD;  Location: [facility];  Service: General;  Laterality: Right;   OPEN REDUCTION INTERNAL FIXATION (ORIF) TIBIA/FIBULA FRACTURE Left 12/27/2022   Procedure: OPEN REDUCTION INTERNAL FIXATION (ORIF) TIBIA/FIBULA FRACTURE, OPEN TREATMENT OF SYNDESMOSIS;  Surgeon: Terance Hart, MD;  Location: [facility] OR;  Service: Orthopedics;  Laterality: Left;   REDUCTION MAMMAPLASTY Left 2017   REMOVAL OF BILATERAL TISSUE EXPANDERS WITH PLACEMENT OF BILATERAL BREAST IMPLANTS Right 08/03/2015   Procedure: REMOVAL OF RIGHT TISSUE EXPANDERS WITH PLACEMENT OF IMPLANT;  Surgeon: Glenna Fellows, MD;  Location: [facility];  Service: Plastics;  Laterality: Right;   TUBAL LIGATION      Social History  reports that she has never smoked. She has never used smokeless tobacco. She reports current alcohol use of about 4.0 - 5.0 standard drinks of alcohol per week. She reports that she does not use drugs.  No Known Allergies  Family History  Problem Relation Age of  Onset   Diabetes Mother    Hypertension Mother    Spinal muscular atrophy Mother    Irregular heart beat Mother    Anxiety disorder Mother    Arthritis Mother    Cancer Other        maternal great grandfather dx. unspecified type cancer   Heart attack Maternal Grandmother    Heart attack Maternal Uncle    Cancer Cousin        paternal 1st cousin dx. with cancer that was typically a childhood cancer   Breast cancer Neg Hx   Reviewed on admission  Prior to Admission medications   Medication Sig Start Date End Date Taking? Authorizing Provider  apixaban (ELIQUIS) 5 MG TABS tablet Take 1 tablet (5 mg total) by mouth 2 (two) times daily. 02/03/23   Christell Constant, MD  Biotin 5000 MCG TABS Take 5,000 mcg by mouth daily.    [provider]  cyclobenzaprine (FEXMID) 7.5 MG tablet Take 1 tablet (7.5 mg total) by mouth 3 (three) times daily as needed for muscle spasms. 12/27/22   [place], Jesse J, PA-C  diclofenac Sodium (VOLTAREN) 1 % GEL Apply 2-4 g topically  4 (four) times daily as needed (pain.).    [provider]  metoprolol succinate (TOPROL XL) 25 MG 24 hr tablet Take 1 tablet (25 mg total) by mouth at bedtime. 02/03/23   Riley Lam A, MD  naproxen sodium (ALEVE) 220 MG tablet Take 440-660 mg by mouth 2 (two) times daily as needed (pain.).    [provider]  OVER THE COUNTER MEDICATION Take 1 tablet by mouth daily. Neuriva    [provider]  oxyCODONE (ROXICODONE) 5 MG immediate release tablet Take 1 tablet (5 mg total) by mouth every 4 (four) hours as needed. 12/27/22   Terance Hart, MD  oxyCODONE (ROXICODONE) 5 MG immediate release tablet Take 1 tablet (5 mg total) by mouth every 4 (four) hours as needed. 12/27/22   [place], Jesse J, PA-C  triamterene-hydrochlorothiazide (DYAZIDE) 37.5-25 MG capsule TAKE 1 CAPSULE BY MOUTH EVERY DAY 08/29/22   [name], NP  valACYclovir (VALTREX) 500 MG tablet TAKE 1 TABLET (500  MG TOTAL) BY MOUTH DAILY. 07/25/22   [name], NP  venlafaxine XR (EFFEXOR-XR) 37.5 MG 24 hr capsule TAKE 1 CAPSULE BY MOUTH DAILY WITH BREAKFAST. 08/18/22   [name], NP  Vitamin D, Ergocalciferol, (DRISDOL) 1.25 MG (50000 UNIT) CAPS capsule TAKE 1 CAPSULE BY MOUTH ONE TIME PER WEEK Patient taking differently: Take 50,000 Units by mouth every Monday. 06/27/22   [name], NP    Physical Exam: Vitals:   02/04/23 1345 02/04/23 1400 02/04/23 1416 02/04/23 1445  BP: (!) 143/85 (!) 150/82 (!) 144/86 (!) 144/86  Pulse: 91 93 95 93  Resp: (!) 23 18  17   Temp:      TempSrc:      SpO2: 100% 100%  100%  Weight:      Height:        Physical Exam Constitutional:      General: She is not in acute distress.    Appearance: Normal appearance.     Comments: Anxious appearing  HENT:     Head: Normocephalic and atraumatic.     Mouth/Throat:     Mouth: Mucous membranes are moist.     Pharynx: Oropharynx is clear.  Eyes:     Extraocular Movements: Extraocular movements intact.     Pupils: Pupils are equal, round, and reactive to light.  Cardiovascular:     Rate and Rhythm: Normal rate and regular rhythm.     Pulses: Normal pulses.     Heart sounds: Normal heart sounds.  Pulmonary:     Effort: Pulmonary effort is normal. No respiratory distress.     Breath sounds: Normal breath sounds.  Abdominal:     General: Bowel sounds are normal. There is no distension.     Palpations: Abdomen is soft.     Tenderness: There is no abdominal tenderness.  Musculoskeletal:        General: No swelling or deformity.  Skin:    General: Skin is warm and dry.  Neurological:     General: No focal deficit present.     Mental Status: Mental status is at baseline.    Labs on Admission: I have personally reviewed following labs and imaging studies  CBC: Recent Labs  Lab 02/04/23 1250  WBC 6.8  NEUTROABS 5.4  HGB 11.7*  HCT 34.6*  MCV 93.5  PLT 258     Basic Metabolic Panel: Recent Labs  Lab 02/04/23 1250  NA 135  K 3.9  CL 99  CO2 18*  GLUCOSE 138*  BUN 18  CREATININE 1.12*  CALCIUM 8.7*  MG 1.9    GFR: Estimated Creatinine Clearance: 70.6 mL/min (A) (by C-G formula based on SCr of 1.12 mg/dL (H)).  Liver Function Tests: Recent Labs  Lab 02/04/23 1250  AST 80*  ALT 52*  ALKPHOS 141*  BILITOT 1.4*  PROT 7.0  ALBUMIN 3.9    Urine analysis:    Component Value Date/Time   BILIRUBINUR neg 05/03/2012 0954   PROTEINUR neg 05/03/2012 0954   UROBILINOGEN 0.2 05/03/2012 0954   NITRITE positive 05/03/2012 0954   LEUKOCYTESUR Trace 05/03/2012 0954    Radiological Exams on Admission: DG Chest Portable 1 View Result Date: 02/04/2023 CLINICAL DATA:  Shortness of breath. EXAM: PORTABLE CHEST 1 VIEW COMPARISON:  December 08, 2022. FINDINGS: Stable cardiomediastinal silhouette. Both lungs are clear. The visualized skeletal structures are unremarkable. IMPRESSION: No active disease. Electronically Signed   By: Lupita Raider M.D.   On: 02/04/2023 13:14   EKG: Independently reviewed.  Most recent EKG showed sinus rhythm at 90 bpm.  Initial EKG showed atrial fibrillation with RVR in 175 bpm.  Assessment/Plan Principal Problem:   Atrial fibrillation with RVR (HCC) Active Problems:   Malignant neoplasm of upper-inner quadrant of right breast in female, estrogen receptor positive (HCC)   Major depressive disorder, recurrent episode, mild (HCC)   GAD (generalized anxiety disorder)   Paroxysmal atrial fibrillation (HCC)   Essential hypertension  Atrial fibrillation with RVR > Tachycardia/palpitations onset this morning.  EMS noted heart rate 1 80-2 100s.  Gave adenosine with improvement rates and then diltiazem. > Arrived to the ED with rate in the 170s which improved with amiodarone and then converted to sinus rhythm. > Case discussed with cardiology provider on-call (EP provider is on-call this weekend) by EDP who  recommended discontinuation of amiodarone and outpatient follow-up considering patient had converted. > Concern for possible degree of withdrawal contributing to RVR.  Also has been sleeping less and had an energy drink.  Recent echo on 01/02/2023 showed EF 60-65%, G1 DD, normal RV function. - Monitor on telemetry overnight - Discontinue amiodarone infusion - Resume home metoprolol - Continue home Eliquis - Supportive care  Alcohol use ?Alcohol withdrawal  > History of alcohol use, at least 1/2-1 whole bottle of wine every other day to every day.  As per HPI EDP reported some family was concerned she may be drinking more.  She reports her last drink was several days ago. - CIWA without Ativan for now - Thiamine, folate, multivitamin  Hypertension - Continue home triamterene-hydrochlorothiazide - Continue metoprolol  Depression Anxiety - Continue venlafaxine  History of breast cancer > Status post mastectomy - On surveillance  DVT prophylaxis: Eliquis Code Status:   Full Family Communication:  Updated at bedside Disposition Plan:   Patient is from:  Home  Anticipated DC to:  Home  Anticipated DC date:  1 to 2 days  Anticipated DC barriers: None  Consults called:  None (EDP briefly discussed with cardiology while patient was in the ED) Admission status:  Observation, telemetry  Severity of Illness: The appropriate patient status for this patient is OBSERVATION. Observation status is judged to be reasonable and necessary in order to provide the required intensity of service to ensure the patient's safety. The patient's presenting symptoms, physical exam findings, and initial radiographic and laboratory data in the context of their medical condition is felt to place them at decreased risk for further clinical deterioration. Furthermore, it is anticipated that  the patient will be medically stable for discharge from the hospital within 2 midnights of admission.    Synetta Fail MD [facility]  How to contact the [facility] Attending or Consulting provider 7A - 7P or covering provider during after hours 7P -7A, for this patient?   Check the care team in [facility] and look for a) attending/consulting [facility] provider listed and b) the [facility] team listed Log into [email or web address] and use 's universal password to access. If you do not have the password, please contact the hospital operator. Locate the [facility] provider you are looking for under [facility] and page to a number that you can be directly reached. If you still have difficulty reaching the provider, please page the [facility] (Director on Call) for the Hospitalists listed on amion for assistance.  02/04/2023, 5:10 PM

## 2023-02-05 ENCOUNTER — Other Ambulatory Visit: Payer: Self-pay

## 2023-02-05 ENCOUNTER — Other Ambulatory Visit: Payer: Self-pay | Admitting: Adult Health

## 2023-02-05 DIAGNOSIS — I480 Paroxysmal atrial fibrillation: Secondary | ICD-10-CM | POA: Diagnosis not present

## 2023-02-05 DIAGNOSIS — I4891 Unspecified atrial fibrillation: Secondary | ICD-10-CM | POA: Diagnosis not present

## 2023-02-05 DIAGNOSIS — F411 Generalized anxiety disorder: Secondary | ICD-10-CM | POA: Diagnosis not present

## 2023-02-05 DIAGNOSIS — I10 Essential (primary) hypertension: Secondary | ICD-10-CM | POA: Diagnosis not present

## 2023-02-05 LAB — COMPREHENSIVE METABOLIC PANEL
ALT: 30 U/L (ref 0–44)
ALT: 36 U/L (ref 0–44)
AST: 43 U/L — ABNORMAL HIGH (ref 15–41)
AST: 52 U/L — ABNORMAL HIGH (ref 15–41)
Albumin: 2.6 g/dL — ABNORMAL LOW (ref 3.5–5.0)
Albumin: 3.3 g/dL — ABNORMAL LOW (ref 3.5–5.0)
Alkaline Phosphatase: 92 U/L (ref 38–126)
Alkaline Phosphatase: 116 U/L (ref 38–126)
Anion gap: 10 (ref 5–15)
Anion gap: 12 (ref 5–15)
BUN: 12 mg/dL (ref 6–20)
BUN: 13 mg/dL (ref 6–20)
CO2: 19 mmol/L — ABNORMAL LOW (ref 22–32)
CO2: 23 mmol/L (ref 22–32)
Calcium: 7.3 mg/dL — ABNORMAL LOW (ref 8.9–10.3)
Calcium: 8.7 mg/dL — ABNORMAL LOW (ref 8.9–10.3)
Chloride: 107 mmol/L (ref 98–111)
Chloride: 100 mmol/L (ref 98–111)
Creatinine, Ser: 0.67 mg/dL (ref 0.44–1.00)
Creatinine, Ser: 1.06 mg/dL — ABNORMAL HIGH (ref 0.44–1.00)
GFR, Estimated: >60 mL/min (ref >60)
GFR, Estimated: >60 mL/min (ref >60)
Glucose, Bld: 96 mg/dL (ref 70–99)
Glucose, Bld: 127 mg/dL — ABNORMAL HIGH (ref 70–99)
Potassium: 2.8 mmol/L — ABNORMAL LOW (ref 3.5–5.1)
Potassium: 3.4 mmol/L — ABNORMAL LOW (ref 3.5–5.1)
Sodium: 136 mmol/L (ref 135–145)
Sodium: 135 mmol/L (ref 135–145)
Total Bilirubin: 1.3 mg/dL — ABNORMAL HIGH (ref 0.0–1.2)
Total Bilirubin: 1.4 mg/dL — ABNORMAL HIGH (ref 0.0–1.2)
Total Protein: 4.9 g/dL — ABNORMAL LOW (ref 6.5–8.1)
Total Protein: 5.9 g/dL — ABNORMAL LOW (ref 6.5–8.1)

## 2023-02-05 LAB — CBC
HCT: 29.9 % — ABNORMAL LOW (ref 36.0–46.0)
Hemoglobin: 10.2 g/dL — ABNORMAL LOW (ref 12.0–15.0)
MCH: 32.9 pg (ref 26.0–34.0)
MCHC: 34.1 g/dL (ref 30.0–36.0)
MCV: 96.5 fL (ref 80.0–100.0)
Platelets: 216 10*3/uL (ref 150–400)
RBC: 3.1 MIL/uL — ABNORMAL LOW (ref 3.87–5.11)
RDW: 15.9 % — ABNORMAL HIGH (ref 11.5–15.5)
WBC: 4 10*3/uL (ref 4.0–10.5)
nRBC: 0 % (ref 0.0–0.2)

## 2023-02-05 LAB — MAGNESIUM: Magnesium: 2 mg/dL (ref 1.7–2.4)

## 2023-02-05 MED ORDER — HYDROXYZINE HCL 10 MG PO TABS
20.0000 mg | ORAL_TABLET | Freq: Three times a day (TID) | ORAL | 0 refills | Status: DC | PRN
Start: 2023-02-05 — End: 2023-03-13

## 2023-02-05 MED ORDER — POTASSIUM CHLORIDE CRYS ER 20 MEQ PO TBCR
40.0000 meq | EXTENDED_RELEASE_TABLET | ORAL | Status: AC
  Administered 2023-02-05 (×2): 40 meq via ORAL
  Filled 2023-02-05 (×2): qty 2

## 2023-02-05 MED ORDER — VITAMIN B-1 100 MG PO TABS: 100.0000 mg | ORAL_TABLET | Freq: Every day | ORAL | Status: DC

## 2023-02-05 MED ORDER — ADULT MULTIVITAMIN W/MINERALS CH: 1.0000 | ORAL_TABLET | Freq: Every day | ORAL | Status: AC

## 2023-02-05 NOTE — Discharge Summary (Incomplete)
Physician Discharge Summary   Patient: Brooke [name] MRN: [id number] DOB: 1965/10/14  Admit date:     02/04/2023  Discharge date: 02/05/23  Discharge Physician: Kathlen Mody   PCP: No primary care provider on file.   Recommendations at discharge:  Please follow up with PCP in one week.  Please follow up with cardiology clinic as recommended.   Discharge Diagnoses: Principal Problem:   Atrial fibrillation with RVR (HCC) Active Problems:   Malignant neoplasm of upper-inner quadrant of right breast in female, estrogen receptor positive (HCC)   Major depressive disorder, recurrent episode, mild (HCC)   GAD (generalized anxiety disorder)   Paroxysmal atrial fibrillation (HCC)   Essential hypertension  Resolved Problems:   * No resolved hospital problems. [facility] Course: No notes on file  Assessment and Plan: No notes have been filed under this hospital service. Service: Hospitalist     {Tip this will not be part of the note when signed Body mass index is 30.13 kg/m. , ,  (Optional):26781}  {(NOTE) Pain control PDMP Statment (Optional):26782} Consultants: *** Procedures performed: ***  Disposition: {Plan; Disposition:26390} Diet recommendation:  Discharge Diet Orders (From admission, onward)     Start     Ordered   02/05/23 0000  Diet - low sodium heart healthy        02/05/23 1123           {Diet_Plan:26776} DISCHARGE MEDICATION: Allergies as of 02/05/2023   No Known Allergies      Medication List     STOP taking these medications    cyclobenzaprine 7.5 MG tablet Commonly known as: Fexmid   naproxen sodium 220 MG tablet Commonly known as: ALEVE   oxyCODONE 5 MG immediate release tablet Commonly known as: Roxicodone   valACYclovir 500 MG tablet Commonly known as: VALTREX       TAKE these medications    apixaban 5 MG Tabs tablet Commonly known as: ELIQUIS Take 1 tablet (5 mg total) by mouth 2 (two) times daily.   Biotin 5000 MCG  Tabs Take 5,000 mcg by mouth daily.   cyanocobalamin 1000 MCG/ML injection Commonly known as: VITAMIN B12 Inject 1,000 mcg into the muscle every 30 (thirty) days.   hydrOXYzine 10 MG tablet Commonly known as: ATARAX Take 2 tablets (20 mg total) by mouth 3 (three) times daily as needed for anxiety.   metoprolol succinate 25 MG 24 hr tablet Commonly known as: Toprol XL Take 1 tablet (25 mg total) by mouth at bedtime.   multivitamin with minerals Tabs tablet Take 1 tablet by mouth daily. Start taking on: February 06, 2023   OVER THE COUNTER MEDICATION Take 1 tablet by mouth daily. Neuriva   thiamine 100 MG tablet Commonly known as: Vitamin B-1 Take 1 tablet (100 mg total) by mouth daily. Start taking on: February 06, 2023   triamterene-hydrochlorothiazide 37.5-25 MG capsule Commonly known as: DYAZIDE TAKE 1 CAPSULE BY MOUTH EVERY DAY   venlafaxine XR 37.5 MG 24 hr capsule Commonly known as: EFFEXOR-XR TAKE 1 CAPSULE BY MOUTH DAILY WITH BREAKFAST.   Vitamin D (Ergocalciferol) 1.25 MG (50000 UNIT) Caps capsule Commonly known as: DRISDOL TAKE 1 CAPSULE BY MOUTH ONE TIME PER WEEK What changed:  how much to take how to take this when to take this additional instructions   Voltaren 1 % Gel Generic drug: diclofenac Sodium Apply 2-4 g topically 4 (four) times daily as needed (pain.).        Discharge Exam: [employer]  02/04/23 1238  Weight: 98 kg   ***  Condition at discharge: {DC Condition:26389}  The results of significant diagnostics from this hospitalization (including imaging, microbiology, ancillary and laboratory) are listed below for reference.   Imaging Studies: DG Chest Portable 1 View Result Date: 02/04/2023 CLINICAL DATA:  Shortness of breath. EXAM: PORTABLE CHEST 1 VIEW COMPARISON:  December 08, 2022. FINDINGS: Stable cardiomediastinal silhouette. Both lungs are clear. The visualized skeletal structures are unremarkable. IMPRESSION: No active  disease. Electronically Signed   By: Lupita Raider M.D.   On: 02/04/2023 13:14    Microbiology: Results for orders placed or performed during the hospital encounter of 02/04/23  Resp panel by RT-PCR (RSV, Flu A&B, Covid) Anterior Nasal Swab     Status: None   Collection Time: 02/04/23  7:22 PM   Specimen: Anterior Nasal Swab  Result Value Ref Range Status   SARS Coronavirus 2 by RT PCR NEGATIVE NEGATIVE Final   Influenza A by PCR NEGATIVE NEGATIVE Final   Influenza B by PCR NEGATIVE NEGATIVE Final    Comment: (NOTE) The Xpert Xpress SARS-CoV-2/FLU/RSV plus assay is intended as an aid in the diagnosis of influenza from Nasopharyngeal swab specimens and should not be used as a sole basis for treatment. Nasal washings and aspirates are unacceptable for Xpert Xpress SARS-CoV-2/FLU/RSV testing.  Fact Sheet for Patients: BloggerCourse.com  Fact Sheet for Healthcare Providers: SeriousBroker.it  This test is not yet approved or cleared by the [place] FDA and has been authorized for detection and/or diagnosis of SARS-CoV-2 by FDA under an Emergency Use Authorization (EUA). This EUA will remain in effect (meaning this test can be used) for the duration of the COVID-19 declaration under Section 564(b)(1) of the Act, 21 U.S.C. section 360bbb-3(b)(1), unless the authorization is terminated or revoked.     Resp Syncytial Virus by PCR NEGATIVE NEGATIVE Final    Comment: (NOTE) Fact Sheet for Patients: BloggerCourse.com  Fact Sheet for Healthcare Providers: SeriousBroker.it  This test is not yet approved or cleared by the [place] FDA and has been authorized for detection and/or diagnosis of SARS-CoV-2 by FDA under an Emergency Use Authorization (EUA). This EUA will remain in effect (meaning this test can be used) for the duration of the COVID-19 declaration under Section  564(b)(1) of the Act, 21 U.S.C. section 360bbb-3(b)(1), unless the authorization is terminated or revoked.  Performed at [facility], [address] [place], [place] [address]     Labs: CBC: Recent Labs  Lab 02/04/23 1250 02/05/23 0514  WBC 6.8 4.0  NEUTROABS 5.4  --   HGB 11.7* 10.2*  HCT 34.6* 29.9*  MCV 93.5 96.5  PLT 258 216   Basic Metabolic Panel: Recent Labs  Lab 02/04/23 1250 02/05/23 0348 02/05/23 0514  NA 135 136 135  K 3.9 2.8* 3.4*  CL 99 107 100  CO2 18* 19* 23  GLUCOSE 138* 96 127*  BUN 18 12 13   CREATININE 1.12* 0.67 1.06*  CALCIUM 8.7* 7.3* 8.7*  MG 1.9  --  2.0   Liver Function Tests: Recent Labs  Lab 02/04/23 1250 02/05/23 0348 02/05/23 0514  AST 80* 43* 52*  ALT 52* 30 36  ALKPHOS 141* 92 116  BILITOT 1.4* 1.3* 1.4*  PROT 7.0 4.9* 5.9*  ALBUMIN 3.9 2.6* 3.3*   CBG: No results for input(s): "GLUCAP" in the last 168 hours.  Discharge time spent: {LESS THAN/GREATER UEAV:40981} 30 minutes.  Signed: Kathlen Mody, MD [facility] 02/05/2023

## 2023-02-05 NOTE — ED Notes (Signed)
This RN reviewed discharge instructions with patient. She verbalized understanding and denied any further questions. PT well appearing upon discharge and reports no pain.Wheeled pt out to ED lobby and she safely got into car with husband and departed.

## 2023-02-06 ENCOUNTER — Encounter: Payer: Self-pay | Admitting: Adult Health

## 2023-02-06 ENCOUNTER — Ambulatory Visit: Payer: BC Managed Care – PPO | Admitting: Nurse Practitioner

## 2023-02-08 ENCOUNTER — Telehealth: Payer: Self-pay | Admitting: Internal Medicine

## 2023-02-08 NOTE — Telephone Encounter (Signed)
 Pt c/o Shortness Of Breath: STAT if SOB developed within the last 24 hours or pt is noticeably SOB on the phone  1. Are you currently SOB (can you hear that pt is SOB on the phone)? no  2. How long have you been experiencing SOB? Since sat  3. Are you SOB when sitting or when up moving around? Moving around  4. Are you currently experiencing any other symptoms? Fatigued, dizziness

## 2023-02-09 MED ORDER — POTASSIUM CHLORIDE CRYS ER 20 MEQ PO TBCR: 40.0000 meq | EXTENDED_RELEASE_TABLET | Freq: Every day | ORAL | 11 refills | Status: AC

## 2023-02-09 NOTE — Telephone Encounter (Signed)
Please see my chart encounter for details. 

## 2023-02-09 NOTE — Telephone Encounter (Signed)
 Called pt advised of MD response.  K 40 meq PO daily; please recheck at AF clinic visit  Needs PCP referral.  Pt reports feels extremely tired and weak wants to know what can be done to fix this.  Advised pt of need for PCP f/u per MD response lab values are low.  Pt reports has an OV with PCP on 04/20/23.  New PCP is Brooke [name] [facility].

## 2023-02-10 ENCOUNTER — Other Ambulatory Visit: Payer: Self-pay | Admitting: *Deleted

## 2023-02-10 ENCOUNTER — Other Ambulatory Visit: Payer: Self-pay

## 2023-02-10 DIAGNOSIS — E538 Deficiency of other specified B group vitamins: Secondary | ICD-10-CM

## 2023-02-10 DIAGNOSIS — D649 Anemia, unspecified: Secondary | ICD-10-CM

## 2023-02-10 DIAGNOSIS — C50211 Malignant neoplasm of upper-inner quadrant of right female breast: Secondary | ICD-10-CM

## 2023-02-11 ENCOUNTER — Other Ambulatory Visit: Payer: Self-pay | Admitting: Adult Health

## 2023-02-11 DIAGNOSIS — C50211 Malignant neoplasm of upper-inner quadrant of right female breast: Secondary | ICD-10-CM

## 2023-02-13 ENCOUNTER — Inpatient Hospital Stay: Attending: Adult Health

## 2023-02-13 ENCOUNTER — Inpatient Hospital Stay

## 2023-02-13 ENCOUNTER — Encounter: Payer: Self-pay | Admitting: Adult Health

## 2023-02-13 VITALS — BP 116/80 | HR 66 | Temp 97.5°F | Resp 18

## 2023-02-13 DIAGNOSIS — E538 Deficiency of other specified B group vitamins: Secondary | ICD-10-CM

## 2023-02-13 DIAGNOSIS — D649 Anemia, unspecified: Secondary | ICD-10-CM

## 2023-02-13 DIAGNOSIS — C50211 Malignant neoplasm of upper-inner quadrant of right female breast: Secondary | ICD-10-CM

## 2023-02-13 LAB — CBC WITH DIFFERENTIAL (CANCER CENTER ONLY)
Abs Immature Granulocytes: 0.01 10*3/uL (ref 0.00–0.07)
Basophils Absolute: 0.1 10*3/uL (ref 0.0–0.1)
Basophils Relative: 1 %
Eosinophils Absolute: 0.2 10*3/uL (ref 0.0–0.5)
Eosinophils Relative: 4 %
HCT: 32.7 % — ABNORMAL LOW (ref 36.0–46.0)
Hemoglobin: 10.8 g/dL — ABNORMAL LOW (ref 12.0–15.0)
Immature Granulocytes: 0 %
Lymphocytes Relative: 36 %
Lymphs Abs: 1.9 10*3/uL (ref 0.7–4.0)
MCH: 32.6 pg (ref 26.0–34.0)
MCHC: 33 g/dL (ref 30.0–36.0)
MCV: 98.8 fL (ref 80.0–100.0)
Monocytes Absolute: 0.5 10*3/uL (ref 0.1–1.0)
Monocytes Relative: 10 %
Neutro Abs: 2.5 10*3/uL (ref 1.7–7.7)
Neutrophils Relative %: 49 %
Platelet Count: 385 10*3/uL (ref 150–400)
RBC: 3.31 MIL/uL — ABNORMAL LOW (ref 3.87–5.11)
RDW: 15.7 % — ABNORMAL HIGH (ref 11.5–15.5)
WBC Count: 5.2 10*3/uL (ref 4.0–10.5)
nRBC: 0 % (ref 0.0–0.2)

## 2023-02-13 LAB — CMP (CANCER CENTER ONLY)
ALT: 18 U/L (ref 0–44)
AST: 19 U/L (ref 15–41)
Albumin: 4.2 g/dL (ref 3.5–5.0)
Alkaline Phosphatase: 95 U/L (ref 38–126)
Anion gap: 7 (ref 5–15)
BUN: 19 mg/dL (ref 6–20)
CO2: 25 mmol/L (ref 22–32)
Calcium: 9.4 mg/dL (ref 8.9–10.3)
Chloride: 106 mmol/L (ref 98–111)
Creatinine: 0.89 mg/dL (ref 0.44–1.00)
GFR, Estimated: >60 mL/min (ref >60)
Glucose, Bld: 116 mg/dL — ABNORMAL HIGH (ref 70–99)
Potassium: 4.1 mmol/L (ref 3.5–5.1)
Sodium: 138 mmol/L (ref 135–145)
Total Bilirubin: 0.5 mg/dL (ref 0.0–1.2)
Total Protein: 7 g/dL (ref 6.5–8.1)

## 2023-02-13 LAB — VITAMIN D 25 HYDROXY (VIT D DEFICIENCY, FRACTURES): Vit D, 25-Hydroxy: 54.87 ng/mL (ref 30–100)

## 2023-02-13 MED ORDER — CYANOCOBALAMIN 1000 MCG/ML IJ SOLN
1000.0000 ug | Freq: Once | INTRAMUSCULAR | Status: AC
  Administered 2023-02-13: 1000 ug via INTRAMUSCULAR
  Filled 2023-02-13: qty 1

## 2023-02-16 ENCOUNTER — Other Ambulatory Visit: Payer: Self-pay | Admitting: Adult Health

## 2023-02-17 ENCOUNTER — Other Ambulatory Visit: Payer: Self-pay

## 2023-02-17 ENCOUNTER — Telehealth: Payer: Self-pay

## 2023-02-17 NOTE — Telephone Encounter (Signed)
Please tell patient she doesn't need ultra dosing anymore and can isntead do vitamin d3 2000 units daily Np message below. Pt verbalized understanding. Please tell patient she doesn't need ultra dosing anymore and can isntead do vitamin d3 2000 units daily

## 2023-02-17 NOTE — Telephone Encounter (Signed)
Open in error

## 2023-02-20 ENCOUNTER — Ambulatory Visit (HOSPITAL_COMMUNITY)
Admission: RE | Admit: 2023-02-20 | Discharge: 2023-02-20 | Disposition: A | Discharge status: Home / Self Care | Source: Ambulatory Visit | Attending: Internal Medicine | Admitting: Internal Medicine

## 2023-02-20 VITALS — BP 112/68 | HR 81 | Ht 71.0 in | Wt 215.0 lb

## 2023-02-20 DIAGNOSIS — R0683 Snoring: Secondary | ICD-10-CM | POA: Insufficient documentation

## 2023-02-20 DIAGNOSIS — Z853 Personal history of malignant neoplasm of breast: Secondary | ICD-10-CM | POA: Diagnosis not present

## 2023-02-20 DIAGNOSIS — Z9884 Bariatric surgery status: Secondary | ICD-10-CM | POA: Insufficient documentation

## 2023-02-20 DIAGNOSIS — I480 Paroxysmal atrial fibrillation: Secondary | ICD-10-CM

## 2023-02-20 DIAGNOSIS — Z8249 Family history of ischemic heart disease and other diseases of the circulatory system: Secondary | ICD-10-CM | POA: Diagnosis not present

## 2023-02-20 DIAGNOSIS — Z79899 Other long term (current) drug therapy: Secondary | ICD-10-CM | POA: Insufficient documentation

## 2023-02-20 DIAGNOSIS — Z7901 Long term (current) use of anticoagulants: Secondary | ICD-10-CM | POA: Insufficient documentation

## 2023-02-20 LAB — BASIC METABOLIC PANEL
Anion gap: 9 (ref 5–15)
BUN: 20 mg/dL (ref 6–20)
CO2: 23 mmol/L (ref 22–32)
Calcium: 8.6 mg/dL — ABNORMAL LOW (ref 8.9–10.3)
Chloride: 107 mmol/L (ref 98–111)
Creatinine, Ser: 0.89 mg/dL (ref 0.44–1.00)
GFR, Estimated: >60 mL/min (ref >60)
Glucose, Bld: 80 mg/dL (ref 70–99)
Potassium: 4 mmol/L (ref 3.5–5.1)
Sodium: 139 mmol/L (ref 135–145)

## 2023-02-20 NOTE — Progress Notes (Addendum)
Primary Care Physician: [name], NP Primary Cardiologist: None Electrophysiologist: None     Referring Physician: ED    Brooke [name] is a 58 y.o. female with a history of gastric bypass surgery, HTN?, breast cancer, and paroxysmal atrial fibrillation who presents for consultation in the [facility]. Seen by Cardiology for near syncope and family history of cardiac amyloidosis. A cardiac monitor was placed and Cardiology alerted on 12/5 for patient being in new onset Afib with RVR. Seen by ED on 12/5 and was back in NSR; placed on anticoagulation. Patient is on Eliquis 5 mg BID for a CHADS2VASC score of 2.  On evaluation today, she is currently in NSR. She is still wearing monitor until 12/17. She notes feeling tired but has felt this way prior to monitor placement. She wears a series 2 Apple watch.   On follow up 02/20/23, she is currently in NSR. She was recently admitted 2/1-2/25 for Afib with RVR. Patient noted to intermittently miss doses of Eliquis. History of alcohol use 1/2-1 bottle of wine every other day to every day; patient had admitted to also having an energy drink. Patient spontaneously converted to NSR and was discontinued off amiodarone gtt per Dr. Elberta Fortis due to being in NSR. Patient discharged on Toprol 25 mg daily and Eliquis 5 mg BID. Per phone notes, patient was given K repletion and to recheck Bmet. She snores. She was drinking Red Bull drinks frequently. She has lowered her alcohol intake significantly since hospital discharge.   Today, she denies symptoms of palpitations, chest pain, shortness of breath, orthopnea, PND, lower extremity edema, dizziness, presyncope, syncope, snoring, daytime somnolence, bleeding, or neurologic sequela. The patient is tolerating medications without difficulties and is otherwise without complaint today.    she has a BMI of Body mass index is 29.99 kg/m.Marland Kitchen Filed Weights   02/20/23 1530  Weight:  97.5 kg     Current Outpatient Medications  Medication Sig Dispense Refill   apixaban (ELIQUIS) 5 MG TABS tablet Take 1 tablet (5 mg total) by mouth 2 (two) times daily. 180 tablet 3   Biotin 5000 MCG TABS Take 5,000 mcg by mouth daily.     cyanocobalamin (VITAMIN B12) 1000 MCG/ML injection Inject 1,000 mcg into the muscle every 30 (thirty) days.     diclofenac Sodium (VOLTAREN) 1 % GEL Apply 2-4 g topically 4 (four) times daily as needed (pain.).     Multiple Vitamin (MULTIVITAMIN WITH MINERALS) TABS tablet Take 1 tablet by mouth daily.     OVER THE COUNTER MEDICATION Take 1 tablet by mouth daily. Neuriva     potassium chloride SA (KLOR-CON M) 20 MEQ tablet Take 2 tablets (40 mEq total) by mouth daily. 60 tablet 11   triamterene-hydrochlorothiazide (DYAZIDE) 37.5-25 MG capsule TAKE 1 CAPSULE BY MOUTH EVERY DAY 90 capsule 1   venlafaxine XR (EFFEXOR-XR) 37.5 MG 24 hr capsule TAKE 1 CAPSULE BY MOUTH DAILY WITH BREAKFAST. 90 capsule 1   hydrOXYzine (ATARAX) 10 MG tablet Take 2 tablets (20 mg total) by mouth 3 (three) times daily as needed for anxiety. (Patient not taking: Reported on 02/20/2023) 30 tablet 0   metoprolol succinate (TOPROL XL) 25 MG 24 hr tablet Take 1 tablet (25 mg total) by mouth at bedtime. (Patient not taking: Reported on 02/20/2023) 90 tablet 3   No current facility-administered medications for this encounter.    Atrial Fibrillation Management history:  Previous antiarrhythmic drugs: none Previous cardioversions: none Previous ablations: none  Anticoagulation history: Eliquis   ROS- All systems are reviewed and negative except as per the HPI above.  Physical Exam: BP 112/68   Pulse 81   Ht 5\' 11"  (1.803 m)   Wt 97.5 kg   LMP 04/05/2016 (Approximate)   BMI 29.99 kg/m   GEN- The patient is well appearing, alert and oriented x 3 today.   Neck - no JVD or carotid bruit noted Lungs- Clear to ausculation bilaterally, normal work of breathing Heart- Regular rate and  rhythm, no murmurs, rubs or gallops, PMI not laterally displaced Extremities- no clubbing, cyanosis, or edema Skin - no rash or ecchymosis noted   EKG today demonstrates  Vent. rate 81 BPM PR interval 158 ms QRS duration 80 ms QT/QTcB 392/455 ms P-R-T axes 77 -13 -46 Normal sinus rhythm Moderate voltage criteria for LVH, may be normal variant ( R in aVL , Cornell product ) Nonspecific ST and T wave abnormality Abnormal ECG When compared with ECG of 05-Feb-2023 13:18, PREVIOUS ECG IS PRESENT  Echocardiogram 01/02/23:  1. Left ventricular ejection fraction, by estimation, is 60 to 65%. The  left ventricle has normal function. The left ventricle has no regional  wall motion abnormalities. Left ventricular diastolic parameters are  consistent with Grade I diastolic  dysfunction (impaired relaxation).   2. Right ventricular systolic function is normal. The right ventricular  size is normal.   3. Left atrial size was mildly dilated.   4. The mitral valve is normal in structure. Trivial mitral valve  regurgitation. No evidence of mitral stenosis.   5. The aortic valve is tricuspid. Aortic valve regurgitation is not  visualized. No aortic stenosis is present.   6. The inferior vena cava is normal in size with greater than 50%  respiratory variability, suggesting right atrial pressure of 3 mmHg.    ASSESSMENT & PLAN CHA2DS2-VASc Score = 2  The patient's score is based upon: CHF History: 0 HTN History: 1 Diabetes History: 0 Stroke History: 0 Vascular Disease History: 0 Age Score: 0 Gender Score: 1      ASSESSMENT AND PLAN: Paroxysmal Atrial Fibrillation (ICD10:  I48.0) The patient's CHA2DS2-VASc score is 2, indicating a 2.2% annual risk of stroke.    She is in NSR. Will continue conservative observation. We discussed the importance of alcohol cessation as it can be a trigger for Afib and overall to help prevent liver damage. She has lowered her quantity significantly per her  report which is great news. She snores and due to this will order sleep study per patient. We discussed reduction of triggers, alcohol and caffeine. Continue rhythm monitoring at home with Apple watch. Bmet to recheck potassium drawn. Continue Toprol 25 mg daily.  Continue Eliquis 5 mg BID.      Follow up Afib clinic 1 year.    Lake Bells, PA-C  Afib Clinic [facility] [address] [place], [place] [address] [phone]

## 2023-02-21 ENCOUNTER — Encounter (HOSPITAL_COMMUNITY): Payer: Self-pay

## 2023-02-22 NOTE — Progress Notes (Unsigned)
  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 female with a PMH of***   During today's visit the patient reports*** .  Patient denies chest pain, palpitations, dyspnea, PND, orthopnea, nausea, vomiting, dizziness, syncope, edema, weight gain, or early satiety.  ***Notes: -Last ischemic evaluation: -Last echo: -Interim ED visits: Review of Systems  Please see the history of present illness.    All other systems reviewed and are otherwise negative except as noted above.  Physical Exam    Wt Readings from Last 3 Encounters:  02/20/23 215 lb (97.5 kg)  02/04/23 216 lb (98 kg)  02/03/23 217 lb (98.4 kg)   YN:WGNFA were no vitals filed for this visit.,There is no height or weight on file to calculate BMI. GEN: Well nourished, well developed in no acute distress Neck: No JVD; No carotid bruits Pulmonary: Clear to auscultation without rales, wheezing or rhonchi  Cardiovascular: Normal rate. Regular rhythm. Normal S1. Normal S2.   Murmurs: There is no murmur.  ABDOMEN: Soft, non-tender, non-distended EXTREMITIES:  No edema; No deformity   EKG/LABS/ Recent Cardiac Studies   ECG personally reviewed by me today - ***  Risk Assessment/Calculations:   {Does this patient have ATRIAL FIBRILLATION?:[phone]}      Lab Results  Component Value Date   WBC 5.2  02/13/2023   HGB 10.8 (L) 02/13/2023   HCT 32.7 (L) 02/13/2023   MCV 98.8 02/13/2023   PLT 385 02/13/2023   Lab Results  Component Value Date   CREATININE 0.89 02/20/2023   BUN 20 02/20/2023   NA 139 02/20/2023   K 4.0 02/20/2023   CL 107 02/20/2023   CO2 23 02/20/2023   No results found for: "CHOL", "HDL", "LDLCALC", "LDLDIRECT", "TRIG", "CHOLHDL"  Lab Results  Component Value Date   HGBA1C 6.2 (H) 02/02/2012   Assessment & Plan    1.***  2.***  3.***  4.***      Disposition: Follow-up with None or APP in *** months {Are you ordering a CV Procedure (e.g. stress test, cath, DCCV, TEE, etc)?   Press F2        :[id number]}   Signed, Napoleon Form, Leodis Rains, NP 02/22/2023, 5:01 PM [facility]

## 2023-02-24 ENCOUNTER — Ambulatory Visit: Attending: Nurse Practitioner | Admitting: Nurse Practitioner

## 2023-02-24 ENCOUNTER — Encounter: Payer: Self-pay | Admitting: Nurse Practitioner

## 2023-02-24 VITALS — BP 110/80 | HR 73 | Ht 71.0 in | Wt 213.0 lb

## 2023-02-24 DIAGNOSIS — F1010 Alcohol abuse, uncomplicated: Secondary | ICD-10-CM

## 2023-02-24 DIAGNOSIS — I10 Essential (primary) hypertension: Secondary | ICD-10-CM

## 2023-02-24 DIAGNOSIS — I480 Paroxysmal atrial fibrillation: Secondary | ICD-10-CM | POA: Diagnosis not present

## 2023-02-24 DIAGNOSIS — Z170 Estrogen receptor positive status [ER+]: Secondary | ICD-10-CM

## 2023-02-24 DIAGNOSIS — C50211 Malignant neoplasm of upper-inner quadrant of right female breast: Secondary | ICD-10-CM | POA: Diagnosis not present

## 2023-02-24 MED ORDER — METOPROLOL SUCCINATE ER 25 MG PO TB24
25.0000 mg | ORAL_TABLET | Freq: Every day | ORAL | 3 refills | Status: AC
Start: 2023-02-24 — End: ?

## 2023-02-24 NOTE — Patient Instructions (Signed)
Medication Instructions:  Refilled Metoprolol *If you need a refill on your cardiac medications before your next appointment, please call your pharmacy*   Follow-Up: At [facility], you and your health needs are our priority.  As part of our continuing mission to provide you with exceptional heart care, we have created designated Provider Care Teams.  These Care Teams include your primary Cardiologist (physician) and Advanced Practice Providers (APPs -  Physician Assistants and Nurse Practitioners) who all work together to provide you with the care you need, when you need it.  We recommend signing up for the patient portal called "MyChart".  Sign up information is provided on this After Visit Summary.  MyChart is used to connect with patients for Virtual Visits (Telemedicine).  Patients are able to view lab/test results, encounter notes, upcoming appointments, etc.  Non-urgent messages can be sent to your provider as well.   To learn more about what you can do with MyChart, go to [email or web address].    Your next appointment:   6 month(s)  Provider:   Izora Ribas, MD     Other Instructions   1st Floor: - Lobby - Registration  - Pharmacy  - Lab - Cafe  2nd Floor: - PV Lab - Diagnostic Testing (echo, CT, nuclear med)  3rd Floor: - Vacant  4th Floor: - TCTS (cardiothoracic surgery) - AFib Clinic - Structural Heart Clinic - Vascular Surgery  - Vascular Ultrasound  5th Floor: - HeartCare Cardiology (general and EP) - Clinical Pharmacy for coumadin, hypertension, lipid, weight-loss medications, and med management appointments    Valet parking services will be available as well.

## 2023-02-26 ENCOUNTER — Other Ambulatory Visit: Payer: Self-pay | Admitting: Adult Health

## 2023-02-26 DIAGNOSIS — I10 Essential (primary) hypertension: Secondary | ICD-10-CM

## 2023-02-27 ENCOUNTER — Other Ambulatory Visit: Payer: Self-pay | Admitting: Adult Health

## 2023-02-27 DIAGNOSIS — F411 Generalized anxiety disorder: Secondary | ICD-10-CM

## 2023-03-10 ENCOUNTER — Other Ambulatory Visit: Payer: Self-pay | Admitting: *Deleted

## 2023-03-10 DIAGNOSIS — C50211 Malignant neoplasm of upper-inner quadrant of right female breast: Secondary | ICD-10-CM

## 2023-03-13 ENCOUNTER — Other Ambulatory Visit: Payer: Self-pay | Admitting: *Deleted

## 2023-03-13 ENCOUNTER — Inpatient Hospital Stay (HOSPITAL_BASED_OUTPATIENT_CLINIC_OR_DEPARTMENT_OTHER): Payer: BC Managed Care – PPO | Admitting: Adult Health

## 2023-03-13 ENCOUNTER — Inpatient Hospital Stay: Payer: BC Managed Care – PPO

## 2023-03-13 ENCOUNTER — Inpatient Hospital Stay: Payer: BC Managed Care – PPO | Attending: Adult Health

## 2023-03-13 ENCOUNTER — Encounter: Payer: Self-pay | Admitting: Adult Health

## 2023-03-13 ENCOUNTER — Telehealth: Payer: Self-pay | Admitting: *Deleted

## 2023-03-13 VITALS — BP 142/81 | HR 72 | Temp 98.3°F | Resp 18 | Ht 71.0 in | Wt 209.8 lb

## 2023-03-13 DIAGNOSIS — E538 Deficiency of other specified B group vitamins: Secondary | ICD-10-CM

## 2023-03-13 DIAGNOSIS — Z170 Estrogen receptor positive status [ER+]: Secondary | ICD-10-CM

## 2023-03-13 DIAGNOSIS — Z853 Personal history of malignant neoplasm of breast: Secondary | ICD-10-CM | POA: Insufficient documentation

## 2023-03-13 DIAGNOSIS — C50211 Malignant neoplasm of upper-inner quadrant of right female breast: Secondary | ICD-10-CM | POA: Diagnosis not present

## 2023-03-13 LAB — CMP (CANCER CENTER ONLY)
ALT: 43 U/L (ref 0–44)
AST: 31 U/L (ref 15–41)
Albumin: 4.2 g/dL (ref 3.5–5.0)
Alkaline Phosphatase: 106 U/L (ref 38–126)
Anion gap: 6 (ref 5–15)
BUN: 19 mg/dL (ref 6–20)
CO2: 25 mmol/L (ref 22–32)
Calcium: 8.9 mg/dL (ref 8.9–10.3)
Chloride: 105 mmol/L (ref 98–111)
Creatinine: 0.77 mg/dL (ref 0.44–1.00)
GFR, Estimated: >60 mL/min (ref >60)
Glucose, Bld: 110 mg/dL — ABNORMAL HIGH (ref 70–99)
Potassium: 4.2 mmol/L (ref 3.5–5.1)
Sodium: 136 mmol/L (ref 135–145)
Total Bilirubin: 0.5 mg/dL (ref 0.0–1.2)
Total Protein: 7 g/dL (ref 6.5–8.1)

## 2023-03-13 LAB — CBC WITH DIFFERENTIAL (CANCER CENTER ONLY)
Abs Immature Granulocytes: 0.02 10*3/uL (ref 0.00–0.07)
Basophils Absolute: 0.1 10*3/uL (ref 0.0–0.1)
Basophils Relative: 1 %
Eosinophils Absolute: 0.2 10*3/uL (ref 0.0–0.5)
Eosinophils Relative: 3 %
HCT: 32.4 % — ABNORMAL LOW (ref 36.0–46.0)
Hemoglobin: 10.4 g/dL — ABNORMAL LOW (ref 12.0–15.0)
Immature Granulocytes: 0 %
Lymphocytes Relative: 46 %
Lymphs Abs: 2.5 10*3/uL (ref 0.7–4.0)
MCH: 31.6 pg (ref 26.0–34.0)
MCHC: 32.1 g/dL (ref 30.0–36.0)
MCV: 98.5 fL (ref 80.0–100.0)
Monocytes Absolute: 0.6 10*3/uL (ref 0.1–1.0)
Monocytes Relative: 10 %
Neutro Abs: 2.2 10*3/uL (ref 1.7–7.7)
Neutrophils Relative %: 40 %
Platelet Count: 252 10*3/uL (ref 150–400)
RBC: 3.29 MIL/uL — ABNORMAL LOW (ref 3.87–5.11)
RDW: 16 % — ABNORMAL HIGH (ref 11.5–15.5)
WBC Count: 5.5 10*3/uL (ref 4.0–10.5)
nRBC: 0 % (ref 0.0–0.2)

## 2023-03-13 LAB — VITAMIN B12: Vitamin B-12: 485 pg/mL (ref 180–914)

## 2023-03-13 MED ORDER — CYANOCOBALAMIN 1000 MCG/ML IJ SOLN
1000.0000 ug | Freq: Once | INTRAMUSCULAR | Status: AC
  Administered 2023-03-13: 1000 ug via INTRAMUSCULAR
  Filled 2023-03-13: qty 1

## 2023-03-13 MED ORDER — HYDROXYZINE HCL 10 MG PO TABS: 20.0000 mg | ORAL_TABLET | Freq: Three times a day (TID) | ORAL | 0 refills | Status: DC | PRN

## 2023-03-13 NOTE — Progress Notes (Unsigned)
 [facility] Cancer Follow up:    [name], NP [address] [place] [address]   DIAGNOSIS: Cancer Staging  Malignant neoplasm of upper-inner quadrant of right breast in female, estrogen receptor positive (HCC) Staging form: Breast, AJCC 7th Edition - Clinical stage from 02/25/2015: Stage IIA (T2, N0, M0) - Unsigned Staged by: Pathologist and managing physician Laterality: Right Estrogen receptor status: Positive Progesterone receptor status: Positive HER2 status: Negative Stage used in treatment planning: Yes National guidelines used in treatment planning: Yes Type of national guideline used in treatment planning: NCCN - Pathologic stage from 04/06/2015: Stage IA (T1c, N0, cM0) - Unsigned Laterality: Right   SUMMARY OF ONCOLOGIC HISTORY: Oncology History  Malignant neoplasm of upper-inner quadrant of right breast in female, estrogen receptor positive (HCC)  02/12/2015 Mammogram   Right mammogram: UIQ of the right breast, anterior depth, spiculated mass with pleomorphic calcifications measuring ~1.5 cm mammographically. Approximately 5.5 cm posterior to this mass, there is a more subtle area of density with pleomorphic calcs   02/17/2015 Initial Biopsy   Right breast core needle bx: 2 lesions (separated by 10 cm): invasive ductal carcinoma, ER+, PR+, Ki67 25%, HER2/neu negative   02/17/2015 Clinical Stage   Stage II: T2 Nx   02/25/2015 -  Neo-Adjuvant Anti-estrogen oral therapy   Tamoxifen 20 mg began; held 05/2015 due to intolerable side effects   03/06/2015 Procedure   Breast/ Ovarian cancer panel (GeneDx): no deleterious mutations at ATM, BARD1, BRCA1, BRCA2, BRIP1, CDH1, CHEK2, FANCC, MLH1, MSH2, MSH6, NBN, PALB2, PMS2, PTEN, RAD51C, RAD51D, TP53, and XRCC2.  VUS called "c.662T>C (p.Ile221Thr)" found XRCC2.   03/10/2015 Procedure   Left breast bx: ADH   03/23/2015 Surgery   Left lumpectomy: ductal hyperplasia   04/06/2015 Definitive Surgery    Right mastectomy/SLNB: invasive ductal carcinoma, grade 2, negative margins, repeat HER2/neu negative   04/06/2015 Pathologic Stage   Stage IA: pT1c N0   04/06/2015 Oncotype testing   RS 5 (5% ROR)   06/22/2015 Survivorship   SCP mailed to patient at her request in lieu of in person visit     CURRENT THERAPY: observation  INTERVAL HISTORY:  Discussed the use of AI scribe software for clinical note transcription with the patient, who gave verbal consent to proceed.  Brooke [name] 58 y.o. female returns for    Patient Active Problem List   Diagnosis Date Noted  . Atrial fibrillation with RVR (HCC) 02/04/2023  . Status post open reduction and internal fixation (ORIF) of fracture 12/27/2022  . Paroxysmal atrial fibrillation (HCC) 12/15/2022  . Syncope 11/21/2022  . H/O peripheral neuropathy 11/21/2022  . Abnormal electrocardiogram (ECG) (EKG) 11/21/2022  . B12 deficiency 07/24/2021  . Essential hypertension 03/19/2021  . Contusion of right foot 01/28/2020  . Acute bronchitis 08/08/2019  . 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 GAD (generalized anxiety disorder) 06/26/2019  . Alcohol use disorder, mild, abuse 06/26/2019  . Contusion of right knee 05/01/2016  . Atypical ductal hyperplasia of left breast 04/22/2015  . 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 Genetic testing 03/09/2015  . 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 Generalized hyperhidrosis 06/23/2014  . Chronic headaches     has no known allergies.  MEDICAL HISTORY: Past Medical History:  Diagnosis Date  . Alcohol use disorder, mild, abuse 06/26/2019  . Anemia   . Anxiety   . Breast cancer (HCC)   .  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 Chronic headaches   . Depression   . Fatigue   . Hot flashes   . Hypertension   . PAF (paroxysmal  atrial fibrillation) (HCC) 12/08/2022  . Syncope 11/2022  . Yeast infection     SURGICAL HISTORY: Past Surgical History:  Procedure Laterality Date  . BREAST IMPLANT REMOVAL Right 06/24/2022   Procedure: REMOVAL BREAST IMPLANT;  Surgeon: Glenna Fellows, MD;  Location: [facility];  Service: Plastics;  Laterality: Right;  . BREAST LUMPECTOMY Left 2017  . BREAST LUMPECTOMY WITH RADIOACTIVE SEED LOCALIZATION Left 04/06/2015  . BREAST LUMPECTOMY WITH RADIOACTIVE SEED LOCALIZATION Left 04/06/2015   Procedure: LEFT BREAST LUMPECTOMY WITH RADIOACTIVE SEED LOCALIZATION;  Surgeon: Ovidio Kin, MD;  Location: [facility] OR;  Service: General;  Laterality: Left;  . BREAST RECONSTRUCTION WITH PLACEMENT OF TISSUE EXPANDER AND FLEX HD (ACELLULAR HYDRATED DERMIS) Right 04/06/2015   Procedure: RIGHT BREAST RECONSTRUCTION WITH PLACEMENT OF TISSUE EXPANDER, POSSIBLE ACELLULAR DERMIS;  Surgeon: Glenna Fellows, MD;  Location: [facility] OR;  Service: Plastics;  Laterality: Right;  . BREAST REDUCTION SURGERY Left 08/03/2015   Procedure: LEFT BREAST REDUCTION FOR SYMMETRY  (BREAST);  Surgeon: Glenna Fellows, MD;  Location: [facility];  Service: Plastics;  Laterality: Left;  . CAPSULECTOMY Right 06/24/2022   Procedure: CAPSULECTOMY;  Surgeon: Glenna Fellows, MD;  Location: [facility];  Service: Plastics;  Laterality: Right;  . CHOLECYSTECTOMY    . GASTRIC BYPASS    . LIPOSUCTION WITH LIPOFILLING N/A 08/03/2015   Procedure: LIPOSUCTION WITH LIPOFILLING ;  Surgeon: Glenna Fellows, MD;  Location: [facility];  Service: Plastics;  Laterality: N/A;  . MASTECTOMY Right 2017  . MASTECTOMY W/ SENTINEL NODE BIOPSY Right   . MASTECTOMY W/ SENTINEL NODE BIOPSY Right 04/06/2015   Procedure: RIGHT MASTECTOMY WITH SENTINEL LYMPH NODE BIOPSY;  Surgeon: Ovidio Kin, MD;  Location: [facility] OR;  Service: General;  Laterality: Right;  . OPEN REDUCTION INTERNAL FIXATION (ORIF) TIBIA/FIBULA  FRACTURE Left 12/27/2022   Procedure: OPEN REDUCTION INTERNAL FIXATION (ORIF) TIBIA/FIBULA FRACTURE, OPEN TREATMENT OF SYNDESMOSIS;  Surgeon: Terance Hart, MD;  Location: [facility] OR;  Service: Orthopedics;  Laterality: Left;  . REDUCTION MAMMAPLASTY Left 2017  . REMOVAL OF BILATERAL TISSUE EXPANDERS WITH PLACEMENT OF BILATERAL BREAST IMPLANTS Right 08/03/2015   Procedure: REMOVAL OF RIGHT TISSUE EXPANDERS WITH PLACEMENT OF IMPLANT;  Surgeon: Glenna Fellows, MD;  Location: [facility];  Service: Plastics;  Laterality: Right;  . TUBAL LIGATION      SOCIAL HISTORY: Social History   Socioeconomic History  . Marital status: Married    Spouse name: Not on file  . Number of children: 2  . Years of education: Not on file  . Highest education level: Not on file  Occupational History  . Not on file  Tobacco Use  . Smoking status: Never  . Smokeless tobacco: Never  Vaping Use  . Vaping status: Never Used  Substance and Sexual Activity  . Alcohol use: Yes    Alcohol/week: 4.0 - 5.0 standard drinks of alcohol    Types: 4 - 5 Standard drinks or equivalent per week    Comment: wine and beer on weekends  . Drug use: No  . Sexual activity: Yes    Birth control/protection: Surgical  Other Topics Concern  . Not on file  Social History Narrative  . Not on file   Social Drivers of Health   Financial  Resource Strain: Not on file  Food Insecurity: Not on file  Transportation Needs: Not on file  Physical Activity: Not on file  Stress: Not on file  Social Connections: Unknown (05/14/2021)   [facility] from [facility]   Social Network   . Social Network: Not on file  Intimate Partner Violence: Unknown (04/06/2021)   Received from [facility]   HITS   . Physically Hurt: Not on file   . Insult or Talk Down To: Not on file   . Threaten Physical Harm: Not on file   . Scream or Curse: Not on file    FAMILY HISTORY: Family History  Problem  Relation Age of Onset  . Diabetes Mother   . Hypertension Mother   . Spinal muscular atrophy Mother   . Irregular heart beat Mother   . Anxiety disorder Mother   . Arthritis Mother   . Cancer Other        maternal great grandfather dx. unspecified type cancer  . Heart attack Maternal Grandmother   . Heart attack Maternal Uncle   . Cancer Cousin        paternal 1st cousin dx. with cancer that was typically a childhood cancer  . Breast cancer Neg Hx     Review of Systems  Constitutional:  Negative for appetite change, chills, fatigue, fever and unexpected weight change.  HENT:   Negative for hearing loss, lump/mass and trouble swallowing.   Eyes:  Negative for eye problems and icterus.  Respiratory:  Negative for chest tightness, cough and shortness of breath.   Cardiovascular:  Negative for chest pain, leg swelling and palpitations.  Gastrointestinal:  Negative for abdominal distention, abdominal pain, constipation, diarrhea, nausea and vomiting.  Endocrine: Negative for hot flashes.  Genitourinary:  Negative for difficulty urinating.   Musculoskeletal:  Negative for arthralgias.  Skin:  Negative for itching and rash.  Neurological:  Negative for dizziness, extremity weakness, headaches and numbness.  Hematological:  Negative for adenopathy. Does not bruise/bleed easily.  Psychiatric/Behavioral:  Negative for depression. The patient is not nervous/anxious.       PHYSICAL EXAMINATION    Vitals:   03/13/23 1152  BP: (!) 142/81  Pulse: 72  Resp: 18  Temp: 98.3 F (36.8 C)  SpO2: 100%    Physical Exam Constitutional:      General: She is not in acute distress.    Appearance: Normal appearance. She is not toxic-appearing.  HENT:     Head: Normocephalic and atraumatic.     Mouth/Throat:     Mouth: Mucous membranes are moist.     Pharynx: Oropharynx is clear. No oropharyngeal exudate or posterior oropharyngeal erythema.  Eyes:     General: No scleral  icterus. Cardiovascular:     Rate and Rhythm: Normal rate and regular rhythm.     Pulses: Normal pulses.     Heart sounds: Normal heart sounds.  Pulmonary:     Effort: Pulmonary effort is normal.     Breath sounds: Normal breath sounds.  Abdominal:     General: Abdomen is flat. Bowel sounds are normal. There is no distension.     Palpations: Abdomen is soft.     Tenderness: There is no abdominal tenderness.  Musculoskeletal:        General: No swelling.     Cervical back: Neck supple.  Lymphadenopathy:     Cervical: No cervical adenopathy.  Skin:    General: Skin is warm and dry.  Findings: No rash.  Neurological:     General: No focal deficit present.     Mental Status: She is alert.  Psychiatric:        Mood and Affect: Mood normal.        Behavior: Behavior normal.    LABORATORY DATA:  CBC    Component Value Date/Time   WBC 5.5 03/13/2023 1104   WBC 4.0 02/05/2023 0514   RBC 3.29 (L) 03/13/2023 1104   HGB 10.4 (L) 03/13/2023 1104   HGB 12.2 06/22/2016 0858   HCT 32.4 (L) 03/13/2023 1104   HCT 37.5 06/22/2016 0858   PLT 252 03/13/2023 1104   PLT 305 06/22/2016 0858   MCV 98.5 03/13/2023 1104   MCV 93.8 06/22/2016 0858   MCH 31.6 03/13/2023 1104   MCHC 32.1 03/13/2023 1104   RDW 16.0 (H) 03/13/2023 1104   RDW 14.4 06/22/2016 0858   LYMPHSABS 2.5 03/13/2023 1104   LYMPHSABS 2.0 06/22/2016 0858   MONOABS 0.6 03/13/2023 1104   MONOABS 0.3 06/22/2016 0858   EOSABS 0.2 03/13/2023 1104   EOSABS 0.2 06/22/2016 0858   BASOSABS 0.1 03/13/2023 1104   BASOSABS 0.1 06/22/2016 0858    CMP     Component Value Date/Time   NA 136 03/13/2023 1104   NA 141 06/22/2016 0858   K 4.2 03/13/2023 1104   K 4.4 06/22/2016 0858   CL 105 03/13/2023 1104   CO2 25 03/13/2023 1104   CO2 22 06/22/2016 0858   GLUCOSE 110 (H) 03/13/2023 1104   GLUCOSE 110 06/22/2016 0858   BUN 19 03/13/2023 1104   BUN 11.8 06/22/2016 0858   CREATININE 0.77 03/13/2023 1104   CREATININE 0.8  06/22/2016 0858   CALCIUM 8.9 03/13/2023 1104   CALCIUM 9.1 06/22/2016 0858   PROT 7.0 03/13/2023 1104   PROT CANCELED 11/25/2022 1600   PROT 7.1 06/22/2016 0858   ALBUMIN 4.2 03/13/2023 1104   ALBUMIN 3.5 06/22/2016 0858   AST 31 03/13/2023 1104   AST 48 (H) 06/22/2016 0858   ALT 43 03/13/2023 1104   ALT 35 06/22/2016 0858   ALKPHOS 106 03/13/2023 1104   ALKPHOS 109 06/22/2016 0858   BILITOT 0.5 03/13/2023 1104   BILITOT 0.78 06/22/2016 0858   GFRNONAA >60 03/13/2023 1104   GFRNONAA >89 03/14/2014 1354   GFRAA >60 05/23/2018 0912   GFRAA >89 03/14/2014 1354        ASSESSMENT and THERAPY PLAN:   No problem-specific Assessment & Plan notes found for this encounter.   All questions were answered. The patient knows to call the clinic with any problems, questions or concerns. We can certainly see the patient much sooner if necessary. This note was electronically signed. Noreene Filbert, NP 03/13/2023

## 2023-03-13 NOTE — Telephone Encounter (Signed)
-----   Message from Noreene Filbert sent at 03/13/2023  2:46 PM EDT ----- Please let patient know her B12 is better. ----- Message ----- From: Leory Plowman, Lab [facility] [place] Sent: 03/13/2023  11:22 AM EDT To: [name], NP

## 2023-03-13 NOTE — Telephone Encounter (Signed)
Per Lillard Anes, DNP, called pt with message below, pt verbalized understanding.

## 2023-03-14 ENCOUNTER — Other Ambulatory Visit: Payer: Self-pay | Admitting: Family Medicine

## 2023-03-14 DIAGNOSIS — Z1231 Encounter for screening mammogram for malignant neoplasm of breast: Secondary | ICD-10-CM

## 2023-03-15 ENCOUNTER — Encounter: Payer: Self-pay | Admitting: Adult Health

## 2023-03-15 NOTE — Assessment & Plan Note (Signed)
 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  History of breast cancer.  No clinical or radiographic signs of breast cancer recurrence.  -Schedule left breast screening mammogram -Continue healthy diet and exercise -Continue annual breast exam -RTC in 6 monthys for continued long-term follow-up  Atrial Fibrillation Recent hospitalization due to rapid heart rate and AFib. Currently on Eliquis and potassium. No recent episodes of syncope or dizziness. -Continue Eliquis and potassium as prescribed. -Continue monitoring symptoms and report any changes.  Foot Fracture History of foot fracture with rod and screws placement. No current pain or discomfort. -Continue weight-bearing as tolerated.  Anxiety Request for anxiety medication refill. -Refill anxiety medication as requested and [facility] to [facility].  Vitamin B12 Deficiency Receiving B12 injections in the clinic. -Continue B12 injections as scheduled--every 4 weeks -recheck b12 level today  Weight Loss Patient reports intentional weight loss through dietary changes. No concerns of malnutrition. -Encourage continuation of healthy dietary habits.  Anemia Hemoglobin slightly low at 10.4 (normal around 12). Possible due to recent surgery and AFib. -Repeat labs in six months to monitor hemoglobin levels.  Follow-up Scheduled for six months, sooner if any issues arise.

## 2023-03-16 ENCOUNTER — Ambulatory Visit (HOSPITAL_COMMUNITY): Payer: BC Managed Care – PPO | Admitting: Internal Medicine

## 2023-03-27 ENCOUNTER — Telehealth: Payer: Self-pay | Admitting: Adult Health

## 2023-03-27 NOTE — Telephone Encounter (Signed)
 Rescheduled appointments per patients request via incoming call. Talked with the patient and she is aware of the changes made to her upcoming appointments.

## 2023-04-05 ENCOUNTER — Encounter: Payer: Self-pay | Admitting: Adult Health

## 2023-04-07 ENCOUNTER — Other Ambulatory Visit: Payer: Self-pay | Admitting: Adult Health

## 2023-04-10 ENCOUNTER — Inpatient Hospital Stay

## 2023-04-18 ENCOUNTER — Inpatient Hospital Stay: Attending: Adult Health

## 2023-04-18 DIAGNOSIS — E538 Deficiency of other specified B group vitamins: Secondary | ICD-10-CM | POA: Insufficient documentation

## 2023-04-18 MED ORDER — CYANOCOBALAMIN 1000 MCG/ML IJ SOLN
1000.0000 ug | Freq: Once | INTRAMUSCULAR | Status: AC
  Administered 2023-04-18: 1000 ug via INTRAMUSCULAR
  Filled 2023-04-18: qty 1

## 2023-04-20 ENCOUNTER — Ambulatory Visit: Payer: BC Managed Care – PPO | Admitting: Family Medicine

## 2023-05-02 ENCOUNTER — Other Ambulatory Visit: Payer: Self-pay | Admitting: Adult Health

## 2023-05-02 DIAGNOSIS — I10 Essential (primary) hypertension: Secondary | ICD-10-CM

## 2023-05-02 DIAGNOSIS — Z170 Estrogen receptor positive status [ER+]: Secondary | ICD-10-CM

## 2023-05-02 NOTE — Telephone Encounter (Signed)
 Left message for pt to return call to let us  know if she has a PCP yet.

## 2023-05-03 ENCOUNTER — Other Ambulatory Visit: Payer: Self-pay | Admitting: *Deleted

## 2023-05-03 DIAGNOSIS — I10 Essential (primary) hypertension: Secondary | ICD-10-CM

## 2023-05-03 DIAGNOSIS — F411 Generalized anxiety disorder: Secondary | ICD-10-CM

## 2023-05-03 MED ORDER — TRIAMTERENE-HCTZ 37.5-25 MG PO CAPS
1.0000 | ORAL_CAPSULE | Freq: Every day | ORAL | 1 refills | Status: DC
Start: 2023-05-03 — End: 2023-11-06

## 2023-05-03 MED ORDER — VENLAFAXINE HCL ER 37.5 MG PO CP24
37.5000 mg | ORAL_CAPSULE | Freq: Every day | ORAL | 1 refills | Status: AC
Start: 2023-05-03 — End: ?

## 2023-05-03 MED ORDER — HYDROXYZINE HCL 10 MG PO TABS: 20.0000 mg | ORAL_TABLET | Freq: Three times a day (TID) | ORAL | 0 refills | Status: AC | PRN

## 2023-05-08 ENCOUNTER — Inpatient Hospital Stay

## 2023-05-10 ENCOUNTER — Encounter (HOSPITAL_COMMUNITY): Payer: Self-pay

## 2023-05-16 ENCOUNTER — Inpatient Hospital Stay: Attending: Adult Health

## 2023-05-16 DIAGNOSIS — E538 Deficiency of other specified B group vitamins: Secondary | ICD-10-CM | POA: Diagnosis present

## 2023-05-16 MED ORDER — CYANOCOBALAMIN 1000 MCG/ML IJ SOLN
1000.0000 ug | Freq: Once | INTRAMUSCULAR | Status: AC
  Administered 2023-05-16: 1000 ug via INTRAMUSCULAR
  Filled 2023-05-16: qty 1

## 2023-05-17 ENCOUNTER — Ambulatory Visit
Admission: RE | Admit: 2023-05-17 | Discharge: 2023-05-17 | Disposition: A | Discharge status: Home / Self Care | Source: Ambulatory Visit | Attending: Family Medicine | Admitting: Family Medicine

## 2023-05-17 ENCOUNTER — Ambulatory Visit

## 2023-05-17 DIAGNOSIS — Z1231 Encounter for screening mammogram for malignant neoplasm of breast: Secondary | ICD-10-CM

## 2023-05-25 ENCOUNTER — Ambulatory Visit: Payer: BC Managed Care – PPO | Admitting: Dermatology

## 2023-06-05 ENCOUNTER — Inpatient Hospital Stay

## 2023-06-13 ENCOUNTER — Inpatient Hospital Stay: Attending: Adult Health

## 2023-07-03 ENCOUNTER — Inpatient Hospital Stay

## 2023-07-05 ENCOUNTER — Ambulatory Visit: Admitting: Physician Assistant

## 2023-07-05 ENCOUNTER — Encounter: Payer: Self-pay | Admitting: Physician Assistant

## 2023-07-05 VITALS — BP 134/94

## 2023-07-05 DIAGNOSIS — L209 Atopic dermatitis, unspecified: Secondary | ICD-10-CM | POA: Diagnosis not present

## 2023-07-05 DIAGNOSIS — L810 Postinflammatory hyperpigmentation: Secondary | ICD-10-CM

## 2023-07-05 DIAGNOSIS — L814 Other melanin hyperpigmentation: Secondary | ICD-10-CM

## 2023-07-05 MED ORDER — CLOBETASOL PROPIONATE 0.05 % EX CREA: TOPICAL_CREAM | CUTANEOUS | 3 refills | Status: DC

## 2023-07-05 NOTE — Progress Notes (Signed)
   New Patient Visit   Subjective  Brooke [name] is a 58 y.o. female who presents for the following: New Pt - Dry Skin - hyperpigmentation around eyes.   Patient presents today with concerns of dry skin on the back and hyperpigmentation around the eyes that she would like evaluated. Patient notes that the dry skin started a year ago that is itchy, rating it 9 out of 10 - the hyperpigmentation at the eyes that she noticed about 4 years ago. Patient has not been seen for the issues. She has tried OTC eye creams & a variety of lotions for the back and nothing was effective. Denied Hx of Bx. Denied Hx of family skin cancer.    The following portions of the chart were reviewed this encounter and updated as appropriate: medications, allergies, medical history  Review of Systems:  No other skin or systemic complaints except as noted in HPI or Assessment and Plan.  Objective  Well appearing patient in no apparent distress; mood and affect are within normal limits.   A focused examination was performed of the following areas: face, neck, back, chest and upper extremities.    Relevant exam findings are noted in the Assessment and Plan.    Assessment & Plan   POST-INFLAMMATORY HYPERPIGMENTATION (PIH) Exam: hyperpigmented macules and/or patches at face   This is a benign condition that comes from having previous inflammation in the skin and will fade with time over months to sometimes years. Recommend daily sun protection including sunscreen SPF 30+ to sun-exposed areas. - Recommend treating any itchy or red areas on the skin quickly to prevent new areas of PIH. Treating with prescription medicines such as hydroquinone may help fade dark spots faster.    Treatment Plan: - Rec. Plenty of sleep - Apply thin coat of Aquaphor nightly   ATOPIC DERMATITIS/ECZEMA Exam: Scaly pink papules coalescing to plaques 5% BSA  flared  Atopic dermatitis (eczema) is a chronic, relapsing, pruritic condition  that can significantly affect quality of life. It is often associated with allergic rhinitis and/or asthma and can require treatment with topical medications, phototherapy, or in severe cases biologic injectable medication (Dupixent; Adbry) or Oral JAK inhibitors.  Treatment Plan: - Rx Clobetasol cream - apply BID for 2 weeks, then break for 2 weeks, repeat PRN.   Recommend gentle skin care.    ATOPIC DERMATITIS, UNSPECIFIED TYPE   Related Medications clobetasol cream (TEMOVATE) 0.05 % apply BID for 2 weeks, then break for 2 weeks, repeat PRN. OTHER MELANIN HYPERPIGMENTATION    Return if symptoms worsen or fail to improve.    Documentation: I have reviewed the above documentation for accuracy and completeness, and I agree with the above.  I, Shirron Maranda, CMA, am acting as Neurosurgeon for [employer], PA-C.   Kryslyn Helbig K, PA-C

## 2023-07-05 NOTE — Patient Instructions (Signed)

## 2023-07-11 ENCOUNTER — Inpatient Hospital Stay: Attending: Adult Health

## 2023-07-11 ENCOUNTER — Inpatient Hospital Stay

## 2023-07-31 ENCOUNTER — Inpatient Hospital Stay

## 2023-08-08 ENCOUNTER — Inpatient Hospital Stay: Attending: Adult Health

## 2023-08-08 DIAGNOSIS — E538 Deficiency of other specified B group vitamins: Secondary | ICD-10-CM | POA: Insufficient documentation

## 2023-08-08 MED ORDER — CYANOCOBALAMIN 1000 MCG/ML IJ SOLN
1000.0000 ug | Freq: Once | INTRAMUSCULAR | Status: AC
  Administered 2023-08-08: 1000 ug via INTRAMUSCULAR
  Filled 2023-08-08: qty 1

## 2023-08-28 ENCOUNTER — Inpatient Hospital Stay

## 2023-09-01 ENCOUNTER — Other Ambulatory Visit: Payer: Self-pay | Admitting: *Deleted

## 2023-09-01 DIAGNOSIS — C50211 Malignant neoplasm of upper-inner quadrant of right female breast: Secondary | ICD-10-CM

## 2023-09-05 ENCOUNTER — Other Ambulatory Visit: Payer: Self-pay | Admitting: *Deleted

## 2023-09-05 ENCOUNTER — Inpatient Hospital Stay (HOSPITAL_BASED_OUTPATIENT_CLINIC_OR_DEPARTMENT_OTHER): Admitting: Adult Health

## 2023-09-05 ENCOUNTER — Inpatient Hospital Stay: Attending: Adult Health

## 2023-09-05 ENCOUNTER — Encounter: Payer: Self-pay | Admitting: Adult Health

## 2023-09-05 ENCOUNTER — Inpatient Hospital Stay

## 2023-09-05 VITALS — BP 109/73 | HR 68 | Temp 97.2°F | Resp 16 | Wt 222.4 lb

## 2023-09-05 DIAGNOSIS — Z170 Estrogen receptor positive status [ER+]: Secondary | ICD-10-CM

## 2023-09-05 DIAGNOSIS — E538 Deficiency of other specified B group vitamins: Secondary | ICD-10-CM | POA: Diagnosis present

## 2023-09-05 DIAGNOSIS — C50211 Malignant neoplasm of upper-inner quadrant of right female breast: Secondary | ICD-10-CM

## 2023-09-05 DIAGNOSIS — Z853 Personal history of malignant neoplasm of breast: Secondary | ICD-10-CM | POA: Diagnosis not present

## 2023-09-05 LAB — CBC WITH DIFFERENTIAL (CANCER CENTER ONLY)
Abs Immature Granulocytes: 0.01 K/uL (ref 0.00–0.07)
Basophils Absolute: 0.1 K/uL (ref 0.0–0.1)
Basophils Relative: 1 %
Eosinophils Absolute: 0.3 K/uL (ref 0.0–0.5)
Eosinophils Relative: 6 %
HCT: 34 % — ABNORMAL LOW (ref 36.0–46.0)
Hemoglobin: 10.8 g/dL — ABNORMAL LOW (ref 12.0–15.0)
Immature Granulocytes: 0 %
Lymphocytes Relative: 45 %
Lymphs Abs: 2.2 K/uL (ref 0.7–4.0)
MCH: 30.4 pg (ref 26.0–34.0)
MCHC: 31.8 g/dL (ref 30.0–36.0)
MCV: 95.8 fL (ref 80.0–100.0)
Monocytes Absolute: 0.4 K/uL (ref 0.1–1.0)
Monocytes Relative: 8 %
Neutro Abs: 2 K/uL (ref 1.7–7.7)
Neutrophils Relative %: 40 %
Platelet Count: 322 K/uL (ref 150–400)
RBC: 3.55 MIL/uL — ABNORMAL LOW (ref 3.87–5.11)
RDW: 14.6 % (ref 11.5–15.5)
WBC Count: 4.9 K/uL (ref 4.0–10.5)
nRBC: 0 % (ref 0.0–0.2)

## 2023-09-05 LAB — VITAMIN B12: Vitamin B-12: 397 pg/mL (ref 180–914)

## 2023-09-05 LAB — CMP (CANCER CENTER ONLY)
ALT: 15 U/L (ref 0–44)
AST: 20 U/L (ref 15–41)
Albumin: 4 g/dL (ref 3.5–5.0)
Alkaline Phosphatase: 107 U/L (ref 38–126)
Anion gap: 6 (ref 5–15)
BUN: 22 mg/dL — ABNORMAL HIGH (ref 6–20)
CO2: 25 mmol/L (ref 22–32)
Calcium: 8.8 mg/dL — ABNORMAL LOW (ref 8.9–10.3)
Chloride: 107 mmol/L (ref 98–111)
Creatinine: 0.82 mg/dL (ref 0.44–1.00)
GFR, Estimated: >60 mL/min (ref >60)
Glucose, Bld: 98 mg/dL (ref 70–99)
Potassium: 4 mmol/L (ref 3.5–5.1)
Sodium: 138 mmol/L (ref 135–145)
Total Bilirubin: 0.2 mg/dL (ref 0.0–1.2)
Total Protein: 6.7 g/dL (ref 6.5–8.1)

## 2023-09-05 MED ORDER — CYANOCOBALAMIN 1000 MCG/ML IJ SOLN
1000.0000 ug | INTRAMUSCULAR | Status: DC
  Administered 2023-09-05: 1000 ug via INTRAMUSCULAR
  Filled 2023-09-05: qty 1

## 2023-09-05 NOTE — Progress Notes (Signed)
 [facility] Cancer Follow up:    Brooke Brooke Pickle, NP [address] [place] [address]   DIAGNOSIS:  Cancer Staging  Malignant neoplasm of upper-inner quadrant of right breast in female, estrogen receptor positive (HCC) Staging form: Breast, AJCC 7th Edition - Clinical stage from 02/25/2015: Stage IIA (T2, N0, M0) - Unsigned Staged by: Pathologist and managing physician Laterality: Right Estrogen receptor status: Positive Progesterone receptor status: Positive HER2 status: Negative Stage used in treatment planning: Yes National guidelines used in treatment planning: Yes Type of national guideline used in treatment planning: NCCN - Pathologic stage from 04/06/2015: Stage IA (T1c, N0, cM0) - Unsigned Laterality: Right    SUMMARY OF ONCOLOGIC HISTORY: Oncology History  Malignant neoplasm of upper-inner quadrant of right breast in female, estrogen receptor positive (HCC)  02/12/2015 Mammogram   Right mammogram: UIQ of the right breast, anterior depth, spiculated mass with pleomorphic calcifications measuring ~1.5 cm mammographically. Approximately 5.5 cm posterior to this mass, there is a more subtle area of density with pleomorphic calcs   02/17/2015 Initial Biopsy   Right breast core needle bx: 2 lesions (separated by 10 cm): invasive ductal carcinoma, ER+, PR+, Ki67 25%, HER2/neu negative   02/17/2015 Clinical Stage   Stage II: T2 Nx   02/25/2015 -  Neo-Adjuvant Anti-estrogen oral therapy   Tamoxifen  20 mg began; held 05/2015 due to intolerable side effects   03/06/2015 Procedure   Breast/ Ovarian cancer panel ([facility]): no deleterious mutations at ATM, BARD1, BRCA1, BRCA2, BRIP1, CDH1, CHEK2, FANCC, MLH1, MSH2, MSH6, NBN, PALB2, PMS2, PTEN, RAD51C, RAD51D, TP53, and XRCC2.  VUS called c.662T>C (p.Ile221Thr) found XRCC2.   03/10/2015 Procedure   Left breast bx: ADH   03/23/2015 Surgery   Left lumpectomy: ductal hyperplasia   04/06/2015 Definitive  Surgery   Right mastectomy/SLNB: invasive ductal carcinoma, grade 2, negative margins, repeat HER2/neu negative   04/06/2015 Pathologic Stage   Stage IA: pT1c N0   04/06/2015 Oncotype testing   RS 5 (5% ROR)   06/22/2015 Survivorship   SCP mailed to patient at her request in lieu of in person visit     CURRENT THERAPY: observation/ b12 injection  INTERVAL HISTORY:  Discussed the use of AI scribe software for clinical note transcription with the patient, who gave verbal consent to proceed.  History of Present Illness Brooke [name] is a 58 year old female with stage 1A right breast invasive ductal carcinoma who presents for follow-up.  She has a history of stage 1A right breast invasive ductal carcinoma, ER and PR positive, with an oncotype score of five, indicating a low risk of recurrence. She underwent a mastectomy and is currently on observation alone. Her most recent left breast screening mammogram on May 17, 2023, showed no evidence of malignancy and breast density category B.  She experiences fatigue, which may be related to her B12 deficiency, as she missed her injections in June and July. She also experiences hot flashes, requiring her to sleep with a fan on.     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  MEDICAL HISTORY: Past Medical History:  Diagnosis Date   Alcohol use disorder, mild, abuse 06/26/2019   Anemia    Anxiety    Breast cancer (HCC)    Breast cancer of upper-inner quadrant of right female breast (HCC) 02/18/2015   Chronic headaches    Depression    Fatigue    Hot flashes    Hypertension    PAF (paroxysmal atrial fibrillation) (HCC) 12/08/2022   Syncope 11/2022   Yeast infection     SURGICAL HISTORY: Past Surgical History:  Procedure Laterality Date   BREAST IMPLANT REMOVAL Right 06/24/2022   Procedure: REMOVAL BREAST IMPLANT;  Surgeon: Arelia Filippo, MD;  Location: [facility];  Service: Plastics;  Laterality: Right;   BREAST LUMPECTOMY Left 2017   BREAST LUMPECTOMY WITH RADIOACTIVE SEED LOCALIZATION Left 04/06/2015   BREAST LUMPECTOMY WITH RADIOACTIVE SEED LOCALIZATION Left 04/06/2015   Procedure: LEFT BREAST LUMPECTOMY WITH RADIOACTIVE SEED LOCALIZATION;  Surgeon: Alm Angle, MD;  Location: [facility];  Service: General;  Laterality: Left;   BREAST RECONSTRUCTION WITH PLACEMENT OF TISSUE EXPANDER AND FLEX HD (ACELLULAR HYDRATED DERMIS) Right 04/06/2015   Procedure: RIGHT BREAST RECONSTRUCTION WITH PLACEMENT OF TISSUE EXPANDER, POSSIBLE ACELLULAR DERMIS;  Surgeon: Filippo Arelia, MD;  Location: [facility] OR;  Service: Plastics;  Laterality: Right;   BREAST REDUCTION SURGERY Left 08/03/2015   Procedure: LEFT BREAST REDUCTION FOR SYMMETRY  (BREAST);  Surgeon: Filippo Arelia, MD;  Location: [facility];  Service: Plastics;  Laterality: Left;   CAPSULECTOMY Right 06/24/2022   Procedure: CAPSULECTOMY;  Surgeon: Arelia Filippo, MD;  Location: [facility];  Service: Plastics;  Laterality: Right;   CHOLECYSTECTOMY     GASTRIC BYPASS     LIPOSUCTION WITH LIPOFILLING N/A 08/03/2015   Procedure: LIPOSUCTION WITH LIPOFILLING ;  Surgeon: Filippo Arelia, MD;  Location: [facility];  Service: Plastics;  Laterality: N/A;   MASTECTOMY Right 2017   MASTECTOMY W/ SENTINEL NODE BIOPSY Right    MASTECTOMY W/ SENTINEL NODE BIOPSY Right 04/06/2015   Procedure: RIGHT MASTECTOMY WITH SENTINEL LYMPH NODE BIOPSY;  Surgeon: Alm Angle, MD;  Location: [facility];  Service: General;  Laterality: Right;   OPEN REDUCTION INTERNAL FIXATION (ORIF) TIBIA/FIBULA FRACTURE Left 12/27/2022   Procedure: OPEN REDUCTION INTERNAL FIXATION (ORIF) TIBIA/FIBULA FRACTURE, OPEN TREATMENT OF SYNDESMOSIS;  Surgeon: Elsa Lonni SAUNDERS, MD;  Location: [facility];  Service: Orthopedics;  Laterality: Left;   REDUCTION MAMMAPLASTY Left 2017   REMOVAL OF BILATERAL TISSUE EXPANDERS WITH PLACEMENT OF BILATERAL BREAST IMPLANTS Right 08/03/2015   Procedure: REMOVAL OF RIGHT TISSUE EXPANDERS WITH PLACEMENT OF IMPLANT;  Surgeon: Filippo Arelia, MD;  Location: [facility];  Service: Plastics;  Laterality: Right;   TUBAL LIGATION      SOCIAL HISTORY: Social History   Socioeconomic History   Marital status: Married    Spouse name: Not on file   Number of children: 2   Years of education: Not on file   Highest education level:  Professional school degree (e.g., MD, DDS, DVM, JD)  Occupational History   Not on file  Tobacco Use   Smoking status: Never   Smokeless tobacco: Never  Vaping Use   Vaping status: Never Used  Substance and Sexual Activity   Alcohol use: Yes    Alcohol/week: 4.0 - 5.0 standard drinks of alcohol    Types: 4 - 5 Standard drinks or equivalent per week    Comment: wine and beer on weekends   Drug use: No   Sexual activity: Yes    Birth control/protection: Surgical  Other Topics Concern   Not on file  Social History  Narrative   Not on file   Social Drivers of Health   Financial Resource Strain: Low Risk  (04/16/2023)   Overall Financial Resource Strain (CARDIA)    Difficulty of Paying Living Expenses: Not very hard  Food Insecurity: No Food Insecurity (04/16/2023)   Hunger Vital Sign    Worried About Running Out of Food in the Last Year: Never true    Ran Out of Food in the Last Year: Never true  Transportation Needs: No Transportation Needs (04/16/2023)   PRAPARE - Administrator, Civil Service (Medical): No    Lack of Transportation (Non-Medical): No  Physical Activity: Unknown (04/16/2023)   Exercise Vital Sign    Days of Exercise per Week: 0 days    Minutes of Exercise per Session: Not on file  Stress: Stress Concern Present (04/16/2023)   [employer] of Occupational Health - Occupational Stress Questionnaire    Feeling of Stress : Very much  Social Connections: Socially Integrated (04/16/2023)   Social Connection and Isolation Panel    Frequency of Communication with Friends and Family: More than three times a week    Frequency of Social Gatherings with Friends and Family: Once a week    Attends Religious Services: More than 4 times per year    Active Member of [employer] or Organizations: Yes    Attends Banker Meetings: More than 4 times per year    Marital Status: Married  Catering manager Violence: Unknown (04/06/2021)   Received from [facility]   HITS    Physically Hurt: Not on file    Insult or Talk Down To: Not on file    Threaten Physical Harm: Not on file    Scream or Curse: Not on file    FAMILY HISTORY: Family History  Problem Relation Age of Onset   Diabetes Mother    Hypertension Mother    Spinal muscular atrophy Mother    Irregular heart beat Mother    Anxiety disorder Mother    Arthritis Mother    Cancer Other        maternal great grandfather dx. unspecified type cancer   Heart attack Maternal Grandmother    Heart attack Maternal Uncle     Cancer Cousin        paternal 1st cousin dx. with cancer that was typically a childhood cancer   Breast cancer Neg Hx     Review of Systems  Constitutional:  Positive for fatigue. Negative for appetite change, chills, fever and unexpected weight change.  HENT:   Negative for hearing loss, lump/mass and trouble swallowing.   Eyes:  Negative for eye problems and icterus.  Respiratory:  Negative for chest tightness, cough and shortness of breath.   Cardiovascular:  Negative for chest pain, leg swelling and palpitations.  Gastrointestinal:  Negative for abdominal distention, abdominal pain, constipation,  diarrhea, nausea and vomiting.  Endocrine: Positive for hot flashes.  Genitourinary:  Negative for difficulty urinating.   Musculoskeletal:  Negative for arthralgias.  Skin:  Negative for itching and rash.  Neurological:  Negative for dizziness, extremity weakness, headaches and numbness.  Hematological:  Negative for adenopathy. Does not bruise/bleed easily.  Psychiatric/Behavioral:  Negative for depression. The patient is not nervous/anxious.       PHYSICAL EXAMINATION   Onc Performance Status - 09/05/23 1404       ECOG Perf Status   ECOG Perf Status Fully active, able to carry on all pre-disease performance without restriction      KPS SCALE   KPS % SCORE Normal, no compliants, no evidence of disease          Vitals:   09/05/23 1407  BP: 109/73  Pulse: 68  Resp: 16  Temp: (!) 97.2 F (36.2 C)  SpO2: 100%    Physical Exam Constitutional:      General: She is not in acute distress.    Appearance: Normal appearance. She is not toxic-appearing.  HENT:     Head: Normocephalic and atraumatic.     Mouth/Throat:     Mouth: Mucous membranes are moist.     Pharynx: Oropharynx is clear. No oropharyngeal exudate or posterior oropharyngeal erythema.  Eyes:     General: No scleral icterus. Cardiovascular:     Rate and Rhythm: Normal rate and regular rhythm.     Pulses:  Normal pulses.     Heart sounds: Normal heart sounds.  Pulmonary:     Effort: Pulmonary effort is normal.     Breath sounds: Normal breath sounds.  Chest:     Comments: Right breast s/p mastectomy, no sign of local recurrence, left breast benign Abdominal:     General: Abdomen is flat. Bowel sounds are normal. There is no distension.     Palpations: Abdomen is soft.     Tenderness: There is no abdominal tenderness.  Musculoskeletal:        General: No swelling.     Cervical back: Neck supple.  Lymphadenopathy:     Cervical: No cervical adenopathy.     Upper Body:     Right upper body: No supraclavicular or axillary adenopathy.     Left upper body: No supraclavicular or axillary adenopathy.  Skin:    General: Skin is warm and dry.     Findings: No rash.  Neurological:     General: No focal deficit present.     Mental Status: She is alert.  Psychiatric:        Mood and Affect: Mood normal.        Behavior: Behavior normal.     LABORATORY DATA:  CBC    Component Value Date/Time   WBC 4.9 09/05/2023 1345   WBC 4.0 02/05/2023 0514   RBC 3.55 (L) 09/05/2023 1345   HGB 10.8 (L) 09/05/2023 1345   HGB 12.2 06/22/2016 0858   HCT 34.0 (L) 09/05/2023 1345   HCT 37.5 06/22/2016 0858   PLT 322 09/05/2023 1345   PLT 305 06/22/2016 0858   MCV 95.8 09/05/2023 1345   MCV 93.8 06/22/2016 0858   MCH 30.4 09/05/2023 1345   MCHC 31.8 09/05/2023 1345   RDW 14.6 09/05/2023 1345   RDW 14.4 06/22/2016 0858   LYMPHSABS 2.2 09/05/2023 1345   LYMPHSABS 2.0 06/22/2016 0858   MONOABS 0.4 09/05/2023 1345   MONOABS 0.3 06/22/2016 0858   EOSABS 0.3 09/05/2023 1345   EOSABS  0.2 06/22/2016 0858   BASOSABS 0.1 09/05/2023 1345   BASOSABS 0.1 06/22/2016 0858    CMP     Component Value Date/Time   NA 138 09/05/2023 1345   NA 141 06/22/2016 0858   K 4.0 09/05/2023 1345   K 4.4 06/22/2016 0858   CL 107 09/05/2023 1345   CO2 25 09/05/2023 1345   CO2 22 06/22/2016 0858   GLUCOSE 98  09/05/2023 1345   GLUCOSE 110 06/22/2016 0858   BUN 22 (H) 09/05/2023 1345   BUN 11.8 06/22/2016 0858   CREATININE 0.82 09/05/2023 1345   CREATININE 0.8 06/22/2016 0858   CALCIUM 8.8 (L) 09/05/2023 1345   CALCIUM 9.1 06/22/2016 0858   PROT 6.7 09/05/2023 1345   PROT CANCELED 11/25/2022 1600   PROT 7.1 06/22/2016 0858   ALBUMIN 4.0 09/05/2023 1345   ALBUMIN 3.5 06/22/2016 0858   AST 20 09/05/2023 1345   AST 48 (H) 06/22/2016 0858   ALT 15 09/05/2023 1345   ALT 35 06/22/2016 0858   ALKPHOS 107 09/05/2023 1345   ALKPHOS 109 06/22/2016 0858   BILITOT 0.2 09/05/2023 1345   BILITOT 0.78 06/22/2016 0858   GFRNONAA >60 09/05/2023 1345   GFRNONAA >89 03/14/2014 1354   GFRAA >60 05/23/2018 0912   GFRAA >89 03/14/2014 1354     ASSESSMENT and THERAPY PLAN:   Assessment and Plan Assessment & Plan History of right breast invasive ductal carcinoma (stage 1A, ER/PR+) Stage 1A right breast invasive ductal carcinoma, ER/PR positive, Oncotype score 5, 5% recurrence risk. Post mastectomy, under observation. Recent left breast mammogram negative for malignancy, breast density category B. - No clinical or radiographic sign of breast cancer recurrence - Continue with annual left breast mammogram - Counseled on continuing healthy diet and exercise  Vitamin B12 deficiency Vitamin B12 deficiency managed with monthly injections. Missed June and July doses may have lowered levels. - Schedule B12 injection appointments. - Schedule lab appointments to check B12 levels.  RTC every 4 weeks for b12 injection and lab visit in 6 months and in 12 months (approximately).    All questions were answered. The patient knows to call the clinic with any problems, questions or concerns. We can certainly see the patient much sooner if necessary.  Total encounter time:20 minutes*in face-to-face visit time, chart review, lab review, care coordination, order entry, and documentation of the encounter  time.    Brooke Kendall, NP 09/06/23 3:50 PM Medical Oncology and Hematology [facility] [address] [place], [place] [address] Tel. [phone]    Fax. [phone]  *Total Encounter Time as defined by the Centers for Medicare and Medicaid Services includes, in addition to the face-to-face time of a patient visit (documented in the note above) non-face-to-face time: obtaining and reviewing outside history, ordering and reviewing medications, tests or procedures, care coordination (communications with other health care professionals or caregivers) and documentation in the medical record.

## 2023-09-10 ENCOUNTER — Encounter: Payer: Self-pay | Admitting: Adult Health

## 2023-09-11 ENCOUNTER — Ambulatory Visit: Payer: Self-pay

## 2023-09-25 ENCOUNTER — Inpatient Hospital Stay

## 2023-10-23 ENCOUNTER — Other Ambulatory Visit: Payer: Self-pay

## 2023-10-23 ENCOUNTER — Inpatient Hospital Stay

## 2023-10-27 ENCOUNTER — Ambulatory Visit: Attending: Internal Medicine | Admitting: Internal Medicine

## 2023-10-27 ENCOUNTER — Encounter: Payer: Self-pay | Admitting: Adult Health

## 2023-10-27 VITALS — BP 106/60 | HR 66 | Ht 71.0 in | Wt 223.0 lb

## 2023-10-27 DIAGNOSIS — I10 Essential (primary) hypertension: Secondary | ICD-10-CM | POA: Diagnosis not present

## 2023-10-27 DIAGNOSIS — Z8669 Personal history of other diseases of the nervous system and sense organs: Secondary | ICD-10-CM | POA: Diagnosis not present

## 2023-10-27 DIAGNOSIS — I480 Paroxysmal atrial fibrillation: Secondary | ICD-10-CM | POA: Diagnosis not present

## 2023-10-27 MED ORDER — RIVAROXABAN 20 MG PO TABS: 20.0000 mg | ORAL_TABLET | Freq: Every day | ORAL | 3 refills | Status: AC

## 2023-10-27 NOTE — Patient Instructions (Signed)
 Medication Instructions:  Stop Eliquis  Start Xarelto 20 mg daily with supper Continue all other medications *If you need a refill on your cardiac medications before your next appointment, please call your pharmacy*  Lab Work: None ordered  Testing/Procedures: None ordered  Follow-Up: At [facility], you and your health needs are our priority.  As part of our continuing mission to provide you with exceptional heart care, our providers are all part of one team.  This team includes your primary Cardiologist (physician) and Advanced Practice Providers or APPs (Physician Assistants and Nurse Practitioners) who all work together to provide you with the care you need, when you need it.  Your next appointment:  1 year   Call in May to schedule Oct appointment     Provider:  Dr.Chandrasekar   We recommend signing up for the patient portal called MyChart.  Sign up information is provided on this After Visit Summary.  MyChart is used to connect with patients for Virtual Visits (Telemedicine).  Patients are able to view lab/test results, encounter notes, upcoming appointments, etc.  Non-urgent messages can be sent to your provider as well.   To learn more about what you can do with MyChart, go to [email or web address].

## 2023-10-27 NOTE — Progress Notes (Signed)
 Cardiology Office Note:  .    Date:  10/27/2023  ID:  Brooke [name], DOB [birth year], MRN [id number] PCP: Crawford Morna Pickle, NP  [facility] Providers Cardiologist:  None     CC: Follow up AF  History of Present Illness: .    Brooke [name] is a 58 y.o. female with a history gastric bypass and breast cancer with Fhx of ATTR-CA with fx of Val 122LLe  Brooke [name] is a 58 year old female with atrial fibrillation and genetic ATTR cardiac amyloidosis who presents for follow-up of her cardiac condition.  She recently experienced an episode suggestive of atrial fibrillation, which she managed by taking deep breaths. She describes her heart as feeling like it was 'just beating'. She uses an Apple Watch to monitor her heart rate, though it sometimes fails to provide accurate readings.  She has new-onset headaches and has reduced her physical activity due to increased work commitments, now primarily walking on weekends.  She is attempting to lose weight but finds it challenging despite fasting and maintaining a low-calorie diet. She suspects she is postmenopausal, experiencing hot flashes and difficulty losing weight. She has abstained from alcohol for the past 85 days, having previously consumed wine regularly.  She is currently taking Eliquis  once daily. She feels tired and experiences episodes of feeling unwell while driving.  She experiences bone-on-bone knee pain and is considering surgery if weight loss does not alleviate the pain.  She has a family history of ATTR cardiac amyloidosis, with a cousin participating in a clinical trial for the condition. She carries the gene but did not qualify for the trial due to lack of evidence of cardiac involvement.  Relevant histories: .  Social comes with sister (from [place]), hx of Val 122lle with one family member with ATTR-CA, no ATTR- PN; Cousin is going to be in [place] - no alcohol 2024: Established care ROS: As per  HPI.   Physical Exam:    VS:  BP 106/60 (BP Location: Left Arm, Patient Position: Sitting, Cuff Size: Large)   Pulse 66   Ht 5' 11 (1.803 m)   Wt 223 lb (101.2 kg)   LMP 04/05/2016 (Approximate)   SpO2 99%   BMI 31.10 kg/m    Wt Readings from Last 3 Encounters:  10/27/23 223 lb (101.2 kg)  09/05/23 222 lb 6.4 oz (100.9 kg)  03/13/23 209 lb 12.8 oz (95.2 kg)    Gen: No distress  Neck: No JVD Cardiac: No Rubs or Gallops, no murmur, RRR +2 radial pulses Respiratory: Clear to auscultation bilaterally, normal effort, normal  respiratory rate GI: Soft, nontender, non-distended  MS: No edema; Integument: Skin feels warm Neuro:  At time of evaluation, alert and oriented to person/place/time/situation  Psych: normal affect   ASSESSMENT AND PLAN: .    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 Discussed caloric intake and exercise regimen. - Encourage caloric deficit of 200-300 calories per day - Recommend strength training 2-3 times per  week  Longitudinal care: The evaluation and management services provided today reflect the complexity inherent in caring for this patient, including the ongoing longitudinal relationship and management of multiple chronic conditions and/or the need for care coordination. The visit required a comprehensive assessment and management plan tailored to the patient's unique needs Time was spent addressing not only the acute  concerns but also the broader context of the patient's health, including preventive care, chronic disease management, and care coordination as appropriate.  Complex longitudinal is necessary for conditions including: ATTR- carrier only, PN and CA mgmt    Stanly Leavens, MD FASE [facility] Cardiologist [facility]  [address], [address] [place], [place] [address] [phone]  2:54 PM

## 2023-11-05 ENCOUNTER — Other Ambulatory Visit: Payer: Self-pay | Admitting: Adult Health

## 2023-11-05 DIAGNOSIS — I10 Essential (primary) hypertension: Secondary | ICD-10-CM

## 2023-11-20 ENCOUNTER — Inpatient Hospital Stay

## 2023-11-29 ENCOUNTER — Telehealth: Payer: Self-pay | Admitting: Adult Health

## 2023-11-29 NOTE — Telephone Encounter (Signed)
 Patient called in to schedule B12 injections that are q28. Patient aware of date/times.

## 2023-12-01 ENCOUNTER — Inpatient Hospital Stay: Attending: Adult Health

## 2023-12-01 DIAGNOSIS — E538 Deficiency of other specified B group vitamins: Secondary | ICD-10-CM | POA: Insufficient documentation

## 2023-12-01 MED ORDER — CYANOCOBALAMIN 1000 MCG/ML IJ SOLN
1000.0000 ug | Freq: Once | INTRAMUSCULAR | Status: AC
  Administered 2023-12-01: 1000 ug via INTRAMUSCULAR
  Filled 2023-12-01: qty 1

## 2023-12-18 ENCOUNTER — Inpatient Hospital Stay

## 2024-01-01 ENCOUNTER — Ambulatory Visit: Admitting: Family Medicine

## 2024-01-02 ENCOUNTER — Inpatient Hospital Stay: Attending: Adult Health

## 2024-01-02 ENCOUNTER — Other Ambulatory Visit (HOSPITAL_COMMUNITY): Payer: Self-pay

## 2024-01-02 DIAGNOSIS — E538 Deficiency of other specified B group vitamins: Secondary | ICD-10-CM | POA: Diagnosis present

## 2024-01-02 MED ORDER — CYANOCOBALAMIN 1000 MCG/ML IJ SOLN
1000.0000 ug | INTRAMUSCULAR | Status: DC
  Administered 2024-01-02: 1000 ug via INTRAMUSCULAR
  Filled 2024-01-02: qty 1

## 2024-01-12 ENCOUNTER — Ambulatory Visit: Admitting: Family Medicine

## 2024-01-12 VITALS — BP 118/62 | HR 63 | Temp 98.1°F | Ht 71.0 in | Wt 240.6 lb

## 2024-01-12 DIAGNOSIS — Z780 Asymptomatic menopausal state: Secondary | ICD-10-CM | POA: Diagnosis not present

## 2024-01-12 DIAGNOSIS — Z7689 Persons encountering health services in other specified circumstances: Secondary | ICD-10-CM

## 2024-01-12 DIAGNOSIS — Z853 Personal history of malignant neoplasm of breast: Secondary | ICD-10-CM | POA: Diagnosis not present

## 2024-01-12 DIAGNOSIS — Z9884 Bariatric surgery status: Secondary | ICD-10-CM

## 2024-01-12 DIAGNOSIS — R635 Abnormal weight gain: Secondary | ICD-10-CM

## 2024-01-12 DIAGNOSIS — R5383 Other fatigue: Secondary | ICD-10-CM

## 2024-01-12 DIAGNOSIS — I480 Paroxysmal atrial fibrillation: Secondary | ICD-10-CM

## 2024-01-12 NOTE — Progress Notes (Signed)
 "  Established Patient Office Visit   Subjective  Patient ID: Brooke [name], female    DOB: 1965-05-02  Age: 59 y.o. MRN: [id number]  Chief Complaint  Patient presents with   New Patient (Initial Visit)    Patient is a 59 year old female seen for establish care and follow-up on chronic conditions  H/o breast cancer, dx'd in 2017.  S/p R mastectomy and L lumpectomy.  Was on tamoxifen .  Continues follow-up at the cancer center. No family history of breast cancer.  Atrial fibrillation, noted after a fall last year.  Asymptomatic x several months. On toprol  xl and xarelto .  HTN:  does not check bp at home.  Taking triamterene -hydrochlorothiazide  37.5-25 and potassium supplements.  Endorses fatigue.  Endorses menopausal symptoms, including hot flashes, night sweats, and a craving for chocolate. She has gained weight since a fall in 2024 injuring foot.  Had gastric bypass 26+yrs ago.  Taking MVI.  Difficulty losing wt.  Too tired after work to exercise.  Eating breakfast, lunch, and dinner, with a preference for eggs, oatmeal, vegetables, and occasionally chicken. Does not eat beef or pork.   She reports poor sleep due to hot flashes and frequent urination  thinks she may snore as has woken herself up while sleeping. Drinking six to eight bottles of water  daily.  Recently started drinking flavored sparkling water . Takes triamterene /hydrochlorothiazide , which increases her urination.  Allergies: NKDA  Past surgical hx: Tubal ligation 2000 Gastric bypass L Foot surgery for fx 2024  Health care maintenance: Has not had a complete physical in a while but has regular blood work done at the cancer center.  No recent Pap smear LMP in 2017 before since her breast cancer surgery.     Patient Active Problem List   Diagnosis Date Noted   Atrial fibrillation with RVR (HCC) 02/04/2023   Status post open reduction and internal fixation (ORIF) of fracture 12/27/2022   Paroxysmal atrial fibrillation  (HCC) 12/15/2022   Syncope 11/21/2022   H/O peripheral neuropathy 11/21/2022   Abnormal electrocardiogram (ECG) (EKG) 11/21/2022   B12 deficiency 07/24/2021   Essential hypertension 03/19/2021   Contusion of right foot 01/28/2020   Acute bronchitis 08/08/2019   Major depressive disorder, recurrent episode, mild 06/26/2019   GAD (generalized anxiety disorder) 06/26/2019   Alcohol use disorder, mild, abuse 06/26/2019   Contusion of right knee 05/01/2016   Atypical ductal hyperplasia of left breast 04/22/2015   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 04/14/2015   Genetic testing 03/09/2015   Malignant neoplasm of upper-inner quadrant of right breast in female, estrogen receptor positive (HCC) 02/18/2015   Generalized hyperhidrosis 06/23/2014   Chronic headaches    Past Medical History:  Diagnosis Date   Alcohol use disorder, mild, abuse 06/26/2019   Anemia    Anxiety    Breast cancer (HCC)    Breast cancer of upper-inner quadrant of right female breast (HCC) 02/18/2015   Chronic headaches    Depression    Fatigue    Hot flashes    Hypertension    PAF (paroxysmal atrial fibrillation) (HCC) 12/08/2022   Syncope 11/2022   Yeast infection    Past Surgical History:  Procedure Laterality Date   BREAST IMPLANT REMOVAL Right 06/24/2022   Procedure: REMOVAL BREAST IMPLANT;  Surgeon: Arelia Filippo, MD;  Location: [facility];  Service: Plastics;  Laterality: Right;   BREAST LUMPECTOMY Left 2017   BREAST LUMPECTOMY WITH RADIOACTIVE SEED LOCALIZATION Left 04/06/2015   BREAST LUMPECTOMY WITH RADIOACTIVE  SEED LOCALIZATION Left 04/06/2015   Procedure: LEFT BREAST LUMPECTOMY WITH RADIOACTIVE SEED LOCALIZATION;  Surgeon: Alm Angle, MD;  Location: [facility];  Service: General;  Laterality: Left;   BREAST RECONSTRUCTION WITH PLACEMENT OF TISSUE EXPANDER AND FLEX HD (ACELLULAR HYDRATED DERMIS) Right 04/06/2015   Procedure: RIGHT BREAST RECONSTRUCTION WITH PLACEMENT OF TISSUE  EXPANDER, POSSIBLE ACELLULAR DERMIS;  Surgeon: Earlis Ranks, MD;  Location: [facility];  Service: Plastics;  Laterality: Right;   BREAST REDUCTION SURGERY Left 08/03/2015   Procedure: LEFT BREAST REDUCTION FOR SYMMETRY  (BREAST);  Surgeon: Earlis Ranks, MD;  Location: [facility];  Service: Plastics;  Laterality: Left;   CAPSULECTOMY Right 06/24/2022   Procedure: CAPSULECTOMY;  Surgeon: Ranks Earlis, MD;  Location: [facility];  Service: Plastics;  Laterality: Right;   CHOLECYSTECTOMY     COSMETIC SURGERY     GASTRIC BYPASS     LIPOSUCTION WITH LIPOFILLING N/A 08/03/2015   Procedure: LIPOSUCTION WITH LIPOFILLING ;  Surgeon: Earlis Ranks, MD;  Location: [facility];  Service: Plastics;  Laterality: N/A;   MASTECTOMY Right 2017   MASTECTOMY W/ SENTINEL NODE BIOPSY Right    MASTECTOMY W/ SENTINEL NODE BIOPSY Right 04/06/2015   Procedure: RIGHT MASTECTOMY WITH SENTINEL LYMPH NODE BIOPSY;  Surgeon: Alm Angle, MD;  Location: [facility];  Service: General;  Laterality: Right;   OPEN REDUCTION INTERNAL FIXATION (ORIF) TIBIA/FIBULA FRACTURE Left 12/27/2022   Procedure: OPEN REDUCTION INTERNAL FIXATION (ORIF) TIBIA/FIBULA FRACTURE, OPEN TREATMENT OF SYNDESMOSIS;  Surgeon: Elsa Lonni SAUNDERS, MD;  Location: [facility];  Service: Orthopedics;  Laterality: Left;   REDUCTION MAMMAPLASTY Left 2017   REMOVAL OF BILATERAL TISSUE EXPANDERS WITH PLACEMENT OF BILATERAL BREAST IMPLANTS Right 08/03/2015   Procedure: REMOVAL OF RIGHT TISSUE EXPANDERS WITH PLACEMENT OF IMPLANT;  Surgeon: Earlis Ranks, MD;  Location: [facility];  Service: Plastics;  Laterality: Right;   TUBAL LIGATION     Social History[1] Family History  Problem Relation Age of Onset   Diabetes Mother    Hypertension Mother    Spinal muscular atrophy Mother    Irregular heart beat Mother    Anxiety disorder Mother    Arthritis Mother    Cancer Other        maternal great  grandfather dx. unspecified type cancer   Heart attack Maternal Grandmother    Heart attack Maternal Uncle    Cancer Cousin        paternal 1st cousin dx. with cancer that was typically a childhood cancer   Breast cancer Neg Hx    Allergies[2]  ROS Negative unless stated above    Objective:     BP 118/62 (BP Location: Left Arm, Patient Position: Sitting, Cuff Size: Large)   Pulse 63   Temp 98.1 F (36.7 C) (Oral)   Ht 5' 11 (1.803 m)   Wt 240 lb 9.6 oz (109.1 kg)   LMP 04/05/2016   SpO2 100%   BMI 33.56 kg/m  BP Readings from Last 3 Encounters:  01/12/24 118/62  10/27/23 106/60  09/05/23 109/73   Wt Readings from Last 3 Encounters:  01/12/24 240 lb 9.6 oz (109.1 kg)  10/27/23 223 lb (101.2 kg)  09/05/23 222 lb 6.4 oz (100.9 kg)      Physical Exam Constitutional:      General: She is not in acute distress.    Appearance: Normal appearance.  HENT:     Head: Normocephalic and atraumatic.     Nose: Nose normal.  Mouth/Throat:     Mouth: Mucous membranes are moist.  Cardiovascular:     Rate and Rhythm: Normal rate and regular rhythm.     Heart sounds: Normal heart sounds. No murmur heard.    No gallop.  Pulmonary:     Effort: Pulmonary effort is normal. No respiratory distress.     Breath sounds: Normal breath sounds. No wheezing, rhonchi or rales.  Skin:    General: Skin is warm and dry.  Neurological:     Mental Status: She is alert and oriented to person, place, and time.        01/12/2024    4:00 PM 09/05/2023    2:04 PM 08/25/2021    4:04 PM  Depression screen PHQ 2/9  Decreased Interest 0 0 0  Down, Depressed, Hopeless 0 0 0  PHQ - 2 Score 0 0 0  Altered sleeping 1  2  Tired, decreased energy 1  2  Change in appetite 1  1  Feeling bad or failure about yourself  0  0  Trouble concentrating 0  0  Moving slowly or fidgety/restless 0  0  Suicidal thoughts 0  0  PHQ-9 Score 3  5   Difficult doing work/chores   Not difficult at all     Data  saved with a previous flowsheet row definition      01/12/2024    4:00 PM  GAD 7 : Generalized Anxiety Score  Nervous, Anxious, on Edge 0  Control/stop worrying 0  Worry too much - different things 1  Trouble relaxing 0  Restless 0  Easily annoyed or irritable 1  Afraid - awful might happen 0  Total GAD 7 Score 2  Anxiety Difficulty Not difficult at all     No results found for any visits on 01/12/24.    Assessment & Plan:   Paroxysmal atrial fibrillation (HCC)  Weight gain  Encounter to establish care  History of gastric bypass  Menopause  Fatigue, unspecified type  History of breast cancer   History of breast cancer, dx'd 2017 with R upper inner quadrant breast ca s/p R mastectomy and left lymph node removal. H/o L atypical ductal hyperplasia.  Continues surveillance and f/u w/ the cancer center.  Atrial fibrillation managed with Toprol  XL 25 mg at bedtime.  Continue follow-up with cardiology.  Menopause  symptoms including hot flashes, night sweats, and weight gain, with no periods since 2017. Advised Menopause likely affecting weight loss efforts. Supportive care for symptoms.  Discussed OTC meds vs rx.  Fatigue likely multifactorial including snoring/possible OSA, medications, deconditioning, vitamin or electrolyte abnormality.  Consider sleep study.  Check labs at visit for CPE.  Wt gain.  S/p gastric bypass.  Body mass index is 33.56 kg/m.  Weight gain likely from menopause and activity changes as noted after surgery in 2024.  Discussed meal prepping and exercise. Consider referral to a weight management program.  Check thyroid  function as my be contributing.  Advised on the importance of taking daily MVI.  General health maintenance   Schedule CPE.  Per chart review last pap 08/31/21.  -We reviewed the PMH, PSH, FH, SH, Meds and Allergies. -We provided refills for any medications we will prescribe as needed. -We addressed current concerns per orders and  patient instructions. -We have asked for records for pertinent exams, studies, vaccines and notes from previous providers. -We have advised patient to follow up per instructions below.  I personally spent a total of 41 minutes in the  care of the patient today including getting/reviewing separately obtained history, performing a medically appropriate exam/evaluation, counseling and educating, placing orders, documenting clinical information in the EHR, independently interpreting results, communicating results, and coordinating care.   Return CPE.   Clotilda JONELLE Single, MD     [1]  Social History Tobacco Use   Smoking status: Never   Smokeless tobacco: Never  Vaping Use   Vaping status: Never Used  Substance Use Topics   Alcohol use: Not Currently    Alcohol/week: 1.0 standard drink of alcohol    Types: 1 Standard drinks or equivalent per week    Comment: wine and beer on weekends   Drug use: Never  [2] No Known Allergies  "

## 2024-01-15 ENCOUNTER — Inpatient Hospital Stay

## 2024-01-25 ENCOUNTER — Encounter: Admitting: Family Medicine

## 2024-01-29 ENCOUNTER — Encounter: Admitting: Family Medicine

## 2024-02-02 ENCOUNTER — Inpatient Hospital Stay: Attending: Adult Health

## 2024-02-02 DIAGNOSIS — E538 Deficiency of other specified B group vitamins: Secondary | ICD-10-CM | POA: Insufficient documentation

## 2024-02-02 MED ORDER — CYANOCOBALAMIN 1000 MCG/ML IJ SOLN
1000.0000 ug | INTRAMUSCULAR | Status: DC
  Administered 2024-02-02: 1000 ug via INTRAMUSCULAR
  Filled 2024-02-02: qty 1

## 2024-02-05 ENCOUNTER — Encounter: Admitting: Family Medicine

## 2024-02-09 ENCOUNTER — Encounter: Payer: Self-pay | Admitting: Family Medicine

## 2024-02-09 ENCOUNTER — Ambulatory Visit: Admitting: Family Medicine

## 2024-02-09 VITALS — BP 112/72 | HR 65 | Temp 97.9°F | Ht 71.0 in | Wt 247.6 lb

## 2024-02-09 DIAGNOSIS — Z9884 Bariatric surgery status: Secondary | ICD-10-CM

## 2024-02-09 DIAGNOSIS — R635 Abnormal weight gain: Secondary | ICD-10-CM

## 2024-02-09 DIAGNOSIS — Z0000 Encounter for general adult medical examination without abnormal findings: Secondary | ICD-10-CM

## 2024-02-09 DIAGNOSIS — G8929 Other chronic pain: Secondary | ICD-10-CM

## 2024-02-09 DIAGNOSIS — Z6833 Body mass index (BMI) 33.0-33.9, adult: Secondary | ICD-10-CM

## 2024-02-09 LAB — CBC WITH DIFFERENTIAL/PLATELET
Absolute Lymphocytes: 2817 {cells}/uL (ref 850–3900)
Absolute Monocytes: 403 {cells}/uL (ref 200–950)
Basophils Absolute: 78 {cells}/uL (ref 0–200)
Basophils Relative: 1.4 %
Eosinophils Absolute: 498 {cells}/uL (ref 15–500)
Eosinophils Relative: 8.9 %
HCT: 34.3 % — ABNORMAL LOW (ref 35.9–46.0)
Hemoglobin: 10.9 g/dL — ABNORMAL LOW (ref 11.7–15.5)
MCH: 29.3 pg (ref 27.0–33.0)
MCHC: 31.8 g/dL (ref 31.6–35.4)
MCV: 92.2 fL (ref 81.4–101.7)
MPV: 10.5 fL (ref 7.5–12.5)
Monocytes Relative: 7.2 %
Neutro Abs: 1803 {cells}/uL (ref 1500–7800)
Neutrophils Relative %: 32.2 %
Platelets: 317 10*3/uL (ref 140–400)
RBC: 3.72 Million/uL — ABNORMAL LOW (ref 3.80–5.10)
RDW: 13.6 % (ref 11.0–15.0)
Total Lymphocyte: 50.3 %
WBC: 5.6 10*3/uL (ref 3.8–10.8)

## 2024-02-09 NOTE — Progress Notes (Signed)
 "  Established Patient Office Visit   Subjective  Patient ID: Brooke [name], female    DOB: February 15, 1965  Age: 59 y.o. MRN: [id number]  Chief Complaint  Patient presents with   Annual Exam    Patient is a 59 year old female seen for CPE.  Patient is not fasting.  Patient interested in weight loss medication as she has gained weight since last OFV.  Eating throughout the day.  May have a small breakfast, broccoli and cheese for lunch, and nuts later in the day.  Drinking mostly water .  Interested in Zepbound.  Previously seen at [facility] weight management but only lost 7 pounds.  Unable to exercise due to chronic right knee pain.  Trying chair exercises.  Needs right TKR but cannot have surgery until BMI is less than 30.  Last Pap with OB/GYN was 2023.    Patient Active Problem List   Diagnosis Date Noted   Atrial fibrillation with RVR (HCC) 02/04/2023   Status post open reduction and internal fixation (ORIF) of fracture 12/27/2022   Paroxysmal atrial fibrillation (HCC) 12/15/2022   Syncope 11/21/2022   H/O peripheral neuropathy 11/21/2022   Abnormal electrocardiogram (ECG) (EKG) 11/21/2022   B12 deficiency 07/24/2021   Essential hypertension 03/19/2021   Contusion of right foot 01/28/2020   Acute bronchitis 08/08/2019   Major depressive disorder, recurrent episode, mild 06/26/2019   GAD (generalized anxiety disorder) 06/26/2019   Alcohol use disorder, mild, abuse 06/26/2019   Contusion of right knee 05/01/2016   Atypical ductal hyperplasia of left breast 04/22/2015   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 04/14/2015   Genetic testing 03/09/2015   Malignant neoplasm of upper-inner quadrant of right breast in female, estrogen receptor positive (HCC) 02/18/2015   Generalized hyperhidrosis 06/23/2014   Chronic headaches    Past Medical History:  Diagnosis Date   Alcohol use disorder, mild, abuse 06/26/2019   Anemia    Anxiety    Breast cancer (HCC)    Breast cancer of  upper-inner quadrant of right female breast (HCC) 02/18/2015   Chronic headaches    Depression    Fatigue    Hot flashes    Hypertension    PAF (paroxysmal atrial fibrillation) (HCC) 12/08/2022   Syncope 11/2022   Yeast infection    Past Surgical History:  Procedure Laterality Date   BREAST IMPLANT REMOVAL Right 06/24/2022   Procedure: REMOVAL BREAST IMPLANT;  Surgeon: Arelia Filippo, MD;  Location: [facility];  Service: Plastics;  Laterality: Right;   BREAST LUMPECTOMY Left 2017   BREAST LUMPECTOMY WITH RADIOACTIVE SEED LOCALIZATION Left 04/06/2015   BREAST LUMPECTOMY WITH RADIOACTIVE SEED LOCALIZATION Left 04/06/2015   Procedure: LEFT BREAST LUMPECTOMY WITH RADIOACTIVE SEED LOCALIZATION;  Surgeon: Alm Angle, MD;  Location: [facility] OR;  Service: General;  Laterality: Left;   BREAST RECONSTRUCTION WITH PLACEMENT OF TISSUE EXPANDER AND FLEX HD (ACELLULAR HYDRATED DERMIS) Right 04/06/2015   Procedure: RIGHT BREAST RECONSTRUCTION WITH PLACEMENT OF TISSUE EXPANDER, POSSIBLE ACELLULAR DERMIS;  Surgeon: Filippo Arelia, MD;  Location: [facility] OR;  Service: Plastics;  Laterality: Right;   BREAST REDUCTION SURGERY Left 08/03/2015   Procedure: LEFT BREAST REDUCTION FOR SYMMETRY  (BREAST);  Surgeon: Filippo Arelia, MD;  Location: [facility];  Service: Plastics;  Laterality: Left;   CAPSULECTOMY Right 06/24/2022   Procedure: CAPSULECTOMY;  Surgeon: Arelia Filippo, MD;  Location: [facility];  Service: Plastics;  Laterality: Right;   CHOLECYSTECTOMY     COSMETIC SURGERY  GASTRIC BYPASS     LIPOSUCTION WITH LIPOFILLING N/A 08/03/2015   Procedure: LIPOSUCTION WITH LIPOFILLING ;  Surgeon: Earlis Ranks, MD;  Location: [facility];  Service: Plastics;  Laterality: N/A;   MASTECTOMY Right 2017   MASTECTOMY W/ SENTINEL NODE BIOPSY Right    MASTECTOMY W/ SENTINEL NODE BIOPSY Right 04/06/2015   Procedure: RIGHT MASTECTOMY WITH SENTINEL  LYMPH NODE BIOPSY;  Surgeon: Alm Angle, MD;  Location: [facility];  Service: General;  Laterality: Right;   OPEN REDUCTION INTERNAL FIXATION (ORIF) TIBIA/FIBULA FRACTURE Left 12/27/2022   Procedure: OPEN REDUCTION INTERNAL FIXATION (ORIF) TIBIA/FIBULA FRACTURE, OPEN TREATMENT OF SYNDESMOSIS;  Surgeon: Elsa Lonni SAUNDERS, MD;  Location: [facility] OR;  Service: Orthopedics;  Laterality: Left;   REDUCTION MAMMAPLASTY Left 2017   REMOVAL OF BILATERAL TISSUE EXPANDERS WITH PLACEMENT OF BILATERAL BREAST IMPLANTS Right 08/03/2015   Procedure: REMOVAL OF RIGHT TISSUE EXPANDERS WITH PLACEMENT OF IMPLANT;  Surgeon: Earlis Ranks, MD;  Location: [facility];  Service: Plastics;  Laterality: Right;   TUBAL LIGATION     Social History[1] Family History  Problem Relation Age of Onset   Diabetes Mother    Hypertension Mother    Spinal muscular atrophy Mother    Irregular heart beat Mother    Anxiety disorder Mother    Arthritis Mother    Cancer Other        maternal great grandfather dx. unspecified type cancer   Heart attack Maternal Grandmother    Heart attack Maternal Uncle    Cancer Cousin        paternal 1st cousin dx. with cancer that was typically a childhood cancer   Breast cancer Neg Hx    Allergies[2]  ROS Negative unless stated above    Objective:     BP 112/72 (BP Location: Left Arm, Patient Position: Sitting, Cuff Size: Large)   Pulse 65   Temp 97.9 F (36.6 C) (Oral)   Ht 5' 11 (1.803 m)   Wt 247 lb 9.6 oz (112.3 kg)   LMP 04/05/2016   SpO2 100%   BMI 34.53 kg/m  BP Readings from Last 3 Encounters:  02/09/24 112/72  01/12/24 118/62  10/27/23 106/60   Wt Readings from Last 3 Encounters:  02/09/24 247 lb 9.6 oz (112.3 kg)  01/12/24 240 lb 9.6 oz (109.1 kg)  10/27/23 223 lb (101.2 kg)      Physical Exam Constitutional:      Appearance: Normal appearance.  HENT:     Head: Normocephalic and atraumatic.     Right Ear: Tympanic membrane, ear canal and  external ear normal.     Left Ear: Tympanic membrane, ear canal and external ear normal.     Nose: Nose normal.     Mouth/Throat:     Mouth: Mucous membranes are moist.     Pharynx: No oropharyngeal exudate or posterior oropharyngeal erythema.  Eyes:     General: No scleral icterus.    Extraocular Movements: Extraocular movements intact.     Conjunctiva/sclera: Conjunctivae normal.     Pupils: Pupils are equal, round, and reactive to light.  Neck:     Thyroid : No thyromegaly.     Vascular: No carotid bruit.  Cardiovascular:     Rate and Rhythm: Normal rate and regular rhythm.     Pulses: Normal pulses.     Heart sounds: Normal heart sounds. No murmur heard.    No friction rub.  Pulmonary:     Effort: Pulmonary effort is normal.  Breath sounds: Normal breath sounds. No wheezing, rhonchi or rales.  Abdominal:     General: Bowel sounds are normal.     Palpations: Abdomen is soft.     Tenderness: There is no abdominal tenderness.  Musculoskeletal:        General: No deformity. Normal range of motion.  Lymphadenopathy:     Cervical: No cervical adenopathy.  Skin:    General: Skin is warm and dry.     Findings: No lesion.  Neurological:     General: No focal deficit present.     Mental Status: She is alert and oriented to person, place, and time.  Psychiatric:        Mood and Affect: Mood normal.        Thought Content: Thought content normal.        02/09/2024    3:50 PM 01/12/2024    4:00 PM 09/05/2023    2:04 PM  Depression screen PHQ 2/9  Decreased Interest 1 0 0  Down, Depressed, Hopeless 2 0 0  PHQ - 2 Score 3 0 0  Altered sleeping 3 1   Tired, decreased energy 3 1   Change in appetite 3 1   Feeling bad or failure about yourself  1 0   Trouble concentrating 1 0   Moving slowly or fidgety/restless 0 0   Suicidal thoughts 0 0   PHQ-9 Score 14 3   Difficult doing work/chores Somewhat difficult        02/09/2024    3:50 PM 01/12/2024    4:00 PM  GAD 7 :  Generalized Anxiety Score  Nervous, Anxious, on Edge 1 0   Control/stop worrying 1 0   Worry too much - different things 1 1   Trouble relaxing 1 0   Restless 0 0   Easily annoyed or irritable 1 1   Afraid - awful might happen 0 0   Total GAD 7 Score 5 2  Anxiety Difficulty Somewhat difficult Not difficult at all     Data saved with a previous flowsheet row definition     No results found for any visits on 02/09/24.    Assessment & Plan:   Well adult exam -     CBC with Differential/Platelet; Future -     Comprehensive metabolic panel with GFR; Future -     Hemoglobin A1c; Future -     Lipid panel; Future -     T4, free; Future -     Vitamin B12; Future -     VITAMIN D  25 Hydroxy (Vit-D Deficiency, Fractures); Future -     TSH; Future  Weight gain -     TSH; Future  Chronic pain of right knee  Class 1 obesity with serious comorbidity and body mass index (BMI) of 33.0 to 33.9 in adult, unspecified obesity type -     Hemoglobin A1c; Future -     T4, free; Future -     Vitamin B12; Future -     VITAMIN D  25 Hydroxy (Vit-D Deficiency, Fractures); Future  History of gastric bypass -     Vitamin B12; Future -     VITAMIN D  25 Hydroxy (Vit-D Deficiency, Fractures); Future -     TSH; Future  Age-appropriate health screenings discussed.  Consider influenza vaccine.  Patient declines at this time.  Mammogram done 05/17/2023.  Pap done 08/11/2021 with OB/GYN.  PHQ-9 score 14, GAD-7 score 5 this visit.  Consider counseling.  Monitor closely.  Body mass index is 34.53 kg/m.  In order to have knee surgery pt needs BMI 30 or less.  Discussed the importance of lifestyle modifications to prevent further weight gain.  Increase protein intake.  Eat more frequent meals throughout the day.  Will review labs then make a decision regarding GLP-1's.  Patient may benefit from an accountability of healthy weight clinic and nutrition support.  Daily multivitamin 2/2 history of gastric  bypass.  Return in about 3 months (around 05/08/2024) for chronic conditions.   Brooke JONELLE Single, MD      [1]  Social History Tobacco Use   Smoking status: Never   Smokeless tobacco: Never  Vaping Use   Vaping status: Never Used  Substance Use Topics   Alcohol use: Not Currently    Alcohol/week: 1.0 standard drink of alcohol    Comment: wine and beer on weekends   Drug use: Never  [2] No Known Allergies  "

## 2024-02-12 ENCOUNTER — Inpatient Hospital Stay

## 2024-03-01 ENCOUNTER — Inpatient Hospital Stay

## 2024-03-12 ENCOUNTER — Inpatient Hospital Stay

## 2024-03-12 ENCOUNTER — Inpatient Hospital Stay: Admitting: Adult Health

## 2024-03-29 ENCOUNTER — Inpatient Hospital Stay

## 2024-05-08 ENCOUNTER — Ambulatory Visit: Admitting: Family Medicine
